# Patient Record
Sex: Female | Born: 1940 | Race: White | Hispanic: No | Marital: Married | Smoking: Never smoker
Health system: Southern US, Community
[De-identification: ages and names within clinical notes are randomized; demographics above are authoritative.]

## PROBLEM LIST (undated history)

## (undated) DIAGNOSIS — D649 Anemia, unspecified: Secondary | ICD-10-CM

## (undated) DIAGNOSIS — K8020 Calculus of gallbladder without cholecystitis without obstruction: Secondary | ICD-10-CM

## (undated) DIAGNOSIS — E785 Hyperlipidemia, unspecified: Secondary | ICD-10-CM

## (undated) DIAGNOSIS — K219 Gastro-esophageal reflux disease without esophagitis: Secondary | ICD-10-CM

## (undated) DIAGNOSIS — E079 Disorder of thyroid, unspecified: Secondary | ICD-10-CM

## (undated) DIAGNOSIS — I872 Venous insufficiency (chronic) (peripheral): Secondary | ICD-10-CM

## (undated) DIAGNOSIS — K838 Other specified diseases of biliary tract: Secondary | ICD-10-CM

## (undated) DIAGNOSIS — F419 Anxiety disorder, unspecified: Secondary | ICD-10-CM

## (undated) DIAGNOSIS — E039 Hypothyroidism, unspecified: Secondary | ICD-10-CM

## (undated) DIAGNOSIS — I10 Essential (primary) hypertension: Secondary | ICD-10-CM

## (undated) DIAGNOSIS — K8689 Other specified diseases of pancreas: Secondary | ICD-10-CM

## (undated) DIAGNOSIS — K760 Fatty (change of) liver, not elsewhere classified: Secondary | ICD-10-CM

## (undated) DIAGNOSIS — G709 Myoneural disorder, unspecified: Secondary | ICD-10-CM

## (undated) DIAGNOSIS — K869 Disease of pancreas, unspecified: Secondary | ICD-10-CM

## (undated) DIAGNOSIS — M6530 Trigger finger, unspecified finger: Secondary | ICD-10-CM

## (undated) DIAGNOSIS — I81 Portal vein thrombosis: Secondary | ICD-10-CM

## (undated) DIAGNOSIS — M1990 Unspecified osteoarthritis, unspecified site: Secondary | ICD-10-CM

## (undated) DIAGNOSIS — T7840XA Allergy, unspecified, initial encounter: Secondary | ICD-10-CM

## (undated) DIAGNOSIS — N1330 Unspecified hydronephrosis: Secondary | ICD-10-CM

## (undated) DIAGNOSIS — C801 Malignant (primary) neoplasm, unspecified: Secondary | ICD-10-CM

## (undated) HISTORY — DX: Essential (primary) hypertension: I10

## (undated) HISTORY — DX: Venous insufficiency (chronic) (peripheral): I87.2

## (undated) HISTORY — DX: Portal vein thrombosis: I81

## (undated) HISTORY — DX: Hyperlipidemia, unspecified: E78.5

## (undated) HISTORY — DX: Other specified diseases of pancreas: K86.89

## (undated) HISTORY — DX: Other specified diseases of biliary tract: K83.8

## (undated) HISTORY — DX: Fatty (change of) liver, not elsewhere classified: K76.0

## (undated) HISTORY — DX: Trigger finger, unspecified finger: M65.30

## (undated) HISTORY — DX: Unspecified hydronephrosis: N13.30

## (undated) HISTORY — PX: BREAST BIOPSY: SHX20

## (undated) HISTORY — DX: Disease of pancreas, unspecified: K86.9

## (undated) HISTORY — DX: Disorder of thyroid, unspecified: E07.9

## (undated) HISTORY — DX: Malignant (primary) neoplasm, unspecified: C80.1

## (undated) HISTORY — DX: Morbid (severe) obesity due to excess calories: E66.01

## (undated) HISTORY — PX: LUMBAR LAMINECTOMY: SHX95

## (undated) HISTORY — DX: Calculus of gallbladder without cholecystitis without obstruction: K80.20

## (undated) HISTORY — DX: Gastro-esophageal reflux disease without esophagitis: K21.9

---

## 1997-11-17 ENCOUNTER — Encounter: Payer: Self-pay | Admitting: Urology

## 1997-11-17 ENCOUNTER — Observation Stay (HOSPITAL_COMMUNITY): Admission: EM | Admit: 1997-11-17 | Discharge: 1997-11-18 | Payer: Self-pay | Admitting: Emergency Medicine

## 1997-11-26 ENCOUNTER — Encounter: Payer: Self-pay | Admitting: Urology

## 1997-11-26 ENCOUNTER — Ambulatory Visit (HOSPITAL_COMMUNITY): Admission: RE | Admit: 1997-11-26 | Discharge: 1997-11-26 | Payer: Self-pay | Admitting: Urology

## 1998-05-24 ENCOUNTER — Other Ambulatory Visit: Admission: RE | Admit: 1998-05-24 | Discharge: 1998-05-24 | Payer: Self-pay | Admitting: *Deleted

## 1999-04-21 ENCOUNTER — Ambulatory Visit (HOSPITAL_COMMUNITY): Admission: RE | Admit: 1999-04-21 | Discharge: 1999-04-21 | Payer: Self-pay | Admitting: Internal Medicine

## 1999-07-04 ENCOUNTER — Encounter: Admission: RE | Admit: 1999-07-04 | Discharge: 1999-10-02 | Payer: Self-pay | Admitting: Internal Medicine

## 1999-07-08 ENCOUNTER — Other Ambulatory Visit: Admission: RE | Admit: 1999-07-08 | Discharge: 1999-07-08 | Payer: Self-pay | Admitting: *Deleted

## 1999-12-21 ENCOUNTER — Encounter: Admission: RE | Admit: 1999-12-21 | Discharge: 2000-03-20 | Payer: Self-pay | Admitting: Internal Medicine

## 2000-07-25 ENCOUNTER — Other Ambulatory Visit: Admission: RE | Admit: 2000-07-25 | Discharge: 2000-07-25 | Payer: Self-pay | Admitting: *Deleted

## 2000-07-25 ENCOUNTER — Encounter (INDEPENDENT_AMBULATORY_CARE_PROVIDER_SITE_OTHER): Payer: Self-pay

## 2000-09-17 ENCOUNTER — Encounter: Admission: RE | Admit: 2000-09-17 | Discharge: 2000-12-16 | Payer: Self-pay | Admitting: Internal Medicine

## 2000-12-26 ENCOUNTER — Encounter: Admission: RE | Admit: 2000-12-26 | Discharge: 2001-03-26 | Payer: Self-pay | Admitting: Internal Medicine

## 2001-08-14 ENCOUNTER — Other Ambulatory Visit: Admission: RE | Admit: 2001-08-14 | Discharge: 2001-08-14 | Payer: Self-pay | Admitting: *Deleted

## 2001-12-16 ENCOUNTER — Encounter: Admission: RE | Admit: 2001-12-16 | Discharge: 2001-12-16 | Payer: Self-pay | Admitting: Neurosurgery

## 2001-12-16 ENCOUNTER — Encounter: Payer: Self-pay | Admitting: Neurosurgery

## 2002-08-28 ENCOUNTER — Other Ambulatory Visit: Admission: RE | Admit: 2002-08-28 | Discharge: 2002-08-28 | Payer: Self-pay | Admitting: *Deleted

## 2002-11-17 ENCOUNTER — Encounter: Admission: RE | Admit: 2002-11-17 | Discharge: 2003-02-15 | Payer: Self-pay | Admitting: Internal Medicine

## 2003-10-08 ENCOUNTER — Other Ambulatory Visit: Admission: RE | Admit: 2003-10-08 | Discharge: 2003-10-08 | Payer: Self-pay | Admitting: *Deleted

## 2004-01-04 ENCOUNTER — Ambulatory Visit: Payer: Self-pay | Admitting: Internal Medicine

## 2004-01-08 ENCOUNTER — Ambulatory Visit: Payer: Self-pay | Admitting: Internal Medicine

## 2004-04-25 ENCOUNTER — Ambulatory Visit: Payer: Self-pay | Admitting: Internal Medicine

## 2004-08-29 ENCOUNTER — Ambulatory Visit: Payer: Self-pay | Admitting: Pulmonary Disease

## 2004-08-30 ENCOUNTER — Ambulatory Visit: Payer: Self-pay | Admitting: Pulmonary Disease

## 2004-09-05 ENCOUNTER — Ambulatory Visit: Payer: Self-pay

## 2004-09-06 ENCOUNTER — Ambulatory Visit: Payer: Self-pay | Admitting: Pulmonary Disease

## 2004-09-12 ENCOUNTER — Ambulatory Visit: Payer: Self-pay | Admitting: Internal Medicine

## 2004-09-14 ENCOUNTER — Ambulatory Visit: Payer: Self-pay | Admitting: Cardiology

## 2004-09-16 ENCOUNTER — Encounter: Admission: RE | Admit: 2004-09-16 | Discharge: 2004-12-15 | Payer: Self-pay | Admitting: Internal Medicine

## 2004-09-20 ENCOUNTER — Inpatient Hospital Stay (HOSPITAL_BASED_OUTPATIENT_CLINIC_OR_DEPARTMENT_OTHER): Admission: RE | Admit: 2004-09-20 | Discharge: 2004-09-20 | Payer: Self-pay | Admitting: Cardiology

## 2004-09-20 ENCOUNTER — Ambulatory Visit: Payer: Self-pay | Admitting: Cardiology

## 2004-09-27 ENCOUNTER — Ambulatory Visit: Payer: Self-pay | Admitting: Internal Medicine

## 2004-10-05 ENCOUNTER — Ambulatory Visit: Payer: Self-pay | Admitting: Cardiology

## 2004-10-25 ENCOUNTER — Ambulatory Visit: Payer: Self-pay | Admitting: Internal Medicine

## 2004-11-04 ENCOUNTER — Ambulatory Visit: Payer: Self-pay | Admitting: Internal Medicine

## 2004-11-25 ENCOUNTER — Other Ambulatory Visit: Admission: RE | Admit: 2004-11-25 | Discharge: 2004-11-25 | Payer: Self-pay | Admitting: *Deleted

## 2004-12-08 ENCOUNTER — Ambulatory Visit: Payer: Self-pay | Admitting: Internal Medicine

## 2005-01-20 ENCOUNTER — Ambulatory Visit: Payer: Self-pay | Admitting: Internal Medicine

## 2005-02-27 ENCOUNTER — Ambulatory Visit: Payer: Self-pay | Admitting: Pulmonary Disease

## 2005-03-31 ENCOUNTER — Ambulatory Visit: Payer: Self-pay | Admitting: Internal Medicine

## 2005-06-02 ENCOUNTER — Ambulatory Visit: Payer: Self-pay | Admitting: Internal Medicine

## 2005-07-11 ENCOUNTER — Encounter: Admission: RE | Admit: 2005-07-11 | Discharge: 2005-07-11 | Payer: Self-pay | Admitting: *Deleted

## 2005-07-12 ENCOUNTER — Ambulatory Visit: Payer: Self-pay | Admitting: Internal Medicine

## 2005-07-14 ENCOUNTER — Encounter: Admission: RE | Admit: 2005-07-14 | Discharge: 2005-07-14 | Payer: Self-pay | Admitting: *Deleted

## 2005-10-04 ENCOUNTER — Ambulatory Visit: Payer: Self-pay | Admitting: Internal Medicine

## 2005-11-10 ENCOUNTER — Ambulatory Visit: Payer: Self-pay | Admitting: Internal Medicine

## 2005-12-13 ENCOUNTER — Ambulatory Visit: Payer: Self-pay | Admitting: Internal Medicine

## 2005-12-29 ENCOUNTER — Ambulatory Visit: Payer: Self-pay | Admitting: Internal Medicine

## 2006-01-05 ENCOUNTER — Ambulatory Visit: Payer: Self-pay | Admitting: Internal Medicine

## 2006-03-09 ENCOUNTER — Ambulatory Visit: Payer: Self-pay | Admitting: Internal Medicine

## 2006-04-13 ENCOUNTER — Ambulatory Visit: Payer: Self-pay | Admitting: Internal Medicine

## 2006-04-13 LAB — CONVERTED CEMR LAB
Alkaline Phosphatase: 66 units/L (ref 39–117)
Cholesterol: 231 mg/dL (ref 0–200)
HDL: 41.6 mg/dL (ref >39.0)
Triglycerides: 224 mg/dL (ref 0–149)
VLDL: 45 mg/dL — ABNORMAL HIGH (ref 0–40)

## 2006-06-20 ENCOUNTER — Encounter: Admission: RE | Admit: 2006-06-20 | Discharge: 2006-06-20 | Payer: Self-pay | Admitting: Internal Medicine

## 2006-07-26 ENCOUNTER — Ambulatory Visit: Payer: Self-pay | Admitting: Internal Medicine

## 2006-07-26 LAB — CONVERTED CEMR LAB
AST: 23 units/L (ref 0–37)
Albumin: 4 g/dL (ref 3.5–5.2)
CRP, High Sensitivity: 1 (ref 0.00–5.00)
Hgb A1c MFr Bld: 7.2 % — ABNORMAL HIGH (ref 4.6–6.0)
LDL Cholesterol: 89 mg/dL (ref 0–99)
Total Protein: 6.7 g/dL (ref 6.0–8.3)

## 2006-08-01 ENCOUNTER — Encounter: Admission: RE | Admit: 2006-08-01 | Discharge: 2006-08-01 | Payer: Self-pay | Admitting: Internal Medicine

## 2006-09-27 ENCOUNTER — Ambulatory Visit: Payer: Self-pay | Admitting: Internal Medicine

## 2006-10-04 ENCOUNTER — Ambulatory Visit: Payer: Self-pay

## 2006-12-14 ENCOUNTER — Ambulatory Visit (HOSPITAL_BASED_OUTPATIENT_CLINIC_OR_DEPARTMENT_OTHER): Admission: RE | Admit: 2006-12-14 | Discharge: 2006-12-14 | Payer: Self-pay | Admitting: Orthopedic Surgery

## 2006-12-24 DIAGNOSIS — M6530 Trigger finger, unspecified finger: Secondary | ICD-10-CM | POA: Insufficient documentation

## 2006-12-24 DIAGNOSIS — K219 Gastro-esophageal reflux disease without esophagitis: Secondary | ICD-10-CM

## 2006-12-24 DIAGNOSIS — E119 Type 2 diabetes mellitus without complications: Secondary | ICD-10-CM

## 2006-12-24 DIAGNOSIS — I872 Venous insufficiency (chronic) (peripheral): Secondary | ICD-10-CM | POA: Insufficient documentation

## 2006-12-24 DIAGNOSIS — E785 Hyperlipidemia, unspecified: Secondary | ICD-10-CM

## 2006-12-27 ENCOUNTER — Ambulatory Visit: Payer: Self-pay | Admitting: Internal Medicine

## 2006-12-27 LAB — CONVERTED CEMR LAB
CO2: 33 meq/L — ABNORMAL HIGH (ref 19–32)
Creatinine, Ser: 0.5 mg/dL (ref 0.4–1.2)
GFR calc Af Amer: 159 mL/min
Glucose, Bld: 148 mg/dL — ABNORMAL HIGH (ref 70–99)
Sodium: 143 meq/L (ref 135–145)

## 2006-12-28 ENCOUNTER — Ambulatory Visit (HOSPITAL_BASED_OUTPATIENT_CLINIC_OR_DEPARTMENT_OTHER): Admission: RE | Admit: 2006-12-28 | Discharge: 2006-12-28 | Payer: Self-pay | Admitting: Orthopedic Surgery

## 2007-04-15 ENCOUNTER — Telehealth (INDEPENDENT_AMBULATORY_CARE_PROVIDER_SITE_OTHER): Payer: Self-pay | Admitting: *Deleted

## 2007-04-21 ENCOUNTER — Encounter: Admission: RE | Admit: 2007-04-21 | Discharge: 2007-04-21 | Payer: Self-pay | Admitting: Orthopedic Surgery

## 2007-05-09 ENCOUNTER — Telehealth (INDEPENDENT_AMBULATORY_CARE_PROVIDER_SITE_OTHER): Payer: Self-pay | Admitting: *Deleted

## 2007-08-19 ENCOUNTER — Encounter: Admission: RE | Admit: 2007-08-19 | Discharge: 2007-08-19 | Payer: Self-pay | Admitting: Internal Medicine

## 2007-09-16 ENCOUNTER — Encounter: Payer: Self-pay | Admitting: Internal Medicine

## 2007-10-18 ENCOUNTER — Ambulatory Visit: Payer: Self-pay | Admitting: Internal Medicine

## 2007-10-18 DIAGNOSIS — J029 Acute pharyngitis, unspecified: Secondary | ICD-10-CM

## 2007-10-18 DIAGNOSIS — E1149 Type 2 diabetes mellitus with other diabetic neurological complication: Secondary | ICD-10-CM | POA: Insufficient documentation

## 2007-10-24 ENCOUNTER — Ambulatory Visit: Payer: Self-pay | Admitting: Internal Medicine

## 2007-10-24 DIAGNOSIS — J069 Acute upper respiratory infection, unspecified: Secondary | ICD-10-CM | POA: Insufficient documentation

## 2007-11-08 ENCOUNTER — Telehealth: Payer: Self-pay | Admitting: Adult Health

## 2007-11-20 ENCOUNTER — Ambulatory Visit: Payer: Self-pay | Admitting: Internal Medicine

## 2007-11-20 DIAGNOSIS — J209 Acute bronchitis, unspecified: Secondary | ICD-10-CM | POA: Insufficient documentation

## 2007-12-17 ENCOUNTER — Encounter: Payer: Self-pay | Admitting: Internal Medicine

## 2008-01-14 ENCOUNTER — Ambulatory Visit: Payer: Self-pay | Admitting: Internal Medicine

## 2008-01-14 DIAGNOSIS — J984 Other disorders of lung: Secondary | ICD-10-CM

## 2008-01-14 DIAGNOSIS — G4700 Insomnia, unspecified: Secondary | ICD-10-CM | POA: Insufficient documentation

## 2008-01-14 DIAGNOSIS — I10 Essential (primary) hypertension: Secondary | ICD-10-CM | POA: Insufficient documentation

## 2008-01-28 ENCOUNTER — Telehealth (INDEPENDENT_AMBULATORY_CARE_PROVIDER_SITE_OTHER): Payer: Self-pay | Admitting: *Deleted

## 2008-02-18 ENCOUNTER — Encounter: Payer: Self-pay | Admitting: Internal Medicine

## 2008-02-18 ENCOUNTER — Ambulatory Visit: Payer: Self-pay | Admitting: Internal Medicine

## 2008-03-16 ENCOUNTER — Telehealth (INDEPENDENT_AMBULATORY_CARE_PROVIDER_SITE_OTHER): Payer: Self-pay | Admitting: *Deleted

## 2008-09-11 ENCOUNTER — Telehealth: Payer: Self-pay | Admitting: Internal Medicine

## 2008-09-21 ENCOUNTER — Encounter: Payer: Self-pay | Admitting: Internal Medicine

## 2008-10-05 ENCOUNTER — Ambulatory Visit: Payer: Self-pay | Admitting: Internal Medicine

## 2009-01-08 ENCOUNTER — Encounter: Admission: RE | Admit: 2009-01-08 | Discharge: 2009-01-08 | Payer: Self-pay | Admitting: Endocrinology

## 2009-11-03 ENCOUNTER — Encounter: Admission: RE | Admit: 2009-11-03 | Discharge: 2009-11-03 | Payer: Self-pay | Admitting: Orthopedic Surgery

## 2009-11-24 ENCOUNTER — Ambulatory Visit (HOSPITAL_BASED_OUTPATIENT_CLINIC_OR_DEPARTMENT_OTHER): Admission: RE | Admit: 2009-11-24 | Discharge: 2009-11-24 | Payer: Self-pay | Admitting: Orthopedic Surgery

## 2009-12-20 ENCOUNTER — Encounter: Payer: Self-pay | Admitting: Internal Medicine

## 2010-01-03 ENCOUNTER — Encounter: Payer: Self-pay | Admitting: Internal Medicine

## 2010-01-10 ENCOUNTER — Ambulatory Visit: Payer: Self-pay | Admitting: Internal Medicine

## 2010-01-10 ENCOUNTER — Encounter: Admission: RE | Admit: 2010-01-10 | Discharge: 2010-01-10 | Payer: Self-pay | Admitting: Internal Medicine

## 2010-01-10 DIAGNOSIS — R0609 Other forms of dyspnea: Secondary | ICD-10-CM

## 2010-01-10 DIAGNOSIS — R0989 Other specified symptoms and signs involving the circulatory and respiratory systems: Secondary | ICD-10-CM

## 2010-01-14 ENCOUNTER — Telehealth: Payer: Self-pay | Admitting: Internal Medicine

## 2010-01-31 ENCOUNTER — Ambulatory Visit: Payer: Self-pay | Admitting: Internal Medicine

## 2010-03-13 ENCOUNTER — Encounter: Payer: Self-pay | Admitting: *Deleted

## 2010-03-21 ENCOUNTER — Encounter: Payer: Self-pay | Admitting: Internal Medicine

## 2010-03-22 NOTE — Progress Notes (Signed)
Summary: COUGH/ CONGESTION/ > tramadol prn  Phone Note Call from Patient Call back at [phone]   Caller: Patient Call For: WERT Summary of Call: COUGH CONGESTION AND [facility] [phone] Initial call taken by: Rickard Patience,  January 14, 2010 10:37 AM  Follow-up for Phone Call        [id number]. Carron Curie CMA  January 14, 2010 10:48 AM  Pt saw MW on Monday 01-10-10 and was given augmentin and pred taper. Pt states she is no better in fact wheezing is worse, still SOB, and dry cough that is "constant" I advised pt she needs to finishe treatment to get full benefit, she will finsihe abx tomorrow. She states she does not want to go the weekend and still be sick and wants MW recs. Please advise.Carron Curie CMA  January 14, 2010 11:04 AM Take delsym two tsp every 12 hours and add tramadol 50 mg up to 2every 4 hours to suppress the urge to cough. Swallowing water or using ice chips/non mint and menthol containing candies (such as lifesavers or sugarless jolly ranchers) are also effective.  RX for # 40 tramadol  1-2 every 4 hours as needed  Follow-up by: Nyoka Cowden MD,  January 14, 2010 12:01 PM  Additional Follow-up for Phone Call Additional follow up Details #1::        pt advised of recs and rx sent. Carron Curie CMA  January 14, 2010 12:07 PM     New/Updated Medications: TRAMADOL HCL 50 MG TABS (TRAMADOL HCL) 1-2 tablets every 4 hours as needed Prescriptions: TRAMADOL HCL 50 MG TABS (TRAMADOL HCL) 1-2 tablets every 4 hours as needed  #40 x 0   Entered by:   Carron Curie CMA   Authorized by:   Nyoka Cowden MD   Signed by:   Carron Curie CMA on 01/14/2010   Method used:   Electronically to        [facility]* (retail)       [address]       [place], [place]  [address]       Ph: [phone]       Fax: [phone]   RxID:   [id number]

## 2010-03-22 NOTE — Letter (Signed)
Summary: [facility]   Imported By: Lester Arbovale 01/14/2010 11:10:42  _____________________________________________________________________  External Attachment:    Type:   Image     Comment:   External Document

## 2010-03-22 NOTE — Assessment & Plan Note (Signed)
Summary: Pulmonary/ acute ext ov for sob and new sinus   Primary Provider/Referring Provider:  [name]  CC:  c/o cough-dry x 3-4 days, increased SOB, sorethroat, wheezing, and no fcs.  History of Present Illness: 59 yowf  never smoker with morbid obesity complicated by diabetes and hyperlipidemia.  Her diabetes in turn has been complicated by painful paresthesias for which she has been treated previously with Lyric 75 in am 150 in pm  with adequate control  followed by Dr. Omer Jack, Endocrinology.   November 20, 2007--ov for  cough with talking, laughing and bedtime. Mainly dry, resolved with tessilon and then dc'd without relapse, cxr read by radiology as possible rul nodule.  January 14, 2008 ov multiple issues 1. 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 Sleeping worse more due to restless than paresthesia 3  Left hip pain Bednarz.  October 05, 2008 ov needs trazadone at  100mg  2 qm with option of 3 if needed (never does) no problem with children around her meds though she has 5 grand children now and doing lots of babysitting. denies depression and says sleeps great on the trazadone with good control of paresthesias on lyrica and vitamins.  rec no change in rx  January 10, 2010 cc  sob x 6 months with ex  and also with lying down  cough-dry x 3-4 days,increased SOB,sorethroat,wheezing, no fcs but stuffy nose, no purulent secretions. Pt denies any significant  dysphagia, itching, sneezing,   fever, chills, sweats, unintended wt loss, pleuritic or exertional cp, hempoptysis,  or change in chronic dep  leg swelling sym bilaterally  Preventive Screening-Counseling & Management  Alcohol-Tobacco     Smoking Status: never  Current Medications (verified): 1)  Nexium 40 Mg  Cpdr (Esomeprazole Magnesium) .... Take 1 Every Morning Before Breakfast 2)  Synthroid 88 Mcg Tabs (Levothyroxine Sodium) .... Take 1 Tablet By Mouth Every Day 3)  Klor-Con M20 20 Meq Cr-Tabs (Potassium Chloride  Crys Cr) .... Take 1 Tablet By Mouth Once A Day 4)  Benicar 40 Mg Tabs (Olmesartan Medoxomil) .... Take 1/2 Tablet By Mouth Every Morning 5)  Actos 45 Mg Tabs (Pioglitazone Hcl) .... Take 1 Tablet By Mouth Once A Day 6)  Biotin Maximum Strength 5000 Mcg Caps (Biotin) .... Take 1 Capsule By Mouth Once A Day 7)  Zocor 40 Mg  Tabs (Simvastatin) .... Take 1 By Mouth At Bedtime 8)  Furosemide 20 Mg  Tabs (Furosemide) .... Once Daily 9)  Trazodone Hcl 100 Mg Tabs (Trazodone Hcl) .... Take 2 At Bedtime 10)  Janumet 50-1000 Mg Tabs (Sitagliptin-Metformin Hcl) .... Take 1 Tablet By Mouth Two Times A Day 11)  Tekturna 300 Mg Tabs (Aliskiren Fumarate) .... Take 1 Tablet By Mouth Every Morning 12)  Claritin 10 Mg Tabs (Loratadine) .... As Needed 13)  Lyrica 75 Mg Caps (Pregabalin) .Marland Kitchen.. 1 Every Morning and 2 By Mouth At Bedtime 14)  Doxazosin Mesylate 8 Mg Tabs (Doxazosin Mesylate) .Marland Kitchen.. 1 Every Am 15)  Lexapro 10 Mg Tabs (Escitalopram Oxalate) .Marland Kitchen.. 1 Once Daily 16)  Clonazepam 0.5 Mg Tabs (Clonazepam) .... 1/2 At Bedtime 17)  Aspirin 81 Mg Tbec (Aspirin) .Marland Kitchen.. 1 Every Am 18)  Tylenol 325 Mg Tabs (Acetaminophen) .... Per Bottle Directions As Needed  Allergies (verified): 1)  ! * Zpak 2)  ! 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) HYPERLIPIDEMIA (ICD-272.4)..........................................Marland KitchenGegick DM (ICD-250.00)..............................................................Marland KitchenGegick GERD (ICD-530.81) VENOUS INSUFFICIENCY, CHRONIC (ICD-459.81) OBESITY, MORBID (ICD-278.01)  Health Maintenance............................................................Marland KitchenGegick     -DT  11/06     -Pneumovax 11/06     -DEXA  01/22/06 nl, repeat 02/16/08 also  nl     -CPX January 14, 2008   Vital Signs:  Patient profile:   70 year old female Height:      66 inches Weight:      250.25 pounds BMI:     40.54 O2 Sat:      95 % on Room air Temp:     98.1 degrees F  oral Pulse rate:   76 / minute BP sitting:   144 / 74  (left arm) Cuff size:   large  Vitals Entered By: Elray Buba RN (January 10, 2010 11:03 AM)  O2 Flow:  Room air CC: c/o cough-dry x 3-4 days,increased SOB,sorethroat,wheezing, no fcs Is Patient Diabetic? Yes Comments Medications reviewed with patient ,phone # verfied.Elray Buba RN  January 10, 2010 11:04 AM    Physical Exam  Additional Exam:  wt   218 January 14, 2008 > 229 October 05, 2008  > 250 January 10, 2010  Ambulatory mod obese wf  in no acute distress though with obvious severe nasal congestion HEENT: nl dentition,  and orophanx. Nl external ear canals without cough reflex, severe turbinate edema with mucoid discharge Neck without JVD/Nodes/TM/ no bruits Lungs clear to A and P bilaterally without cough on insp or exp maneuvers RRR no s3 or murmur or increase in P2 or displacement PMI,  no edema Abd soft and benign with nl excursion in the supine position. No bruits or organomegaly Ext warm no rash   Impression & Recommendations:  Problem # 1:  DYSPNEA (ICD-786.09) Chronic x months assoc with wt gain and leg swelling but nl cxr and bnp ? could this be an actos effect, consider d/c per Dr Dagoberto Ligas  Problem # 2:  UPPER RESPIRATORY INFECTION, ACUTE (ICD-465.9)  Her updated medication list for this problem includes:    Claritin 10 Mg Tabs (Loratadine) .Marland Kitchen... As needed    Aspirin 81 Mg Tbec (Aspirin) .Marland Kitchen... 1 every am    Tylenol 325 Mg Tabs (Acetaminophen) .Marland Kitchen... Per bottle directions as needed  Assoc with severe nasal congestion and ? sinusitis;  Explained natural h/o uri and why it's necessary in patients at risk to rx short term with PPI to reduce risk of evolving cyclical cough triggered by epithelial injury and a heightened sensitivty to the effects of any upper airway irritants,  most importantly acid - related  See instructions for specific recommendations     Medications Added to Medication List This  Visit: 1)  Nexium 40 Mg Cpdr (Esomeprazole magnesium) .... Take  one 30-60 min before first meal of the day 2)  Synthroid 88 Mcg Tabs (Levothyroxine sodium) .... Take 1 tablet by mouth every day 3)  Benicar 40 Mg Tabs (Olmesartan medoxomil) .... Take 1/2 tablet by mouth every morning 4)  Doxazosin Mesylate 8 Mg Tabs (Doxazosin mesylate) .Marland Kitchen.. 1 every am 5)  Lexapro 10 Mg Tabs (Escitalopram oxalate) .Marland Kitchen.. 1 once daily 6)  Clonazepam 0.5 Mg Tabs (Clonazepam) .... 1/2 at bedtime 7)  Aspirin 81 Mg Tbec (Aspirin) .Marland Kitchen.. 1 every am 8)  Tylenol 325 Mg Tabs (Acetaminophen) .... Per bottle directions as needed 9)  Pepcid 20 Mg Tabs (Famotidine) .... Take one by mouth at bedtime 10)  Prednisone 10 Mg Tabs (Prednisone) .... 4 each am x 2days, 2x2days, 1x2days and stop 11)  Augmentin 875-125 Mg Tabs (Amoxicillin-pot clavulanate) .... One twice daily with large glass of water  Other Orders: TLB-BNP (B-Natriuretic Peptide) (83880-BNPR) T-2 View CXR (71020TC) Est. Patient Level IV (11914)  Patient Instructions: 1)  GERD (REFLUX)  is a common cause of respiratory symptoms. It commonly presents without heartburn and can be treated with medication, but also with lifestyle changes including avoidance of late meals, excessive alcohol, smoking cessation, and avoid fatty foods, chocolate, peppermint, colas, red wine, and acidic juices such as orange juice. NO MINT OR MENTHOL PRODUCTS SO NO COUGH DROPS  2)  USE SUGARLESS CANDY INSTEAD (jolley ranchers)  3)  NO OIL BASED VITAMINS 4)  Augmentin one twice daily x 7days 5)  Add pepcid 20mg  one at bedtime 6)  Prednisone 10 mg 4 each am x 2days, 2x2days, 1x2days and stop  Prescriptions: AUGMENTIN 875-125 MG  TABS (AMOXICILLIN-POT CLAVULANATE) one twice daily with large glass of water  #14 x 0   Entered and Authorized by:   Nyoka Cowden MD   Signed by:   Nyoka Cowden MD on 01/10/2010   Method used:   Electronically to        [facility]* (retail)       [address]       [place], [place]  [address]       Ph: [phone]       Fax: [phone]   RxID:   [id number] PREDNISONE 10 MG  TABS (PREDNISONE) 4 each am x 2days, 2x2days, 1x2days and stop  #14 x 0   Entered and Authorized by:   Nyoka Cowden MD   Signed by:   Nyoka Cowden MD on 01/10/2010   Method used:   Electronically to        [facility]* (retail)       [address]       [place], [place]  [address]       Ph: [phone]       Fax: [phone]   RxID:   [id number]

## 2010-03-22 NOTE — Letter (Signed)
Summary: [facility]   Imported By: Lennie Odor 12/30/2009 10:59:17  _____________________________________________________________________  External Attachment:    Type:   Image     Comment:   External Document

## 2010-03-24 NOTE — Assessment & Plan Note (Signed)
Summary: Pulmonary/ ext f/u ov for sob/ cough   Primary Provider/Referring Provider:  [name]  CC:  SOB and Cough- some better.  History of Present Illness: 70 yowf  never smoker with morbid obesity complicated by diabetes and hyperlipidemia.  Her diabetes in turn has been complicated by painful paresthesias for which she has been treated previously with Lyric 75 in am 150 in pm  with adequate control  followed by Dr. Omer Jack, Endocrinology.   November 20, 2007--ov for  cough with talking, laughing and bedtime. Mainly dry, resolved with tessilon and then dc'd without relapse, cxr read by radiology as possible rul nodule.  January 14, 2008 ov multiple issues 1. 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 Sleeping worse more due to restless than paresthesia 3  Left hip pain Bednarz.  October 05, 2008 ov needs trazadone at  100mg  2 qm with option of 3 if needed (never does) no problem with children around her meds though she has 5 grand children now and doing lots of babysitting. denies depression and says sleeps great on the trazadone with good control of paresthesias on lyrica and vitamins.  rec no change in rx  January 10, 2010 cc  sob x 6 months with ex  and also with lying down  cough-dry x 3-4 days,increased SOB,sorethroat,wheezing, no fcs but stuffy nose, no purulent secretions.  rec Augmentin one twice daily x 7days > mucus turned clear Add pepcid 20mg  one at bedtime plus gerd diet Prednisone 10 mg 4 each am x 2days, 2x2days, 1x2days and stop > cough better   January 31, 2010 cc doe x  bending over,  limited by feet walking, can only do stationery bike x 10 min whereas used to do it x 30 at same speed and resistance. cough ok control without suppressants. Pt denies any significant sore throat, dysphagia, itching, sneezing,  nasal congestion or excess secretions,  fever, chills, sweats, unintended wt loss, pleuritic or exertional cp, hempoptysis,variability  in activity tolerance   orthopnea pnd or leg swelling. Pt also denies any obvious fluctuation in symptoms with weather or environmental change or other alleviating or aggravating factors.       Current Medications (verified): 1)  Nexium 40 Mg  Cpdr (Esomeprazole Magnesium) .... Take  One 30-60 Min Before First Meal of The Day 2)  Synthroid 88 Mcg Tabs (Levothyroxine Sodium) .... Take 1 Tablet By Mouth Every Day 3)  Klor-Con M20 20 Meq Cr-Tabs (Potassium Chloride Crys Cr) .... Take 1 Tablet By Mouth Once A Day 4)  Benicar 40 Mg Tabs (Olmesartan Medoxomil) .... Take 1/2 Tablet By Mouth Every Morning 5)  Actos 45 Mg Tabs (Pioglitazone Hcl) .... Take 1 Tablet By Mouth Once A Day 6)  Biotin Maximum Strength 5000 Mcg Caps (Biotin) .... Take 1 Capsule By Mouth Once A Day 7)  Zocor 40 Mg  Tabs (Simvastatin) .... Take 1 By Mouth At Bedtime 8)  Furosemide 20 Mg  Tabs (Furosemide) .... Once Daily 9)  Trazodone Hcl 100 Mg Tabs (Trazodone Hcl) .... Take 2 At Bedtime 10)  Janumet 50-1000 Mg Tabs (Sitagliptin-Metformin Hcl) .... Take 1 Tablet By Mouth Two Times A Day 11)  Tekturna 300 Mg Tabs (Aliskiren Fumarate) .... Take 1 Tablet By Mouth Every Morning 12)  Claritin 10 Mg Tabs (Loratadine) .... As Needed 13)  Lyrica 75 Mg Caps (Pregabalin) .Marland Kitchen.. 1 Every Morning and 2 By Mouth At Bedtime 14)  Doxazosin Mesylate 8 Mg Tabs (Doxazosin Mesylate) .Marland Kitchen.. 1 Every Am 15)  Lexapro  10 Mg Tabs (Escitalopram Oxalate) .Marland Kitchen.. 1 Once Daily 16)  Clonazepam 0.5 Mg Tabs (Clonazepam) .... 1/2 At Bedtime 17)  Aspirin 81 Mg Tbec (Aspirin) .Marland Kitchen.. 1 Every Am 18)  Tylenol 325 Mg Tabs (Acetaminophen) .... Per Bottle Directions As Needed 19)  Pepcid 20 Mg Tabs (Famotidine) .... Take One By Mouth At Bedtime 20)  Tramadol Hcl 50 Mg Tabs (Tramadol Hcl) .Marland Kitchen.. 1-2 Tablets At Bedtime As Needed For Cough  Allergies (verified): 1)  ! * Zpak 2)  ! 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) HYPERLIPIDEMIA  (ICD-272.4)..........................................Marland KitchenGegick DM (ICD-250.00)..............................................................Marland KitchenGegick GERD (ICD-530.81) VENOUS INSUFFICIENCY, CHRONIC (ICD-459.81) OBESITY, MORBID (ICD-278.01)  Health Maintenance............................................................Marland KitchenGegick     -DT  11/06     -Pneumovax 11/06     -DEXA  01/22/06 nl, repeat 02/16/08 also  nl  Vital Signs:  Patient profile:   70 year old female Weight:      243 pounds O2 Sat:      94 % on Room air Temp:     98.0 degrees F oral Pulse rate:   74 / minute BP sitting:   148 / 70  (left arm)  Vitals Entered By: Vernie Murders (January 31, 2010 9:56 AM)  O2 Flow:  Room air   Physical Exam  Additional Exam:  wt   218 January 14, 2008 > 229 October 05, 2008  > 250 January 10, 2010 > 243 January 31, 2010  Ambulatory mod obese wf  in no acute distress   HEENT: nl dentition,  and orophanx. Nl external ear canals without cough reflex, mild bilateral nasal turbinate swelling, no discharge Neck without JVD/Nodes/TM/ no bruits Lungs clear to A and P bilaterally without cough on insp or exp maneuvers RRR no s3 or murmur or increase in P2 or displacement PMI,  no edema Abd soft and benign with nl excursion in the supine position. No bruits or organomegaly Ext warm no rash  BNP 01/10/10  = 146  CXR  Procedure date:  01/10/2010  Findings:      Comparison: Chest 01/01/2019 4:09.   Findings: Heart size is normal.  Peribronchial thickening is unchanged.  No focal airspace disease or effusion.   IMPRESSION: No acute finding.  Stable compared to prior exam.    Impression & Recommendations:  Problem # 1:  DYSPNEA (ICD-786.09)  Her updated medication list for this problem includes:    Furosemide 20 Mg Tabs (Furosemide) ..... Once daily  Chronic x months assoc with wt gain and leg swelling but nl cxr and bnp ? could this be an actos effect, consider d/c per Dr  Dagoberto Ligas  Obseity with decondtioning is the most likely explanation.  Discussed in detail all the  indications, usual  risks and alternatives  relative to the benefits with patient who agrees to proceed with trial of paced exercise then consider return for cpst p holidays  Orders: Est. Patient Level IV (62130)  Problem # 2:  GERD (ICD-530.81)  Her updated medication list for this problem includes:    Nexium 40 Mg Cpdr (Esomeprazole magnesium) .Marland Kitchen... Take  one 30-60 min before first meal of the day    Pepcid 20 Mg Tabs (Famotidine) .Marland Kitchen... Take one by mouth at bedtime  cough resolved with rx of GERd.  Of the three most common causes of chronic cough, only one (GERD) can actually cause the other two and perpetuate the cylce of cough inducing airway trauma, inflammation, heightened sensitivity to reflux which is prompted by the cough  itself via a cyclical mechanism.  This may partially respond to steroids and look like asthma and post nasal drainage but never erradicated completely unless the cough and the secondary reflux are eliminated, preferably both at the same time.  rec maint rx discussed  Orders: Est. Patient Level IV (42706)  Medications Added to Medication List This Visit: 1)  Tramadol Hcl 50 Mg Tabs (Tramadol hcl) .Marland Kitchen.. 1-2 tablets at bedtime as needed for cough 2)  Famvir 500 Mg Tabs (Famciclovir) .... One twice daily as needed  Patient Instructions: 1)  GERD (REFLUX)  is a common cause of respiratory symptoms. It commonly presents without heartburn and can be treated with medication, but also with lifestyle changes including avoidance of late meals, excessive alcohol, smoking cessation, and avoid fatty foods, chocolate, peppermint, colas, red wine, and acidic juices such as orange juice. NO MINT OR MENTHOL PRODUCTS SO NO COUGH DROPS  2)  USE SUGARLESS CANDY INSTEAD (jolley ranchers)  3)  NO OIL BASED VITAMINS 4)  Get on  bike daily x 30 min pace it where you are slightly short of breath  but never out of breath and if not imroving after 2 weeks call [facility] at [phone] for CPST  5)  In meantime try off Actos if possible 6)  Famvir 500 one twice daily x 5 days if bad fever blister develops Prescriptions: FAMVIR 500 MG TABS (FAMCICLOVIR) one twice daily as needed  #10 x 11   Entered and Authorized by:   Nyoka Cowden MD   Signed by:   Nyoka Cowden MD on 01/31/2010   Method used:   Electronically to        [facility]* (retail)       [address]       [place], [place]  [address]       Ph: [phone]       Fax: [phone]   RxID:   [id number]

## 2010-03-24 NOTE — Letter (Signed)
Summary: [facility]  [facility]   Imported By: Sherian Rein 02/07/2010 14:01:16  _____________________________________________________________________  External Attachment:    Type:   Image     Comment:   External Document

## 2010-04-13 NOTE — Letter (Signed)
Summary: Sherri Rad MD  Sherri Rad MD   Imported By: Lester Wilkeson 04/06/2010 07:33:48  _____________________________________________________________________  External Attachment:    Type:   Image     Comment:   External Document

## 2010-05-05 LAB — POCT I-STAT 4, (NA,K, GLUC, HGB,HCT)
Glucose, Bld: 126 mg/dL — ABNORMAL HIGH (ref 70–99)
HCT: 44 % (ref 36.0–46.0)

## 2010-05-05 LAB — GLUCOSE, CAPILLARY: Glucose-Capillary: 123 mg/dL — ABNORMAL HIGH (ref 70–99)

## 2010-05-23 ENCOUNTER — Other Ambulatory Visit: Payer: Self-pay | Admitting: *Deleted

## 2010-05-23 NOTE — Telephone Encounter (Signed)
Last filled 08/27/2009.  Last OV 01/31/2010.  No future appts pending. Please advise for refills.

## 2010-05-23 NOTE — Telephone Encounter (Signed)
Ok to reflll x 1 year

## 2010-05-24 MED ORDER — TRAZODONE HCL 100 MG PO TABS: ORAL_TABLET | ORAL | Status: DC

## 2010-06-01 ENCOUNTER — Telehealth: Payer: Self-pay | Admitting: Internal Medicine

## 2010-06-01 NOTE — Telephone Encounter (Signed)
Forwarded to Dr. [name] for review.

## 2010-07-05 NOTE — Op Note (Signed)
NAMELACRESHA, FUSILIER                 ACCOUNT NO.:  [id number]   MEDICAL RECORD NO.:  [id number]           PATIENT TYPE:   LOCATION:                                 FACILITY:   PHYSICIAN:  Katy Fitch. Sypher, M.D. 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   OPERATION:  Release of right thumb A1 pulley under local anesthesia  sedation.   OPERATING SURGEON:  Katy Fitch. Sypher, M.D.   ASSISTANT:  Molly Maduro Dasnoit PA-C.   ANESTHESIA:  2% lidocaine flexor sheath block and metacarpal head-level  block of right thumb supplemented by IV sedation, supervising  anesthesiologist is W. Autumn Patty, MD.   INDICATIONS:  Donnis Pecha is a 69 year old woman referred by Dr. [name]  for evaluation and management of trigger thumbs.  She had release of her  left thumb A1 pulley and injection of the right thumb approximately 1  month prior.  She now presents for release of her right thumb A1 pulley.  After informed consent, she is brought to the operating room at this  time.   PROCEDURE:  Flossie Wexler is brought to the operating room and placed in  supine position upon the operating table.  Following sedation, the right  arm was prepped with Betadine soap and solution and sterilely draped.  A  pneumatic tourniquet was applied to the proximal right brachium.   Lidocaine 2% was infiltrated in the path of the intended incision and  into the right thumb flexor sheath.  After a few moments, anesthesia was  satisfactory.  The arm was then exsanguinated with an Esmarch bandage  and the arterial tourniquet inflated to 230 mmHg.  The procedure  commenced with a short transverse incision directly over the A1 pulley.  The subcutaneous tissues were carefully divided  taking care to identify  and retract the radial proper digital nerve of the thumb.  After  isolation of the A1 pulley, the pulley was split with scalpel and  scissors.  The flexor pollicis longus tendon was delivered and found be  intact.  Thereafter free range of motion of the IP joint was recovered.   The wound was then repaired with mattress suture of 5-0 nylon.  A  compressive dressing was applied with Xeroflo, sterile gauze and an Ace  wrap.  Ms. Scoles will be discharged home to the care of her family.  She is provided with a prescription for Darvocet-N 100 one p.o. q.4-6h.  p.r.n. pain, 20 tablets without refill.   We will see her back in follow-up in 1 week or sooner p.r.n. problems.      Katy Fitch Sypher, M.D.  Electronically Signed     RVS/MEDQ  D:  12/28/2006  T:  12/28/2006  Job:  [id number]   cc:   [name]. [name], MD, FCCP

## 2010-07-05 NOTE — Op Note (Signed)
NAMEKAYLEAN, TUPOU                 ACCOUNT NO.:  [id number]   MEDICAL RECORD NO.:  [id number]          PATIENT TYPE:  AMB   LOCATION:  DSC                          FACILITY:  [facility]   PHYSICIAN:  Katy Fitch. Sypher, M.D. 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   OPERATION:  1. Release of left thumb A1 pulley.  2. Injection of right thumb flexor sheath at A1 pulley.   OPERATING SURGEON:  Katy Fitch. Sypher, MD   ASSISTANT:  Annye Rusk PA-C   ANESTHESIA:  2% lidocaine metacarpal head-level block of left thumb  supplemented by IV sedation, supervising anesthesiologist is Kaylyn Layer.  Michelle Piper, MD.   INDICATIONS:  Naveh Rickles is a 70 year old woman referred for evaluation  and management of bilateral trigger thumbs.  She has failed nonoperative  measures.  Due to a failure to respond, she is now brought to the  operating room for release of her left thumb A1 pulley and requests  injection of her right thumb flexor sheath.   After informed consent, she is brought to the operating room at this  time.  Preoperatively questions were invited and answered in detail.   PROCEDURE:  Brandt Loosen was brought to the operating room and placed  in supine position on the operating table.   Following IV sedation, the left thumb was prepped with Betadine and 2%  lidocaine infiltrated in the region of the metacarpal head and flexor  sheath.  Attention then directed to the right thumb.  Betadine was  painted on the volar surface of the MP joint followed by palpation and  identification of the A1 pulley.  The IP joint was placed in flexion,  the 27-gauge needle placed within the flexor sheath, the FPL brought  distally by IP joint extension and a mixture of  Depo-Medrol 40 mg/mL and  2% lidocaine infiltrated into the flexor sheath.   Good sheath distention was achieved.   This wound was then dressed with a Band-Aid.   Attention was then directed to the left hand.   The arm was prepped with Betadine soap and solution and sterilely  draped.  A pneumatic tourniquet was applied to the proximal left  brachium.  Following exsanguination of the left arm with an Esmarch  bandage, the arterial tourniquet was inflated to 230 mmHg.   The procedure commenced with a short transverse incision directly over  the palpably thickened A1 pulley.   The subcutaneous tissues were carefully divided taking care to identify  the A1 pulley.  The radial proper digital nerve and artery were  retracted with a blunt Ragnell retractor, followed by release of the  pulley with scalpel and scissors.  The flexor pollicis longus tendon was  delivered and a cartilaginous nodule identified.  Full IP motion was  recovered.   The wound was then repaired with interrupted suture of 5-0 nylon.  A compressive dressing was applied with Xeroflo, sterile gauze and Ace  wrap.   Ms. Stoker is encouraged to begin immediate range of motion excises.  She will return to see [place] in the office for follow-up in a week for  suture removal.  For aftercare she is provided a prescription for  Darvocet-N 100 one p.o. q.4-6h. p.r.n. pain, 20 tablets without refill.      Katy Fitch Sypher, M.D.  Electronically Signed     RVS/MEDQ  D:  12/14/2006  T:  12/15/2006  Job:  [id number]

## 2010-07-05 NOTE — Assessment & Plan Note (Signed)
[facility]                             PULMONARY OFFICE NOTE   LAKEITA, PANTHER                        MRN:          [id number]  DATE:07/26/2006                            DOB:          1940/09/03    PRIMARY SERVICE EXTENDED FOLLOWUP OFFICE VISIT   HISTORY:  A 70 year old white female with morbid obesity complicated by  GERD, diabetes, in turn complicated by diabetic paresthesias, and  hyperlipidemia.  She says her feet have been getting worse over the last  year despite taking Lyrica with pain at the end of the day.  It is not  typical of diabetic paresthesia pattern pain.  It is mostly where her  feet touch her shoes.  Note, she has multiple inserts already from the  foot doctor, but wanted to know what else she could do medically.  She  denies any exertional chest pain, orthopnea, PND, leg swelling, UTI, or  claudication symptoms.   For full inventory of medications, please see face sheet dated July 26, 2006, noting that Celebrex was still listed, although it has been  discontinued in favor of meloxicam 7.5 mg p.o. daily p.r.n. by Dr.  Teressa Senter.   PHYSICAL EXAMINATION:  She is a pleasant, ambulatory, obese white female  in no acute distress.  VITAL SIGNS:  Stable vital signs.  Afebrile.  Weight is 217 pounds,  which is down 7 pounds from previous visit.  Blood pressure 140/84.  HEENT:  Unremarkable.  Oropharynx is clear.  LUNGS:  Fields are completely clear bilaterally to auscultation and  percussion.  CARDIAC:  Regular rate and rhythm without murmur, gallop, or rub.  ABDOMEN:  Soft and benign.  EXTREMITIES:  Warm without calf tenderness, cyanosis, clubbing, or  edema.   Lab profile was obtained today, including a potassium and lipid profile,  and hemoglobin A1c.   IMPRESSION:  Had extended discussion with this patient lasting 15 to 20  minutes of a 25 minute visit on the following topics.  1. Morbid obesity, slowly improving under the  treatment of      nutritionist and with more regular physical activity.  However, she      has a long way to go toward a goal weight of less than 171.  2. Chronic foot pain appears to be more related to her shoes than it      is classic diabetic paresthesia.  I have recommended that she      return to her foot doctor, Dr. Wynelle Cleveland, but continue her Lyrica      in the meantime at 50 mg t.i.d.  3. Joint pain.  She has a trigger finger and has been asked to take      meloxicam 7.5 mg b.i.d.  I emphasized that once her joint pain is      better, she should reduce this down or off, and list it as a p.r.n.  4. Hyperlipidemia with target LDL less than 80 (diabetic).  Will      recheck a lipid profile today and LFTs.  She was able to tolerate  Crestor well, but complains about the cost.  Therefore, I am going      to make the substitution of Zocor 40 mg daily.  5. Complex medical regimen.  I reviewed each and every 1 of her      medicines from her      medication calendar and in fact rewrote the calendar today for her,      and then went over every medicine, both maintenance and p.r.n. to      make sure she understood the proper context for taking each      medicine.  Followup will be every 3 months, sooner if needed.     [name]. [name], MD, [facility]  Electronically Signed    MBW/MedQ  DD: 07/26/2006  DT: 07/26/2006  Job #: [id number]

## 2010-07-05 NOTE — Assessment & Plan Note (Signed)
[facility]                             PULMONARY OFFICE NOTE   Theresa Summers, Theresa Summers                        MRN:          [id number]  DATE:09/27/2006                            DOB:          1940-12-21    HISTORY:  This is a 70 year old white female with morbid obesity and  chronic venous insufficiency. She has noted several weeks of increasing  swelling, redness, and tenderness in her legs. Most of this is  anterior shin area with slightly more prominent chronic venous stasis  changes than she has previously experienced. She denies any swelling  that persists after elevating her feet. She also denies any calf pain,  but she is concerned that she may have clot.   MEDICATIONS:  Reviewed in detail on the worksheet, column dated  09/27/2006, but note that she is not taking aspirin anymore because it  causes too much bruising.   REVIEW OF SYSTEMS:  She denies any pleuritic pain, fevers, chills,  sweats, myalgias, arthralgias, history of leg injury, or prolonged  travel.   PHYSICAL EXAMINATION:  GENERAL:  She is a slightly anxious, but quite  pleasant ambulatory obese white female in no acute distress.  VITAL SIGNS:  Weight 215 pounds, blood pressure 156/80.  HEENT:  Unremarkable, oropharynx clear.  LUNGS:  Fields clear bilaterally to auscultation and percussion.  CARDIAC:  Regular rate and rhythm with no murmurs, rubs, or gallops.  ABDOMEN:  Soft and benign.  EXTREMITIES:  Warm without calf tenderness, cyanosis, or clubbing. There  was trace edema in the ankles only. She had prominent venous stasis  changes bilaterally, right greater than left, with negative calf  tenderness. There was no Homan's sign and there were no palpable cords.   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 I explained to the patient the differential  diagnosis does include DVT, but it is very unlikely that she has  bilateral DVT, and  much more likely since the problem is present in both  ankles although albeit right greater than left, but this all represents  venous insufficiency and the failure to control weight effectively.   I did review with her the need to take Furosemide p.r.n. leg swelling  and Meloxicam p.r.n. joint or ankle pain and reviewed with her how this  is listed on the p.r.n. sheet, whereas aspirin taken daily is not a  p.r.n., but should be taken prophylactically unless bruising is  excessive. All of this is reviewed on her med calendar   I reviewed all of the issues with her in writing. I will arrange for her  to have a venous Doppler at the next available date, but I do not think  that active DVT is likely.    [name]. [name], MD, [facility]  Electronically Signed   MBW/MedQ  DD: 09/27/2006  DT: 09/28/2006  Job #: [id number]

## 2010-07-05 NOTE — Assessment & Plan Note (Signed)
[facility]                             PULMONARY OFFICE NOTE   ROANNE, HAYE                        MRN:          [id number]  DATE:12/27/2006                            DOB:          [birth year]    HISTORY:  This is a 70 year old white female never smoker with morbid  obesity complicated by diabetes and hyperlipidemia.  Her diabetes in  turn has been complicated by painful paresthesias for which she has been  seeing a podiatrist and titrated on Lyrica up to 100 mg b.i.d. with  adequate control.  She is also taking a B6 supplement.  She denies any  exertional chest pain, orthopnea, PND, or leg swelling.   Note, that the patient is now under the management of Dr. Dagoberto Ligas for all  problems related to her cholesterol and diabetes and therefore only sees  me for hypertension and general medical care.   PAST MEDICAL HISTORY:  1. Morbid obesity, target weight less than 171 ideally.  2. Hypertension.  3. Hyperlipidemia, target LDL less than 100 based on diabetes.  4. 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  5. Status post lumbar laminectomy.  6. H/0 right nephrolithiasis in 1999.  7. Hypothyroidism on supplements.  8. Status post multiple benign breast biopsy.  9. GYN followed by Dr. Laurena Bering.  10.Diabetes diagnosed initially in 2000.   ALLERGIES:  None known.   MEDICATIONS:  Taken in detail on the worksheet using the medication  calendar format, correct as listed.   SOCIAL HISTORY:  She has never smoked.  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  She denies any alcohol use.   FAMILY HISTORY:  Positive for CHF in mother and father who lived to be  in their 27s.  She has 6 brothers, 4 sisters with diabetes and kidney  stones in the family.  There is lymphoma in 2 of her sisters, both with  non-Hodgkin's.   REVIEW OF SYSTEMS:  Taken in detail on the worksheet and negative except  as outlined above.   PHYSICAL EXAMINATION:   GENERAL:  This is an obese, ambulatory, pleasant,  white female in no acute distress.  VITAL SIGNS:  She is afebrile with normal vital signs with a blood  pressure of 130/80.  Weight is 223 pounds.  She is up 5 pounds from her  previous visit.  Height is 5 feet 6 inches, which is unchanged.  HEENT:  Ocular exam was done with limited funduscopy revealing no  significant retinal arterial change.  Ear canals clear bilaterally.  NECK:  Supple without cervical adenopathy or tenderness.  Trachea is  midline.  No thyromegaly.  LUNGS:  Lung fields perfectly clear bilaterally on auscultation  percussion.  BREASTS:  Without masses, nipple or skin changes.  HEART:  There is a regular rhythm without murmur, gallop, or rub.  ABDOMEN:  Soft, benign.  EXTREMITIES:  Warm without calf tenderness, cyanosis, clubbing, edema.  Pedal pulses were present bilaterally.  NEUROLOGIC:  No focal deficit, pathologic reflexes, although her deep  tendon reflexes were slightly diminished distally  as was light sensation  and in the lower extremities greater than the upper extremities.   LABORATORY STUDIES:  Included a bone densitometry that we had done in  December 2007, showing that she was within normal limits then.  She has  had all of her lab profile per Dr. Dagoberto Ligas recently done.  I did add a B-  MET at her request and she is pre-op for thumb surgery.  Her EKG and  chest x-ray were normal with no evidence of ischemic change or  hypertrophy.   IMPRESSION:  1. Morbid obesity complicated by hypertension.  2. Diabetes.  She is now under the care of Dr. Dagoberto Ligas for diabetes and      under the care of a podiatrist for painful paresthesias of the feet      which are much better.  Unfortunately she has not yet addressed      long term the calorie balance issues that I had originally      addressed with her roughly 10 years ago.  She received a calorie      balance sheet from me giving her specific goals of therapy in  2006,      but could not recall the information that was given to her then, so      I reviewed it again in detail including the strong recommendation      that she exercise at a level where she is short of breath but not      out of breath 30 minutes daily at a minimum.  3. Hyperlipidemia.  She is now on Actos which should further improve      cholesterol profile.  Further titration of this medication will be      through Dr. Dagoberto Ligas.  4. Hypertension.  This is adequately controlled on her present regimen      which includes an ARB.  She previously did not tolerate ACE      inhibitor because of cough.  5. Degenerative arthritis for which she has used minimal as needed      Tylenol.  6. Health maintenance.  She was updated on tetanus and Pneumovax in      2006.  She receives a flu vaccination every Fall.  7. Gynecologic health maintenance including mammography scheduled      through Dr. Londell Moh office.  8. Hypothyroidism on previous adequate supplements.  Since she is      being followed by Dr. Dagoberto Ligas for diabetes and hyperlipidemia, I      would recommend that he follow this problem as well long term.   I will confine myself henceforth to managing her blood pressure and any  other issues where she has not already selected a specialist to take  care of her problem.  The other option is to shift to Dr. Dagoberto Ligas for all  of her medical care which I would support if Dr. Dagoberto Ligas agrees.     [name]. [name], MD, [facility]  Electronically Signed    MBW/MedQ  DD: 12/27/2006  DT: 12/27/2006  Job #: [id number]   cc:   Alfonse Alpers. Dagoberto Ligas, M.D.

## 2010-07-08 NOTE — Assessment & Plan Note (Signed)
[facility]                               PULMONARY OFFICE NOTE   Theresa Summers, Theresa Summers                        MRN:          [id number]  DATE:11/10/2005                            DOB:          Jul 17, 1940    HISTORY OF PRESENT ILLNESS:  The patient is a 70 year old white female  patient of Dr. [name] with a known history of diabetes, hypertension, and  morbid obesity. She presents for a  6-week follow up.  Last visit the patient had complained of worsening  numbness and burning along the lower extremities bilaterally. The patient  had been recommended to increase her Lyrica up to 50 mg x3 daily.  However  the patient has only increased up to twice a day, reporting she continues to  have significant burning along her lower extremities. Felt secondary to  diabetic neuropathy. She describes the pain as a numbness and burning  bilaterally throughout the day and not relieved with rest or flared with  activity. Patient feels that the bottom of her feet are bruised. Also last  visit patient complained of some increased lower extremity swelling at the  end of the day. The patient was given Lasix to use on an as needed basis.  The patient does report that this has helped however she is unable to take  it due to work.  Lab work revealed A1C was improved at 6.4. Thyroid level  was normal an her thyroid and albumin level were normal. Total cholesterol  was 170 and LDL was at 100. The patient is maintained on Crestor daily. The  patient denies any chest pain, shortness of breath, orthopnea, PND, or  intermittent claudication symptoms.   PHYSICAL EXAMINATION:  GENERAL:  The patient is a pleasant female in no  acute distress. She is afebrile with stable vital signs. Oxygen saturation  is 96% on room air.  HEENT:  Unremarkable.  NECK:  Supple without adenopathy.  LUNGS:  Clear.  CARDIAC:  Regular rate and rhythm.  ABDOMEN:  Soft, nontender.  EXTREMITIES:  Warm  without any cyanosis or clubbing. There is trace edema.   IMPRESSION AND PLAN:  1. Diabetes mellitus with current A1C at goal. However suspect Actos is      contributing to her lower extremity swelling. Patient therefore will be      recommended to discontinue Actos and begin Amaryl 2 mg. Patient has      been advise to make sure she takes with meals to avoid hypoglycemia.      She will be rechecked here in 6 weeks or sooner if needed.  2. Hypertension - currently well controlled. Will continue on current      regimen.  3. Lower extremity edema. I suspect this will improve with discontinuation      of Actos. Patient may continue to use furosemide on an as needed basis.      Patient does have TED stockings which I have recommended that she begin      to wear and decrease her sodium intake. Also weight loss and exercise  will help with this as well.  4. Classic diabetic neuropathy with symmetrical paresthesias. Patient is      recommended to increase Lyrica up to 50 mg t.i.d. Also may need to      increase this dose on recheck which may help with her neuropathy      symptoms. Also did refer out for pain management as well.  5. Complex medication regimen. The patient did bring in her medication      calendar in today      which we reviewed with patient education provided and the computerized      list was adjusted accordingly and patient was given a new copy.                                   Rubye Oaks, NP                                [name]. [name], MD, [facility]   TP/MedQ  DD:  11/10/2005  DT:  11/12/2005  Job #:  [id number]

## 2010-07-08 NOTE — Cardiovascular Report (Signed)
NAMEKERYL, GHOLSON                 ACCOUNT NO.:  [id number]   MEDICAL RECORD NO.:  [id number]          PATIENT TYPE:  OIB   LOCATION:  6501                         FACILITY:  [facility]   PHYSICIAN:  Charlies Constable, M.D. [facility] DATE OF BIRTH:  1940/09/20   DATE OF PROCEDURE:  09/20/2004  DATE OF DISCHARGE:                              CARDIAC CATHETERIZATION   PROCEDURE:  Cardiac catheterization.   CLINICAL HISTORY:  Theresa Summers is 70 years old and currently works as a  Production designer, theatre/television/film for [employer].  She previously had worked as Production designer, theatre/television/film for  [employer] and for the [employer] group here.  She has had symptoms  of fatigue and Dr. [name] ordered a Cardiolite scan which showed lateral  ischemia.  She was seen in consultation by Dr. Daleen Squibb and he arranged for her  to be evaluated with angiography in the outpatient laboratory.  Her risk  factors include diabetes, hyperlipidemia and hypertension, and some family  history of coronary heart disease.   PROCEDURE:  The procedure was performed via the right femoral artery using  arterial sheath and 4-French preformed coronary catheters.  A femoral  arterial puncture was performed and Omnipaque contrast was used.  A distal  aortogram was performed to rule out renovascular causes for hypertension.  The patient tolerated the procedure well and left the laboratory in  satisfactory condition.   RESULTS:  The aortic pressure was 161/84 with a mean of 121 and a left  ventricular pressure of 164/13.   Left main coronary artery:  The left main coronary artery was free of  significant disease.   Left anterior descending artery:  The left anterior descending artery gave  rise to 2 diagonal branches and a septal perforator.  The septal perforator  was somewhat ectatic in its distal portion, but there was no definite  obstruction.  The rest of the LAD was free of significant obstruction or  irregularity.   Left circumflex artery:  The left circumflex  artery gave rise to a ramus  branch, a marginal branch and a posterolateral branch.  These vessels were  free of significant disease.   Right coronary artery:  The right coronary artery is a moderate-sized vessel  that gave rise to a conus branch, 2 right ventricular branches, a posterior  descending branch and a small posterolateral branch.  These vessels were  free of significant disease.   LEFT VENTRICULOGRAM:  The left ventriculogram performed in the RAO  projection showed good wall motion with no areas of hypokinesis.  The  ejection fraction was 60%.   DISTAL AORTOGRAM:  A distal aortogram showed patent renal arteries and no  significant aortoiliac obstruction down to the proximal iliacs.   CONCLUSION:  Normal coronary angiography and left ventricular wall motion.   RECOMMENDATIONS:  Reassurance.  In view of these findings, I think the  Cardiolite scan represents a false-positive scan.  We will plan to arrange  for a followup check of right groin and Dr. [name] can proceed with further  evaluation for etiology of the patient's symptoms of fatigue.  BB/MEDQ  D:  09/20/2004  T:  09/20/2004  Job:  [id number]   cc:   [name]. [name], M.D. [facility]   Maisie Fus C. Wall, M.D.   [facility] Cardiopulmonary [facility]

## 2010-07-08 NOTE — Assessment & Plan Note (Signed)
[facility]                               PULMONARY OFFICE NOTE   NOELE, ICENHOUR                        MRN:          [id number]  DATE:12/29/2005                            DOB:          1940-04-14    PRIMARY SERVICE/COMPREHENSIVE HEALTH CARE EVALUATION   HISTORY:  This is a 70 year old white female with morbid obesity complicated  by hypertension, hyperlipidemia, and diabetes who comes in for comprehensive  health care evaluation having made little head way in terms of meaningful  weight loss toward her goal of 171 pounds in the last year.  She admits she  has been very stressed out related to family issues and has not been paying  as much attention to diet and exercise.  She denies any exertional chest  pain, orthopnea, PND, TIA, or claudication symptoms.  In terms of her  diabetes, she has typically ranged fasting blood sugars in the low 100s per  self-monitoring.  She has mild symmetric paresthesias in the lower  extremities that have not progressed over the last year.   PAST MEDICAL HISTORY:  1. Obesity with target weight less than 171 versus a peak of 239 and a      weight of 219 a year ago at this time.  2. Hypertension.  3. Hyperlipidemia, target LDL less than 100 based on diabetes.  4. Degenerative joint disease, followed by Dr. Eulah Pont.  5. Status post lumbar laminectomy.  6. Status post history of right nephrolithiasis with basket extraction in      1999.  7. Hypothyroidism, on treatment.  8. Status post multiple benign breast biopsies.  9. GYN followup by Dr. Laurena Bering and health maintenance per Dr. Laurena Bering.  10.Diabetes.   ALLERGIES:  NONE KNOWN.   MEDICATIONS:  Taken in detail on the worksheet from the medication calendar  she has with her and correct is the column dated December 29, 2005.   SOCIAL HISTORY:  She has never smoked.  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  She denies any alcohol  use.   FAMILY HISTORY:  Positive for CHF in her mother and father who lived to be  in their 51s.  She has six brothers, four sisters, with diabetes and kidney  stones running the family.  There is lymphoma in two of her sisters, both  non-Hodgkin's in nature.   REVIEW OF SYSTEMS:  Taken in detail on the worksheet and significant for the  problems as outlined above.  She does have nocturia x2.   PHYSICAL EXAMINATION:  GENERAL:  This is a pleasant ambulatory white female  in no acute distress.  VITAL SIGNS:  Weight now of 218 versus 219 a year ago.  She is afebrile with  normal vital signs.  Blood pressure 124/80.  HEENT:  Somewhat limited but revealing no significant retinal arterial  change.  Oropharynx is clear.  Dentition intact.  NECK:  Supple without cervical adenopathy, tenderness.  Trachea is midline.  Carotid upstrokes were brisk without bruits.  CHEST:  Clear today to auscultation and percussion.  HEART:  Regular rate and rhythm without murmur, gallop, or rub.  BREASTS:  Without masses, nipple or skin changes.  There were no axillary  nodes.  ABDOMEN:  Soft and benign.  No palpable organomegaly, mass, or tenderness.  Femoral pulses were present bilaterally, no bruits.  GENITOURINARY:  Rectal per Dr. Katherine Mantle.  EXTREMITIES:  Warm without calf tenderness, cyanosis, clubbing, or edema.  She has normal pedal pulses.  She has moderate diffuse varicosities,  serpiginous varicosities.  NEUROLOGIC EXAM:  She has decreased DTRs distally with subjective sensation  of decreased light touch at the knees in a symmetric stocking pattern  bilaterally.  SKIN:  Unremarkable except for varicosities.  MUSCULOSKELETAL EXAM:  Revealed normal motion of the hips, knees, and  ankles.   LAB DATA:  1. CBC was normal.  2. Sed rate was only 8.  3. Cholesterol 170.  LDL cholesterol 97.  HDL 43.  4. Chemistry profile was normal except for a fasting blood sugar of 135.  5. Hemoglobin A1c was  6.5.  6. TSH was normal at 2.13.  7. Urinalysis was unremarkable with no protein.  8. Chest x-ray showed mild cardiomegaly.  9. EKG was normal.   IMPRESSION:  1. Morbid obesity complicated by diabetes.  Her hemoglobin A1c is trending      slightly upward.  She reports that when she tried to take two Amaryl      she became more hypoglycemic.  She had problems with fasting      hypoglycemia.  At this point rather than add more medications, however,      I am going to recommend more attention to diet and exercise and      discussed with her returning to her nutritionist for more feedback in      terms of a two week diary before making any changes in her medications.  2. Hyperlipidemia.  Her LDL is at goal on Crestor with no evidence of      adverse liver effect, therefore no change in therapy.  3. Hypertension, well controlled on her present regimen which includes      Hyzaar which offers some protection of her renal function from diabetes      and hypertension and note the absence of macroscopic proteinuria.  4. Degenerative joint disease for which she uses Celebrex on a p.r.n.      basis.  Will follow up with Dr. Eulah Pont p.r.n.  5. Health maintenance.  She was updated on tetanus and Pneumovax a year      ago.  Declined the flu shot this year.  6. Hypothyroidism.  Is on adequate supplements by TSH.  Followup will be      every three months, sooner if needed.  7. GYN health maintenance per Dr. Katherine Mantle.  We need to check on      her next visit regarding mammography update and she will need a bone      densitometry done this year if not done by Dr. Laurena Bering.    ______________________________  [name]. [name], MD, [facility]    MBW/MedQ  DD: 12/30/2005  DT: 12/30/2005  Job #: [id number]   cc:   Andres Ege, M.D.

## 2010-07-08 NOTE — Assessment & Plan Note (Signed)
Theresa Summers                             PULMONARY OFFICE NOTE   Theresa Summers, Theresa Summers                        MRN:          [id number]  DATE:03/09/2006                            DOB:          08-29-1940    HISTORY OF PRESENT ILLNESS:  The patient is a 70 year old white female  patient of Dr. [name], who has a known history of hyperlipidemia,  degenerative joint disease presents for an acute office visit.  The  patient complains of a 4-day history of nasal congestion, post nasal  drip, essentially a nonproductive cough, and hoarseness.  The patient  denies any hemoptysis, chest pain, shortness of breath, recent travel or  antibiotic use.   PAST MEDICAL HISTORY:  Reviewed.   CURRENT MEDICATIONS:  Reviewed.   PHYSICAL EXAMINATION:  GENERAL:  The patient is a pleasant, obese female  in no acute distress.  VITAL SIGNS:  She is afebrile with stable vital signs.  O2 saturation is  96% on room air.  HEENT:  Nasal mucosa was slightly pale.  Nontender sinus.  Conjunctivae  not injected.  Posterior pharynx is clear without any exudate.  NECK:  Supple without adenopathy.  RESPIRATORY:  Lung sounds are clear.  CARDIAC:  Regular rate.  ABDOMEN:  Soft and obese.  EXTREMITIES:  Warm without any edema.   IMPRESSION:  Acute upper respiratory infection, suspect it is viral in  nature.   PLAN:  The patient is to add Mucinex DM twice daily.  Magic mouthwash as  needed.  The patient was given a prescription for [facility] for 5 days to  have on hold in case symptoms worsen with purulent sputum.  The patient  will follow back up with Dr. [name] as scheduled or sooner as needed.      Rubye Oaks, NP  Electronically Signed      [name]. [name], MD, [facility]  Electronically Signed   TP/MedQ  DD: 03/12/2006  DT: 03/12/2006  Job #: [id number]

## 2010-07-08 NOTE — Assessment & Plan Note (Signed)
[facility]                               PULMONARY OFFICE NOTE   Theresa Summers, Theresa Summers                        MRN:          [id number]  DATE:10/04/2005                            DOB:          [birth year]    PRIMARY SERVICE OFFICE EXTENDED FOLLOWUP VISIT:   HISTORY:  The patient presents with 6 major medical problems.  She is  morbidly obese with diabetes and hypertension and comes in complaining of  worsening numbness and burning over both legs in a symmetric fashion.  She  has also had worsening leg swelling as the day goes on despite being  maintained on hydrochlorothiazide at 25 mg daily (please see medication face  sheet, dated October 04, 2005, for details).  She previously was placed on  Lyrica but only takes it p.r.n., not sure it really works.  She denies any  overt symptoms of diabetes, orthopnea, PND.   Previous workup included a normal albumin level, creatinine, and BNP on Jul 12, 2005, although we did not do a TSH on that visit.   PHYSICAL EXAMINATION:  GENERAL:  She is an obese white female in no acute  distress.  VITAL SIGNS:  She is afebrile.  Blood pressure 134/80 and weight of 220  pounds, after seeing her nutritionist in between visits.  HEENT:  Unremarkable.  Pharynx clear.  LUNGS:  Fields completely clear bilaterally auscultation percussion.  HEART:  Regular rhythm without murmur, gallop or rub.  ABDOMEN:  Soft, benign.  EXTREMITIES:  Warm without calf tenderness, cyanosis, clubbing, or  significant edema.  She had minimal decreased sensation in a stocking  pattern up to the mid calf bilaterally with decreased DTRs.   IMPRESSION:  I had an extended discussion with this patient lasting 15-20  minutes in a 25-minute visit on the following topics:  1. Morbid obesity complicated by diabetes.  It may be that the Actos at      this point that she is using is controlling her diabetes but leading to      leg swelling.  I am going to  check a baseline hemoglobin A1c and bring      her back in 3 weeks to consider eliminating Actos in favor of a      combination of glyburide and metformin.  2. Hypertension, adequately controlled on present regimen which includes      the hydrochlorothiazide diuretic.  However, leg swelling may be related      to Actos which is refractory to hydrochlorothiazide and therefore, I      recommended adding furosemide 20 mg every day as-needed leg swelling.  3. Obesity has not been addressed yet by the patient and I have asked her      to return to see her nutritionist between now and her next visit in      November (at which time we will see her for a physical) with a food      diary having been reviewed by a nutritionist in the meantime.  4. Classic diabetic neuropathy with symmetric paresthesias.  I would like  to re-challenge her      with Lyrica 50 mg t.i.d. particularly between now and her next visit      with our nurse practitioner for full medication reconciliation      purposes.                                   [name]. [name], MD, [facility]   MBW/MedQ  DD:  10/04/2005  DT:  10/04/2005  Job #:  [id number]

## 2010-07-08 NOTE — Assessment & Plan Note (Signed)
[facility]                             PULMONARY OFFICE NOTE   Theresa Summers                        MRN:          [id number]  DATE:04/13/2006                            DOB:          August 02, 1940    HISTORY OF PRESENT ILLNESS:  The patient is a 70 year old white female  patient of Dr. Thurston Hole who has a known history of diabetes mellitus,  hyperlipidemia and hypertension that presents for a 3 month office  visit.  Patient has previously been maintained on Crestor with an LDL  target less than 70.  Unfortunately, patient had increased myalgias and  joint pain and has stopped Crestor over the last 3 months; however, I  have seen no improvement in her joint or muscle pain.  Patient's last  A1c was 6.5, she is currently maintained on Amaryl and metformin.  Patient reports that she is doing well today without any complaints.  She denies any chest pain, palpitations, abdominal pain, nausea,  vomiting or leg swelling.  Patient does request that if she is to go  back on cholesterol medicines that we switch her to a generic since  Crestor has a high co-payment on her insurance plan.   PAST MEDICAL HISTORY:  Reviewed.   CURRENT MEDICATIONS:  Reviewed.   PHYSICAL EXAM:  Patient is a pleasant female in no acute distress.  She  is afebrile, stable vital signs.  The O2 saturation is 96% on room air.  HEENT:  Unremarkable.  NECK:  Supple without adenopathy.  No JVD.  LUNG SOUNDS:  Clear.  CARDIAC:  Regular rate and rhythm.  ABDOMEN:  Soft and nontender.  EXTREMITIES:  Warm without any edema.   IMPRESSION AND PLAN:  1. Diabetes mellitus.  Currently well controlled on present regimen.      Patient does continue to struggle with her weight and diet at      times.  Will refer her over to a diabetic nutrition specialist.  2. Hypertension, currently well controlled on present regimen.  3. Hyperlipidemia with a target LDL goal of less than 70.  Patient has  been off Crestor.  Will recheck a fasting lipid panel if patient is      not at goal.  Patient has agreed to restart generic      Zocor at 20 mg.  4. Patient will follow back up with Dr. [name] in 3 months or sooner if      needed.      Rubye Oaks, NP  Electronically Signed      [name]. [name], MD, [facility]  Electronically Signed   TP/MedQ  DD: 04/13/2006  DT: 04/13/2006  Job #: [id number]

## 2010-11-29 LAB — I-STAT 8, (EC8 V) (CONVERTED LAB)
BUN: 16
HCT: 44
Hemoglobin: 15
Operator id: 123881
Sodium: 141
TCO2: 30
pCO2, Ven: 46.8

## 2010-11-30 LAB — BASIC METABOLIC PANEL
Creatinine, Ser: 0.51
GFR calc non Af Amer: >60
Glucose, Bld: 141 — ABNORMAL HIGH

## 2010-11-30 LAB — POCT HEMOGLOBIN-HEMACUE: Hemoglobin: 14.4

## 2010-12-06 ENCOUNTER — Other Ambulatory Visit: Payer: Self-pay | Admitting: Internal Medicine

## 2010-12-06 DIAGNOSIS — Z1231 Encounter for screening mammogram for malignant neoplasm of breast: Secondary | ICD-10-CM

## 2011-01-16 ENCOUNTER — Ambulatory Visit
Admission: RE | Admit: 2011-01-16 | Discharge: 2011-01-16 | Disposition: A | Discharge status: Home / Self Care | Payer: Medicare Other | Source: Ambulatory Visit | Attending: Internal Medicine | Admitting: Internal Medicine

## 2011-01-16 DIAGNOSIS — Z1231 Encounter for screening mammogram for malignant neoplasm of breast: Secondary | ICD-10-CM

## 2011-03-14 ENCOUNTER — Ambulatory Visit (INDEPENDENT_AMBULATORY_CARE_PROVIDER_SITE_OTHER): Payer: Medicare Other | Admitting: Adult Health

## 2011-03-14 ENCOUNTER — Telehealth: Payer: Self-pay | Admitting: Adult Health

## 2011-03-14 ENCOUNTER — Encounter: Payer: Self-pay | Admitting: *Deleted

## 2011-03-14 ENCOUNTER — Encounter: Payer: Self-pay | Admitting: Adult Health

## 2011-03-14 VITALS — BP 112/58 | HR 85 | Temp 97.2°F | Ht 66.5 in | Wt 224.4 lb

## 2011-03-14 DIAGNOSIS — J209 Acute bronchitis, unspecified: Secondary | ICD-10-CM

## 2011-03-14 MED ORDER — TRAZODONE HCL 100 MG PO TABS: ORAL_TABLET | ORAL | Status: DC

## 2011-03-14 MED ORDER — HYDROCODONE-HOMATROPINE 5-1.5 MG/5ML PO SYRP: 5.0000 mL | ORAL_SOLUTION | Freq: Four times a day (QID) | ORAL | Status: AC | PRN

## 2011-03-14 MED ORDER — CEFDINIR 300 MG PO CAPS: 300.0000 mg | ORAL_CAPSULE | Freq: Two times a day (BID) | ORAL | Status: AC

## 2011-03-14 NOTE — Assessment & Plan Note (Signed)
Flare   Plan:  Omnicef 300mg  Twice daily  For 7 days  Mucinex DM Twice daily  As needed  Cough/congestion  Saline nasal rinses As needed   Hydromet 1-2 tsp every 4-6 hr As needed  Cough- may make you sleepy  Please contact office for sooner follow up if symptoms do not improve or worsen or seek emergency care  follow up Dr. [name]  In 3 months for physical.

## 2011-03-14 NOTE — Telephone Encounter (Signed)
I spoke with pt and advised her will fax over new letter w/ our letterhead on it. NOthing further was needed and letter was faxed to # provided

## 2011-03-14 NOTE — Progress Notes (Signed)
Subjective:    Patient ID: Theresa Summers, female    DOB: [birth year], 71 y.o.   MRN: [id number]  HPI 46  yowf never smoker with morbid obesity complicated by diabetes and hyperlipidemia. Her diabetes in turn has been complicated by painful paresthesias for which she has been treated previously with Lyric 75 in am 150 in pm with adequate control followed by Dr. Omer Jack, Endocrinology.   November 20, 2007--ov for cough with talking, laughing and bedtime. Mainly dry, resolved with tessilon and then dc'd without relapse, cxr read by radiology as possible rul nodule.   January 14, 2008 ov multiple issues  1. Painful neuropathy partially responsive to lyrica  2. Sleeping worse more due to restless than paresthesia  3 Left hip pain Bednarz.   October 05, 2008 ov needs trazadone at 100mg  2 qm with option of 3 if needed (never does) no problem with children around her meds though she has 5 grand children now and doing lots of babysitting. denies depression and says sleeps great on the trazadone with good control of paresthesias on lyrica and vitamins. rec no change in rx   January 10, 2010 cc sob x 6 months with ex and also with lying down cough-dry x 3-4 days,increased SOB,sorethroat,wheezing, no fcs but stuffy nose, no purulent secretions. rec Augmentin one twice daily x 7days > mucus turned clear  Add pepcid 20mg  one at bedtime plus gerd diet  Prednisone 10 mg 4 each am x 2days, 2x2days, 1x2days and stop > cough better   January 31, 2010 cc doe x bending over, limited by feet walking, can only do stationery bike x 10 min whereas used to do it x 30 at same speed and resistance. cough ok control without suppressants.  03/14/2011 Acute OV  Complains of prod cough with thick gray mucus, feels "lousy", wheezing, sneezing x5days. OTC cold meds not helping. Cough is worse at night, keeping her up at night.  No fever. No hemoptysis. No vomitting.  Is due for CPX     Past Medical History:  TRIGGER  FINGER DEFORMITY (ICD-727.03)  HYPERLIPIDEMIA (ICD-272.4)..........................................Marland KitchenGegick  DM (ICD-250.00)..............................................................Marland KitchenGegick  GERD (ICD-530.81)  VENOUS INSUFFICIENCY, CHRONIC (ICD-459.81)  OBESITY, MORBID (ICD-278.01)  Health Maintenance............................................................Marland KitchenGegick  -DT 11/06  -Pneumovax 11/06  -DEXA 01/22/06 nl, repeat 02/16/08 also nl   Review of Systems Constitutional:   No  weight loss, night sweats,  Fevers, chills,  +fatigue, or  lassitude.  HEENT:   No headaches,  Difficulty swallowing,  Tooth/dental problems, or  Sore throat,                No sneezing, itching, ear ache,  +nasal congestion, post nasal drip,   CV:  No chest pain,  Orthopnea, PND, swelling in lower extremities, anasarca, dizziness, palpitations, syncope.   GI  No heartburn, indigestion, abdominal pain, nausea, vomiting, diarrhea, change in bowel habits, loss of appetite, bloody stools.   Resp:  No coughing up of blood.  Marland Kitchen  No chest wall deformity  Skin: no rash or lesions.  GU: no dysuria, change in color of urine, no urgency or frequency.  No flank pain, no hematuria   MS:  No joint pain or swelling.  No decreased range of motion.  No back pain.  Psych:  No change in mood or affect. No depression or anxiety.  No memory loss.         Objective:   Physical Exam GEN: A/Ox3; pleasant , NAD, overweight   HEENT:  [place]/AT,  EACs-clear, TMs-wnl, NOSE-clear  drainage , THROAT-clear, no lesions, no postnasal drip or exudate noted.   NECK:  Supple w/ fair ROM; no JVD; normal carotid impulses w/o bruits; no thyromegaly or nodules palpated; no lymphadenopathy.  RESP  Clear  P & A; w/o, wheezes/ rales/ or rhonchi.no accessory muscle use, no dullness to percussion  CARD:  RRR, no m/r/g  , no peripheral edema, pulses intact, no cyanosis or clubbing.  GI:   Soft & nt; nml bowel sounds; no organomegaly or  masses detected.  Musco: Warm bil, no deformities or joint swelling noted.   Neuro: alert, no focal deficits noted.    Skin: Warm, no lesions or rashes         Assessment & Plan:

## 2011-03-14 NOTE — Patient Instructions (Signed)
Omnicef 300mg  Twice daily  For 7 days  Mucinex DM Twice daily  As needed  Cough/congestion  Saline nasal rinses As needed   Hydromet 1-2 tsp every 4-6 hr As needed  Cough- may make you sleepy  Please contact office for sooner follow up if symptoms do not improve or worsen or seek emergency care  follow up Dr. [name]  In 3 months for physical.

## 2011-05-04 ENCOUNTER — Other Ambulatory Visit: Payer: Self-pay | Admitting: *Deleted

## 2011-05-04 MED ORDER — ESOMEPRAZOLE MAGNESIUM 40 MG PO CPDR: 40.0000 mg | DELAYED_RELEASE_CAPSULE | Freq: Every day | ORAL | Status: DC

## 2011-06-19 ENCOUNTER — Ambulatory Visit (INDEPENDENT_AMBULATORY_CARE_PROVIDER_SITE_OTHER): Payer: Medicare Other | Admitting: Internal Medicine

## 2011-06-19 ENCOUNTER — Encounter: Payer: Self-pay | Admitting: Internal Medicine

## 2011-06-19 VITALS — BP 128/72 | HR 76 | Temp 98.0°F | Ht 66.0 in | Wt 225.6 lb

## 2011-06-19 DIAGNOSIS — I10 Essential (primary) hypertension: Secondary | ICD-10-CM

## 2011-06-19 DIAGNOSIS — K219 Gastro-esophageal reflux disease without esophagitis: Secondary | ICD-10-CM

## 2011-06-19 MED ORDER — TRAZODONE HCL 100 MG PO TABS: ORAL_TABLET | ORAL | Status: DC

## 2011-06-19 MED ORDER — ESOMEPRAZOLE MAGNESIUM 40 MG PO CPDR: 40.0000 mg | DELAYED_RELEASE_CAPSULE | Freq: Every day | ORAL | Status: DC

## 2011-06-19 NOTE — Assessment & Plan Note (Signed)
Assoc with cough/ Adequate control on present rx, reviewed

## 2011-06-19 NOTE — Patient Instructions (Signed)
Weight control is simply a matter of calorie balance which needs to be tilted in your favor by eating less and exercising more.  To get the most out of exercise, you need to be continuously aware that you are short of breath, but never out of breath, for 30 minutes daily. As you improve, it will actually be easier for you to do the same amount of exercise  in  30 minutes so always push to the level where you are short of breath.  If this does not result in gradual weight reduction then I strongly recommend you see a nutritionist with a food diary x 2 weeks so that we can work out a negative calorie balance which is universally effective in steady weight loss programs.  Think of your calorie balance like you do your bank account where in this case you want the balance to go down so you must take in less calories than you burn up.  It's just that simple:  Hard to do, but easy to understand.  Good luck!    We can see you yearly at this point to follow up your hypertension and reflux/cough, sooner if needed, with regular follow up by Dr Sharl Ma in meantime

## 2011-06-19 NOTE — Assessment & Plan Note (Addendum)
Has not been able to consistently address this concern but now appears to be heading in the right direction/ encourage to continue with diet and exercise.

## 2011-06-19 NOTE — Progress Notes (Signed)
  Subjective:    Patient ID: Theresa Summers, female    DOB: 12-13-1940 .   MRN: [id number]  HPI 52 yowf never smoker with morbid obesity complicated by diabetes and hyperlipidemia. Her diabetes in turn has been complicated by painful paresthesias for which she has been treated previously with Lyric 75 in am 150 in pm with adequate control followed by Dr. Sharl Summers, Endocrinology.      03/14/2011 Acute OV  Complains of prod cough with thick gray mucus, feels "lousy", wheezing, sneezing x5days. OTC cold meds not helping. Cough is worse at night, keeping her up at night.  No fever. No hemoptysis. No vomitting.  Is due for CPX  rec Omnicef 300mg  Twice daily  For 7 days  Mucinex DM Twice daily  As needed  Cough/congestion  Saline nasal rinses As needed   Hydromet 1-2 tsp every 4-6 hr As neede  06/19/2011 f/u ov/Theresa Summers multiple chronic problems DM/Hypothyroid/ Hyperlipidemia Dr Theresa Summers complicated by DM neuropathy. HBP/ obesity followed by Theresa Summers. No sob, tia, claudication symptoms.  No longer coughing on nexium q am ac     Past Medical History:  TRIGGER FINGER DEFORMITY (ICD-727.03)  HYPERLIPIDEMIA (ICD-272.4).......................................Marland Kitchen  Theresa Summers DM (ICD-250.00).............................................................Marland Kitchen  Theresa Summers  GERD (ICD-530.81)  VENOUS INSUFFICIENCY, CHRONIC (ICD-459.81)  OBESITY, MORBID (ICD-278.01)  Health Maintenance............................................................  Theresa Summers -DT 12/2004 -Pneumovax 11/06 and 2011 -DEXA 01/22/06 nl, repeat 02/16/08 also nl -CPX 06/19/2011  -GYN  Theresa Summers              Objective:   Physical Exam  Wt Readings from Last 3 Encounters:  06/19/11 225 lb 9.6 oz (102.331 kg)  03/14/11 224 lb 6.4 oz (101.787 kg)  01/31/10 243 lb (110.224 kg)    GEN: A/Ox3; pleasant , NAD, overweight   HEENT:  Theresa Summers/AT,  EACs-clear, TMs-wnl, NOSE-clear drainage , THROAT-clear, no lesions, no postnasal drip or exudate noted.   NECK:  Supple w/  fair ROM; no JVD; normal carotid impulses w/o bruits; no thyromegaly or nodules palpated; no lymphadenopathy.  RESP  Clear  P & A; w/o, wheezes/ rales/ or rhonchi.no accessory muscle use, no dullness to percussion  CARD:  RRR, no m/r/g  , no peripheral edema, pulses intact, no cyanosis or clubbing.  GI:   Soft & nt; nml bowel sounds; no organomegaly or masses detected.  Musco: Warm bil, no deformities or joint swelling noted.   Neuro: alert, no focal deficits noted.    Skin: Warm, no lesions or rashes    CXR  06/19/2011 :  ordered not done    Assessment & Plan:

## 2011-06-19 NOTE — Assessment & Plan Note (Signed)
ekg and cxr on, labs per Dr Sharl Ma  Adequate control on present rx, reviewed

## 2011-07-05 ENCOUNTER — Telehealth: Payer: Self-pay | Admitting: Internal Medicine

## 2011-07-05 MED ORDER — FAMCICLOVIR 500 MG PO TABS: 500.0000 mg | ORAL_TABLET | Freq: Two times a day (BID) | ORAL | Status: AC | PRN

## 2011-07-05 NOTE — Telephone Encounter (Signed)
[facility] patient [facility]  famvir 500 mg  #10  1 po bid prn  Allergies  Allergen Reactions  . Oxycodone-Acetaminophen Nausea Only  . Zithromax (Azithromycin)     thrush   Dr [name] is this ok to fill  Thank you

## 2011-07-05 NOTE — Telephone Encounter (Signed)
rx has been sent 

## 2011-07-05 NOTE — Telephone Encounter (Signed)
Ok with me 

## 2011-07-24 ENCOUNTER — Encounter: Payer: 59 | Attending: Internal Medicine | Admitting: *Deleted

## 2011-07-24 DIAGNOSIS — E1149 Type 2 diabetes mellitus with other diabetic neurological complication: Secondary | ICD-10-CM

## 2011-07-24 DIAGNOSIS — E785 Hyperlipidemia, unspecified: Secondary | ICD-10-CM

## 2011-07-24 DIAGNOSIS — I10 Essential (primary) hypertension: Secondary | ICD-10-CM

## 2011-07-24 DIAGNOSIS — E119 Type 2 diabetes mellitus without complications: Secondary | ICD-10-CM

## 2011-07-26 ENCOUNTER — Encounter: Payer: Self-pay | Admitting: *Deleted

## 2011-07-26 NOTE — Patient Instructions (Signed)
Goals:  Follow Diabetes Meal Plan as instructed  Eat 3 meals and 2 snacks, every 3-5 hrs  Limit carbohydrate intake to 30-45 grams carbohydrate/meal  Limit carbohydrate intake to 0-15 grams carbohydrate/snack  Add lean protein foods to meals/snacks  Monitor glucose levels as instructed by your doctor  Aim for 30 mins of physical activity daily as tolerated  Bring food record and glucose log to your next nutrition visit  

## 2011-07-26 NOTE — Progress Notes (Signed)
  Patient was seen on 07/24/2011 for the first of a series of three diabetes self-management courses at the [facility].  Current A1c = 7.0%  The following learning objectives were met by the patient during this course:   Defines the role of glucose and insulin  Identifies type of diabetes and pathophysiology  Defines the diagnostic criteria for diabetes and prediabetes  States the risk factors for Type 2 Diabetes  States the symptoms of Type 2 Diabetes  Defines Type 2 Diabetes treatment goals  Defines Type 2 Diabetes treatment options  States the rationale for glucose monitoring  Identifies A1C, glucose targets, and testing times  Identifies proper sharps disposal  Defines the purpose of a diabetes food plan  Identifies carbohydrate food groups  Defines effects of carbohydrate foods on glucose levels  Identifies carbohydrate choices/grams/food labels  States benefits of physical activity and effect on glucose  Review of suggested activity guidelines  Handouts given during class include:  Type 2 Diabetes: Basics Book  My Food Plan Book  Food and Activity Log  Follow-Up Plan: Core Class 2

## 2011-08-16 ENCOUNTER — Telehealth: Payer: Self-pay | Admitting: Internal Medicine

## 2011-08-16 NOTE — Telephone Encounter (Signed)
Per JJ, the pt can see TP tomorrow at 3:15. Spoke with pt and notified of appt and she was okay with this.  Advised to seek emergent care sooner if needed.

## 2011-08-16 NOTE — Telephone Encounter (Signed)
Called and spoke with pt. She states that her BP has been elevated x 6 wks since her GYN started her on new med called myrbetrig (new med for bladder). She states that she this am BP was 162/86 and this is what it has been running. It is causing her to have slight HA, and fatigue and "overall feels bad". She only takes benicar 20 mg daily for BP. Would like to come in for ov. MW out of the office until next wk and TP has no openings. Will hold in triage for now to discuss with JJ and see if she can add pt on to see TP tomorrow.

## 2011-08-17 ENCOUNTER — Encounter: Payer: Self-pay | Admitting: Adult Health

## 2011-08-17 ENCOUNTER — Ambulatory Visit (INDEPENDENT_AMBULATORY_CARE_PROVIDER_SITE_OTHER): Payer: 59 | Admitting: Adult Health

## 2011-08-17 VITALS — BP 140/70 | HR 77 | Temp 97.2°F | Ht 66.5 in | Wt 230.2 lb

## 2011-08-17 DIAGNOSIS — I10 Essential (primary) hypertension: Secondary | ICD-10-CM

## 2011-08-17 MED ORDER — OLMESARTAN MEDOXOMIL 40 MG PO TABS: 40.0000 mg | ORAL_TABLET | Freq: Every day | ORAL | Status: DC

## 2011-08-17 MED ORDER — HYDROCODONE-HOMATROPINE 5-1.5 MG/5ML PO SYRP: 5.0000 mL | ORAL_SOLUTION | Freq: Four times a day (QID) | ORAL | Status: AC | PRN

## 2011-08-17 NOTE — Patient Instructions (Addendum)
Increase Benicar 40mg  daily .  Low salt diet  Avoid decongestants, NSAIDS .  Check blood pressure daily -keep log, call if b/p remains elevated >160  Contact Dr. Genice Rouge if b/p is not improving .  Follow up in 6 weeks and As needed

## 2011-08-17 NOTE — Progress Notes (Signed)
Subjective:    Patient ID: Theresa Summers, female    DOB: 19-Nov-1940 .   MRN: [id number]  HPI 38 yowf never smoker with morbid obesity complicated by diabetes and hyperlipidemia. Her diabetes in turn has been complicated by painful paresthesias for which she has been treated previously with Lyric 75 in am 150 in pm with adequate control followed by Dr. Sharl Ma, Endocrinology.    03/14/2011 Acute OV  Complains of prod cough with thick gray mucus, feels "lousy", wheezing, sneezing x5days. OTC cold meds not helping. Cough is worse at night, keeping her up at night.  No fever. No hemoptysis. No vomitting.  Is due for CPX  rec Omnicef 300mg  Twice daily  For 7 days  Mucinex DM Twice daily  As needed  Cough/congestion  Saline nasal rinses As needed   Hydromet 1-2 tsp every 4-6 hr As neede  06/19/2011 f/u ov/Wert multiple chronic problems DM/Hypothyroid/ Hyperlipidemia Dr Sharl Ma complicated by DM neuropathy. HBP/ obesity followed by [name]. No sob, tia, claudication symptoms.  No longer coughing on nexium q am ac   08/17/2011 Acute OV  Complains of elevated BP x 6 weeks since beginning Myrbetriq-for overactive bladder  B/p running 170s at home .  Seen at urgent care last week, tx w/ ceftin for sinus infection  Had stopped benicar at last ov  With Dr. Sharl Ma 1 month ago ? Reason, no office notes (b/p on low side ? )  Side effects profile of Myrbetriq is elevated b/p.  No chest pain or edema.  No headache, visual changes.  Had labs last month -no reports available.       Past Medical History:  TRIGGER FINGER DEFORMITY (ICD-727.03)  HYPERLIPIDEMIA (ICD-272.4).......................................Marland Kitchen  Sharl Ma DM (ICD-250.00).............................................................Marland Kitchen  Sharl Ma  GERD (ICD-530.81)  VENOUS INSUFFICIENCY, CHRONIC (ICD-459.81)  OBESITY, MORBID (ICD-278.01)  Health Maintenance............................................................  Wert -DT 12/2004 -Pneumovax 11/06 and  2011 -DEXA 01/22/06 nl, repeat 02/16/08 also nl -CPX 06/19/2011  -GYN  [name]   ROS:  Constitutional:   No  weight loss, night sweats,  Fevers, chills,  +fatigue, or  lassitude.  HEENT:   No headaches,  Difficulty swallowing,  Tooth/dental problems, or  Sore throat,                No sneezing, itching, ear ache,  +nasal congestion, post nasal drip,   CV:  No chest pain,  Orthopnea, PND, swelling in lower extremities, anasarca, dizziness, palpitations, syncope.   GI  No heartburn, indigestion, abdominal pain, nausea, vomiting, diarrhea, change in bowel habits, loss of appetite, bloody stools.   Resp:    No coughing up of blood.     No chest wall deformity  Skin: no rash or lesions.  GU: no dysuria, change in color of urine, no urgency or frequency.  No flank pain, no hematuria   MS:  No joint pain or swelling.  No decreased range of motion.  No back pain.  Psych:  No change in mood or affect. No depression or anxiety.  No memory loss.                Objective:   Physical Exam   GEN: A/Ox3; pleasant , NAD, overweight   HEENT:  [place]/AT,  EACs-clear, TMs-wnl, NOSE-clear drainage , THROAT-clear, no lesions, no postnasal drip or exudate noted.   NECK:  Supple w/ fair ROM; no JVD; normal carotid impulses w/o bruits; no thyromegaly or nodules palpated; no lymphadenopathy.  RESP  Clear  P & A; w/o, wheezes/ rales/ or rhonchi.no  accessory muscle use, no dullness to percussion  CARD:  RRR, no m/r/g  , no peripheral edema, pulses intact, no cyanosis or clubbing.  GI:   Soft & nt; nml bowel sounds; no organomegaly or masses detected.  Musco: Warm bil, no deformities or joint swelling noted.   Neuro: alert, no focal deficits noted.    Skin: Warm, no lesions or rashes       Assessment & Plan:

## 2011-08-17 NOTE — Assessment & Plan Note (Addendum)
Uncontrolled b/p ? Side effect of new med for overactive bladder  Suspect may need to d/c if b/p not improving on rx   Plan;  Increase Benicar 40mg  daily .  Low salt diet  Avoid decongestants, NSAIDS .  Check blood pressure daily -keep log, call if b/p remains elevated >160  Contact Dr. Genice Rouge if b/p is not improving .  Follow up in 6 weeks and As needed

## 2011-08-29 ENCOUNTER — Encounter: Payer: 59 | Attending: Internal Medicine | Admitting: *Deleted

## 2011-08-29 ENCOUNTER — Encounter: Payer: Self-pay | Admitting: *Deleted

## 2011-08-29 DIAGNOSIS — E119 Type 2 diabetes mellitus without complications: Secondary | ICD-10-CM | POA: Insufficient documentation

## 2011-08-29 DIAGNOSIS — E1149 Type 2 diabetes mellitus with other diabetic neurological complication: Secondary | ICD-10-CM

## 2011-08-29 DIAGNOSIS — Z713 Dietary counseling and surveillance: Secondary | ICD-10-CM | POA: Insufficient documentation

## 2011-08-29 NOTE — Patient Instructions (Signed)
Goals:  Follow Diabetes Meal Plan as instructed  Eat 3 meals and 2 snacks, every 3-5 hrs  Limit carbohydrate intake to 45 grams carbohydrate/meal  Limit carbohydrate intake to 15 grams carbohydrate/snack  Add lean protein foods to meals/snacks  Monitor glucose levels as instructed by your doctor  Aim for 30 mins of physical activity daily  Bring food record and glucose log to your next nutrition visit  

## 2011-08-29 NOTE — Progress Notes (Signed)
  Patient was seen on 08/29/11 for the second of a series of three diabetes self-management courses at the [facility]. The following learning objectives were met by the patient during this course:   Explain basic nutrition maintenance and quality assurance  Describe causes, symptoms and treatment of hypoglycemia and hyperglycemia  Explain how to manage diabetes during illness  Describe the importance of good nutrition for health and healthy eating strategies  List strategies to follow meal plan when dining out  Describe the effects of alcohol on glucose and how to use it safely  Describe problem solving skills for day-to-day glucose challenges  Describe strategies to use when treatment plan needs to change  Identify important factors involved in successful weight loss  Describe ways to remain physically active  Describe the impact of regular activity on insulin resistance   Handouts given in class:  [facility] Oral medication/insulin handout  Follow-Up Plan: Patient will attend the final class of the [employer] Diabetes Self-Care Education.   

## 2011-09-01 ENCOUNTER — Encounter: Payer: Self-pay | Admitting: Adult Health

## 2011-09-01 ENCOUNTER — Ambulatory Visit (INDEPENDENT_AMBULATORY_CARE_PROVIDER_SITE_OTHER): Payer: 59 | Admitting: Adult Health

## 2011-09-01 VITALS — BP 142/70 | HR 88 | Temp 97.0°F | Ht 65.5 in | Wt 233.4 lb

## 2011-09-01 DIAGNOSIS — N318 Other neuromuscular dysfunction of bladder: Secondary | ICD-10-CM

## 2011-09-01 DIAGNOSIS — J309 Allergic rhinitis, unspecified: Secondary | ICD-10-CM

## 2011-09-01 DIAGNOSIS — N3281 Overactive bladder: Secondary | ICD-10-CM

## 2011-09-01 DIAGNOSIS — I10 Essential (primary) hypertension: Secondary | ICD-10-CM

## 2011-09-01 NOTE — Assessment & Plan Note (Signed)
May use zyrtec 10mg  At bedtime  As needed  Drainage - stop claritin  May use saline nasal rinses  Afrin Twice daily  X 3 days  Follow up in 6 weeks and As needed

## 2011-09-01 NOTE — Progress Notes (Signed)
Subjective:    Patient ID: Theresa Summers, female    DOB: May 06, 1940 .   MRN: [id number]  HPI 89 yowf never smoker with morbid obesity complicated by diabetes and hyperlipidemia. Her diabetes in turn has been complicated by painful paresthesias for which she has been treated previously with Lyric 75 in am 150 in pm with adequate control followed by Dr. Sharl Ma, Endocrinology.    03/14/2011 Acute OV  Complains of prod cough with thick gray mucus, feels "lousy", wheezing, sneezing x5days. OTC cold meds not helping. Cough is worse at night, keeping her up at night.  No fever. No hemoptysis. No vomitting.  Is due for CPX  rec Omnicef 300mg  Twice daily  For 7 days  Mucinex DM Twice daily  As needed  Cough/congestion  Saline nasal rinses As needed   Hydromet 1-2 tsp every 4-6 hr As neede  06/19/2011 f/u ov/Wert multiple chronic problems DM/Hypothyroid/ Hyperlipidemia Dr Sharl Ma complicated by DM neuropathy. HBP/ obesity followed by [name]. No sob, tia, claudication symptoms.  No longer coughing on nexium q am ac   08/17/2011 Acute OV  Complains of elevated BP x 6 weeks since beginning Myrbetriq-for overactive bladder  B/p running 170s at home .  Seen at urgent care last week, tx w/ ceftin for sinus infection  Had stopped benicar at last ov  With Dr. Sharl Ma 1 month ago ? Reason, no office notes (b/p on low side ? )  Side effects profile of Myrbetriq is elevated b/p.  No chest pain or edema.  No headache, visual changes.  Had labs last month -no reports available.  >>benicar increased to 40mg  daily   09/01/2011 Follow up  Returns for b/p check . B/p improved with benicar 40mg   Did decrease back to 20mg  due b/p at 100 and felt tired.  We discussed that Myrbetriq may be causing elevated b/p  She wants to try a different bladder rx.  Suffers for overactive bladder mainly at night.  Takes lasix in early am.  We discussed fluid and caffeine intake.  Complains of right ear ache mostly at night, throat  congestion > reports symptoms still lingering from sinus infection 6 weeks ago.   No discolored mucus . No fever  Ear feels stopped up . Taking claritin.     Past Medical History:  TRIGGER FINGER DEFORMITY (ICD-727.03)  HYPERLIPIDEMIA (ICD-272.4).......................................Marland Kitchen  Sharl Ma DM (ICD-250.00).............................................................Marland Kitchen  Sharl Ma  GERD (ICD-530.81)  VENOUS INSUFFICIENCY, CHRONIC (ICD-459.81)  OBESITY, MORBID (ICD-278.01)  Health Maintenance............................................................  Wert -DT 12/2004 -Pneumovax 11/06 and 2011 -DEXA 01/22/06 nl, repeat 02/16/08 also nl -CPX 06/19/2011  -GYN  [name]   ROS:  Constitutional:   No  weight loss, night sweats,  Fevers, chills,  +fatigue, or  lassitude.  HEENT:   No headaches,  Difficulty swallowing,  Tooth/dental problems, or  Sore throat,                No sneezing, itching,  +ear ache,  +nasal congestion, post nasal drip,   CV:  No chest pain,  Orthopnea, PND, swelling in lower extremities, anasarca, dizziness, palpitations, syncope.   GI  No heartburn, indigestion, abdominal pain, nausea, vomiting, diarrhea, change in bowel habits, loss of appetite, bloody stools.   Resp:    No coughing up of blood.     No chest wall deformity  Skin: no rash or lesions.  GU: no dysuria, change in color of urine,  No flank pain, no hematuria   MS:  No joint pain or swelling.  No  decreased range of motion.  No back pain.  Psych:  No change in mood or affect. No depression or anxiety.  No memory loss.                Objective:   Physical Exam   GEN: A/Ox3; pleasant , NAD, overweight   HEENT:  [place]/AT,  EACs-clear, TMs-wnl, NOSE-clear drainage , THROAT-clear, no lesions, no postnasal drip or exudate noted.   NECK:  Supple w/ fair ROM; no JVD; normal carotid impulses w/o bruits; no thyromegaly or nodules palpated; no lymphadenopathy.  RESP  Clear  P & A; w/o, wheezes/  rales/ or rhonchi.no accessory muscle use, no dullness to percussion  CARD:  RRR, no m/r/g  , no peripheral edema, pulses intact, no cyanosis or clubbing.  GI:   Soft & nt; nml bowel sounds; no organomegaly or masses detected.  Musco: Warm bil, no deformities or joint swelling noted.   Neuro: alert, no focal deficits noted.    Skin: Warm, no lesions or rashes       Assessment & Plan:

## 2011-09-01 NOTE — Assessment & Plan Note (Signed)
Difficulty with elevated b/p -spiked after starting Myrbetriq-overactive bladder rx  Common side effect myrbetriq is b/p rise.  Will stop this , but keep benicar at 20mg  due to suspect drop in b/p off myrbetriq.  Labs done at dr Sharl Ma office -need copy of this or advised pt will need some routine labs at our office in future    Plan Stop Myrbetriq    Continue on Benicar 20mg  daily .  Low salt diet  Avoid decongestants, NSAIDS .  Check blood pressure daily -keep log, call if b/p >160 or <90   Follow up in 6 weeks and As needed   Please get labs from DR. Sharl Ma office

## 2011-09-01 NOTE — Patient Instructions (Addendum)
Stop Myrbetriq  Begin Toviaz 4mg  daily for overactive bladder.  Continue on Benicar 20mg  daily .  Low salt diet  Avoid decongestants, NSAIDS .  Check blood pressure daily -keep log, call if b/p >160 or <90  May use zyrtec 10mg  At bedtime  As needed  Drainage - stop claritin  May use saline nasal rinses  Afrin Twice daily  X 3 days  Follow up in 6 weeks and As needed   Please get labs from DR. Sharl Ma office

## 2011-09-01 NOTE — Assessment & Plan Note (Signed)
Stop Myrbetriq due elevated b/p  Begin Toviaz 4mg  daily for overactive bladder.

## 2011-09-20 NOTE — Progress Notes (Signed)
  Patient was seen on 09/12/2011 for the third of a series of three diabetes self-management courses at the [facility]. The following learning objectives were met by the patient during this course:    Describe how diabetes changes over time   Identify diabetes complications and ways to prevent them   Describe strategies that can promote heart health including lowering blood pressure and cholesterol   Describe strategies to lower dietary fat and sodium in the diet   Identify physical activities that benefit cardiovascular health   Evaluate success in meeting personal goal   Describe the belief that they can live successfully with diabetes day to day   Establish 2-3 goals that they will plan to diligently work on until they return for the free 7-month follow-up visit  The following handouts were given in class:  3 Month Follow Up Visit handout  Goal setting handout  Class evaluation form  Your patient has established the following 3 month goals for diabetes self-care:  Count carbohydrates at most of my meals and snacks.  Follow-Up Plan: Patient will attend a 3 month follow-up visit for diabetes self-management education.

## 2011-09-29 ENCOUNTER — Ambulatory Visit: Payer: 59 | Admitting: Adult Health

## 2011-10-06 ENCOUNTER — Ambulatory Visit (INDEPENDENT_AMBULATORY_CARE_PROVIDER_SITE_OTHER): Payer: 59 | Admitting: Adult Health

## 2011-10-06 ENCOUNTER — Ambulatory Visit: Payer: 59 | Admitting: Adult Health

## 2011-10-06 ENCOUNTER — Encounter: Payer: Self-pay | Admitting: Adult Health

## 2011-10-06 VITALS — BP 116/60 | HR 76 | Temp 97.7°F | Ht 65.5 in | Wt 232.5 lb

## 2011-10-06 DIAGNOSIS — I10 Essential (primary) hypertension: Secondary | ICD-10-CM

## 2011-10-06 DIAGNOSIS — N3281 Overactive bladder: Secondary | ICD-10-CM

## 2011-10-06 DIAGNOSIS — N318 Other neuromuscular dysfunction of bladder: Secondary | ICD-10-CM

## 2011-10-06 NOTE — Progress Notes (Signed)
Subjective:    Patient ID: Theresa Summers, female    DOB: 16-Aug-1940 .   MRN: [id number]  HPI 35 yowf never smoker with morbid obesity complicated by diabetes and hyperlipidemia. Her diabetes in turn has been complicated by painful paresthesias for which she has been treated previously with Lyric 75 in am 150 in pm with adequate control followed by Dr. Sharl Ma, Endocrinology.    03/14/2011 Acute OV  Complains of prod cough with thick gray mucus, feels "lousy", wheezing, sneezing x5days. OTC cold meds not helping. Cough is worse at night, keeping her up at night.  No fever. No hemoptysis. No vomitting.  Is due for CPX  rec Omnicef 300mg  Twice daily  For 7 days  Mucinex DM Twice daily  As needed  Cough/congestion  Saline nasal rinses As needed   Hydromet 1-2 tsp every 4-6 hr As neede  06/19/2011 f/u ov/Wert multiple chronic problems DM/Hypothyroid/ Hyperlipidemia Dr Sharl Ma complicated by DM neuropathy. HBP/ obesity followed by [name]. No sob, tia, claudication symptoms.  No longer coughing on nexium q am ac   08/17/2011 Acute OV  Complains of elevated BP x 6 weeks since beginning Myrbetriq-for overactive bladder  B/p running 170s at home .  Seen at urgent care last week, tx w/ ceftin for sinus infection  Had stopped benicar at last ov  With Dr. Sharl Ma 1 month ago ? Reason, no office notes (b/p on low side ? )  Side effects profile of Myrbetriq is elevated b/p.  No chest pain or edema.  No headache, visual changes.  Had labs last month -no reports available.  >>benicar increased to 40mg  daily   09/01/2011 Follow up  Returns for b/p check . B/p improved with benicar 40mg   Did decrease back to 20mg  due b/p at 100 and felt tired.  We discussed that Myrbetriq may be causing elevated b/p  She wants to try a different bladder rx.  Suffers for overactive bladder mainly at night.  Takes lasix in early am.  We discussed fluid and caffeine intake.  Complains of right ear ache mostly at night, throat  congestion > reports symptoms still lingering from sinus infection 6 weeks ago.   No discolored mucus . No fever  Ear feels stopped up . Taking claritin.  >stopped Marianne Sofia rx  benicar 20mg     10/06/2011 Follow up Returns for 6 week follow up HTN  BPs at home still slightly elevated on benicar 20mg   avg b/p 102-150 on new machine  Today in office b/p 116/60 Does notice  toviaz not working as well as the [employer] but not enough to change  On average goes 1-2 times nightly .  No chest pain , dyspnea or headache.  She declines labs as she gets all her blood work from Dr. Sharl Ma (she will request copies be sent to our office for review )     Past Medical History:  TRIGGER FINGER DEFORMITY (ICD-727.03)  HYPERLIPIDEMIA (ICD-272.4).......................................Marland Kitchen  Sharl Ma DM (ICD-250.00).............................................................Marland Kitchen  Sharl Ma  GERD (ICD-530.81)  VENOUS INSUFFICIENCY, CHRONIC (ICD-459.81)  OBESITY, MORBID (ICD-278.01)  Health Maintenance............................................................  Wert -DT 12/2004 -Pneumovax 11/06 and 2011 -DEXA 01/22/06 nl, repeat 02/16/08 also nl -CPX 06/19/2011  -GYN  [name]   ROS:  Constitutional:   No  weight loss, night sweats,  Fevers, chills,  +fatigue, or  lassitude.  HEENT:   No headaches,  Difficulty swallowing,  Tooth/dental problems, or  Sore throat,  No sneezing, itching,  +ear ache,  +nasal congestion, post nasal drip,   CV:  No chest pain,  Orthopnea, PND, swelling in lower extremities, anasarca, dizziness, palpitations, syncope.   GI  No heartburn, indigestion, abdominal pain, nausea, vomiting, diarrhea, change in bowel habits, loss of appetite, bloody stools.   Resp:    No coughing up of blood.     No chest wall deformity  Skin: no rash or lesions.  GU: no dysuria, change in color of urine,  No flank pain, no hematuria   MS:  No joint pain or swelling.  No decreased  range of motion.  No back pain.  Psych:  No change in mood or affect. No depression or anxiety.  No memory loss.                Objective:   Physical Exam   GEN: A/Ox3; pleasant , NAD, overweight   HEENT:  [place]/AT,  EACs-clear, TMs-wnl, NOSE-clear drainage , THROAT-clear, no lesions, no postnasal drip or exudate noted.   NECK:  Supple w/ fair ROM; no JVD; normal carotid impulses w/o bruits; no thyromegaly or nodules palpated; no lymphadenopathy.  RESP  Clear  P & A; w/o, wheezes/ rales/ or rhonchi.no accessory muscle use, no dullness to percussion  CARD:  RRR, no m/r/g  , no peripheral edema, pulses intact, no cyanosis or clubbing.  GI:   Soft & nt; nml bowel sounds; no organomegaly or masses detected.  Musco: Warm bil, no deformities or joint swelling noted.   Neuro: alert, no focal deficits noted.    Skin: Warm, no lesions or rashes       Assessment & Plan:

## 2011-10-06 NOTE — Assessment & Plan Note (Signed)
Cont on current regimen  Advised if worsens to follow up back with GYN

## 2011-10-06 NOTE — Assessment & Plan Note (Signed)
Appears to be controlled  Advised on diet and execise with weight loss.  Cont on benicar  Call if b/p >140-150 consistently.  Labs results request from Dr. Sharl Ma

## 2011-10-06 NOTE — Patient Instructions (Addendum)
Continue on current regimen.  follow up in 3 months and As needed

## 2011-10-12 ENCOUNTER — Telehealth: Payer: Self-pay | Admitting: Internal Medicine

## 2011-10-12 MED ORDER — FESOTERODINE FUMARATE ER 4 MG PO TB24: 4.0000 mg | ORAL_TABLET | Freq: Every day | ORAL | Status: DC

## 2011-10-12 NOTE — Telephone Encounter (Signed)
PT notified that rx for Theresa Summers was sent to pharmacy.

## 2011-10-13 ENCOUNTER — Ambulatory Visit: Payer: 59 | Admitting: Adult Health

## 2011-11-03 ENCOUNTER — Emergency Department (INDEPENDENT_AMBULATORY_CARE_PROVIDER_SITE_OTHER)
Admission: EM | Admit: 2011-11-03 | Discharge: 2011-11-03 | Disposition: A | Discharge status: Home / Self Care | Payer: 59 | Source: Home / Self Care | Attending: Emergency Medicine | Admitting: Emergency Medicine

## 2011-11-03 ENCOUNTER — Encounter (HOSPITAL_COMMUNITY): Payer: Self-pay | Admitting: *Deleted

## 2011-11-03 ENCOUNTER — Emergency Department (INDEPENDENT_AMBULATORY_CARE_PROVIDER_SITE_OTHER): Payer: 59

## 2011-11-03 DIAGNOSIS — M545 Low back pain: Secondary | ICD-10-CM

## 2011-11-03 LAB — POCT URINALYSIS DIP (DEVICE)
Bilirubin Urine: NEGATIVE
Glucose, UA: NEGATIVE mg/dL
Hgb urine dipstick: NEGATIVE
Ketones, ur: NEGATIVE mg/dL
Nitrite: NEGATIVE
Specific Gravity, Urine: 1.01 (ref 1.005–1.030)
pH: 6 (ref 5.0–8.0)

## 2011-11-03 MED ORDER — MELOXICAM 7.5 MG PO TABS: 7.5000 mg | ORAL_TABLET | Freq: Every day | ORAL | Status: DC

## 2011-11-03 MED ORDER — TRAMADOL HCL 50 MG PO TABS: 100.0000 mg | ORAL_TABLET | Freq: Three times a day (TID) | ORAL | Status: AC | PRN

## 2011-11-03 NOTE — ED Notes (Signed)
Pt reports lower back with no known injury - sharp stabbing pain. Pt is unable to tell if she is having urinary frequency or urgency due to taking lasix.

## 2011-11-03 NOTE — ED Provider Notes (Signed)
Chief Complaint  Patient presents with  . Back Pain    History of Present Illness:   Theresa Summers is a 71 year old female with a four-day history of right mid lumbar pain. The pain comes and goes. It's a sharp pain and can last for seconds to minutes at a time. She denies any injury to the back. The pain is worse with movement but sometimes comes on at rest. There is nothing particular that helps her pain. She try some Celebrex without much improvement. The pain is severe at times. She denies any radiation down the leg or to the abdomen. There is no numbness, tingling, or weakness in lower extremities. She denies any dysuria, frequency, urgency, or incontinence of urine. No hematuria. No incontinence of stool. She denies any saddle anesthesia. She's had no abdominal pain. No fever, chills, or unintentional weight loss. She denies any history of cancer, osteoporosis, or immunosuppression. She does have a history of kidney stones. She has diabetes and hypertension. She is allergic to Percocet and Zithromax. She had a lumbar laminectomy done by Dr. Arturo Morton back in the 1980s.  Review of Systems:  Other than noted above, the patient denies any of the following symptoms: Systemic:  No fever, chills, severe fatigue, or unexplained weight loss. GI:  No abdominal pain, nausea, vomiting, diarrhea, constipation, incontinence of bowel, or blood in stool. GU:  No dysuria, frequency, urgency, or hematuria. No incontinence of urine or difficulty urinating.  M-S:  No neck pain, joint pain, arthritis, or myalgias. Neuro:  No paresthesias, saddle anesthesia, muscular weakness, or progressive neurological deficit.  PMFSH:  Past medical history, family history, social history, meds, and allergies were reviewed. Specifically, there is no history of cancer, major trauma, osteoporosis, immunosuppression, HIV, or IV or injection drug use.   Physical Exam:   Vital signs:  BP 135/63  Pulse 80  Temp 98 F (36.7 C) (Oral)   Resp 18  SpO2 96% General:  Alert, oriented, in no distress. Abdomen:  Soft, non-tender.  No organomegaly or mass.  No pulsatile midline abdominal mass or bruit. Back:  There is no pain to palpation in the lumbar spine. She localizes the maximal tenderness over the mid right lumbar spine area just lateral to the midline. There is no midline pain. She does have a midline scar from her previous surgery back in the 1980s. Her back has fairly good flexion at 85, 10 of extension, 10 of lateral bending, and 85 of rotation without any pain. Straight leg raising is negative. Neuro:  Normal muscle strength, sensations and knee reflexes were 2+, ankle reflexes were absent bilaterally. Extremities: Pedal pulses were full, there was no edema. Skin:  Clear, warm and dry.  No rash.    Labs:   Results for orders placed during the hospital encounter of 11/03/11  POCT URINALYSIS DIP (DEVICE)      Component Value Range   Glucose, UA NEGATIVE  NEGATIVE mg/dL   Bilirubin Urine NEGATIVE  NEGATIVE   Ketones, ur NEGATIVE  NEGATIVE mg/dL   Specific Gravity, Urine 1.010  1.005 - 1.030   Hgb urine dipstick NEGATIVE  NEGATIVE   pH 6.0  5.0 - 8.0   Protein, ur NEGATIVE  NEGATIVE mg/dL   Urobilinogen, UA 0.2  0.0 - 1.0 mg/dL   Nitrite NEGATIVE  NEGATIVE   Leukocytes, UA SMALL (*) NEGATIVE    Other Labs Obtained at Urgent Care Center:  A urine culture was obtained.  Results are pending at this time and we will  call about any positive results.   Radiology:  Dg Lumbar Spine Complete  11/03/2011  *RADIOLOGY REPORT*  Clinical Data: 4-day history of right-sided low back pain.  No known injuries.  Remote L4-5 surgery.  LUMBAR SPINE - COMPLETE 4+ VIEW  Comparison: None.  Findings: A prior chest x-ray 01/10/2010 demonstrated 12 rib- bearing thoracic vertebrae.  Therefore, there are 6 non-rib bearing lumbar vertebrae, with L6 representing a transitional lumbosacral segment, having well-defined assimilation joints between  its transverse processes and the first sacral segment.  If there are imaging studies elsewhere with a different numbering scheme, please adjust accordingly.  Anatomic alignment.  No acute fractures.  Severe disc space narrowing and endplate hypertrophic changes at L4-5, L5-6. Mild to moderate disc space narrowing endplate hypertrophic changes at L1- 2, L2-3.  Facet degenerative changes at L4-5, L5-6.  Visualized sacroiliac joints intact.  Note made of multiple calcified gallstones in the right upper quadrant.  IMPRESSION:  1.  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 2.  No acute osseous abnormality. 3.  Severe degenerative disc disease and spondylosis at L4-5 and L5- 6.  Mild to moderate degenerative disc disease at L1-2 and L2-3. Facet degenerative changes at L4-5 and L5-6. 4.  Cholelithiasis.   Original Report Authenticated By: Arnell Sieving, M.D.     Assessment:  The encounter diagnosis was Lumbar pain.  The most likely cause of the pain is musculoskeletal, possibly due to disc disease or to facet arthritis. There is nothing right now to point towards kidney stones but this is a possibility and I suggested that if the pain persisted or got worse that she go to the emergency room at the hospital for a CT urogram. The meantime she was put on back exercises, anti-inflammatories, and pain meds and suggested she followup with her primary care physician in 2 weeks.  Plan:   1.  The following meds were prescribed:   New Prescriptions   MELOXICAM (MOBIC) 7.5 MG TABLET    Take 1 tablet (7.5 mg total) by mouth daily.   TRAMADOL (ULTRAM) 50 MG TABLET    Take 2 tablets (100 mg total) by mouth every 8 (eight) hours as needed for pain.   2.  The patient was instructed in symptomatic care and handouts were given. 3.  The patient was told to return if becoming worse in any way, if no better in 2 weeks, and given some red flag symptoms that would indicate  earlier return. 4.  The patient was encouraged to try to be as active as possible and given some exercises to do followed by moist heat.    Reuben Likes, MD 11/03/11 1536

## 2011-11-05 LAB — URINE CULTURE: Special Requests: NORMAL

## 2011-12-19 ENCOUNTER — Other Ambulatory Visit: Payer: Self-pay | Admitting: Internal Medicine

## 2011-12-19 DIAGNOSIS — Z1231 Encounter for screening mammogram for malignant neoplasm of breast: Secondary | ICD-10-CM

## 2012-01-05 ENCOUNTER — Encounter: Payer: Self-pay | Admitting: Adult Health

## 2012-01-05 ENCOUNTER — Ambulatory Visit (INDEPENDENT_AMBULATORY_CARE_PROVIDER_SITE_OTHER): Payer: 59 | Admitting: Adult Health

## 2012-01-05 VITALS — BP 118/58 | HR 71 | Temp 98.9°F | Ht 66.5 in | Wt 237.8 lb

## 2012-01-05 DIAGNOSIS — N318 Other neuromuscular dysfunction of bladder: Secondary | ICD-10-CM

## 2012-01-05 DIAGNOSIS — I10 Essential (primary) hypertension: Secondary | ICD-10-CM

## 2012-01-05 DIAGNOSIS — E119 Type 2 diabetes mellitus without complications: Secondary | ICD-10-CM

## 2012-01-05 DIAGNOSIS — E785 Hyperlipidemia, unspecified: Secondary | ICD-10-CM

## 2012-01-05 DIAGNOSIS — N3281 Overactive bladder: Secondary | ICD-10-CM

## 2012-01-05 MED ORDER — VALACYCLOVIR HCL 1 G PO TABS: 2000.0000 mg | ORAL_TABLET | Freq: Two times a day (BID) | ORAL | Status: AC

## 2012-01-05 MED ORDER — OLMESARTAN MEDOXOMIL 40 MG PO TABS: 40.0000 mg | ORAL_TABLET | Freq: Every day | ORAL | Status: DC

## 2012-01-05 MED ORDER — ESOMEPRAZOLE MAGNESIUM 40 MG PO CPDR: 40.0000 mg | DELAYED_RELEASE_CAPSULE | Freq: Every day | ORAL | Status: DC

## 2012-01-05 NOTE — Assessment & Plan Note (Signed)
Improved compensation on [employer]

## 2012-01-05 NOTE — Assessment & Plan Note (Signed)
Improved control. Advised a low-salt diet, exercise, and weight loss

## 2012-01-05 NOTE — Assessment & Plan Note (Signed)
Controlled on therapy. Continue on diet, and exercise. Labs per Dr. Sharl Ma office

## 2012-01-05 NOTE — Progress Notes (Signed)
Subjective:    Patient ID: Theresa Summers, female    DOB: 25-Dec-1940 .   MRN: [id number]  HPI 43 yowf never smoker with morbid obesity complicated by diabetes and hyperlipidemia. Her diabetes in turn has been complicated by painful paresthesias for which she has been treated previously with Lyric 75 in am 150 in pm with adequate control followed by Dr. Sharl Ma, Endocrinology.    03/14/2011 Acute OV  Complains of prod cough with thick gray mucus, feels "lousy", wheezing, sneezing x5days. OTC cold meds not helping. Cough is worse at night, keeping her up at night.  No fever. No hemoptysis. No vomitting.  Is due for CPX  rec Omnicef 300mg  Twice daily  For 7 days  Mucinex DM Twice daily  As needed  Cough/congestion  Saline nasal rinses As needed   Hydromet 1-2 tsp every 4-6 hr As neede  06/19/2011 f/u ov/[facility] multiple chronic problems DM/Hypothyroid/ Hyperlipidemia Dr Sharl Ma complicated by DM neuropathy. HBP/ obesity followed by [name]. No sob, tia, claudication symptoms.  No longer coughing on nexium q am ac   08/17/2011 Acute OV  Complains of elevated BP x 6 weeks since beginning Myrbetriq-for overactive bladder  B/p running 170s at home .  Seen at urgent care last week, tx w/ ceftin for sinus infection  Had stopped benicar at last ov  With Dr. Sharl Ma 1 month ago ? Reason, no office notes (b/p on low side ? )  Side effects profile of Myrbetriq is elevated b/p.  No chest pain or edema.  No headache, visual changes.  Had labs last month -no reports available.  >>benicar increased to 40mg  daily   09/01/2011 Follow up  Returns for b/p check . B/p improved with benicar 40mg   Did decrease back to 20mg  due b/p at 100 and felt tired.  We discussed that Myrbetriq may be causing elevated b/p  She wants to try a different bladder rx.  Suffers for overactive bladder mainly at night.  Takes lasix in early am.  We discussed fluid and caffeine intake.  Complains of right ear ache mostly at night, throat  congestion > reports symptoms still lingering from sinus infection 6 weeks ago.   No discolored mucus . No fever  Ear feels stopped up . Taking claritin.  >stopped Marianne Sofia rx  benicar 20mg     10/06/2011 Follow up Returns for 6 week follow up HTN  BPs at home still slightly elevated on benicar 20mg   avg b/p 102-150 on new machine  Today in office b/p 116/60 Does notice  toviaz not working as well as the [employer] but not enough to change  On average goes 1-2 times nightly .  No chest pain , dyspnea or headache.  She declines labs as she gets all her blood work from Dr. Sharl Ma (she will request copies be sent to our office for review )  >>no changes   01/05/2012 Follow up  Patient returns for a three-month routine followup. She's been having difficulty with elevated blood pressure. Since beginning her Mibertriq earlier this year for overactive bladder. This was discontinued and she was changed over to [place] .  Bladder symptoms are improved with decreased nocturia Blood pressure is much improved averaging around 110 to 120s systolic She denies any chest pain, hemoptysis, orthopnea, PND, leg swelling, weight loss, bloody stools, or edema. She is followed by Dr. Sharl Ma for her diabetes, hyperlipidemia Recent labs. Patient reports her hemoglobin A1c was 6.9 Record review reveals total cholesterol at 150 triglycerides was 61,  LDL at 73, on Zocor Once again, we discussed routine colonoscopy for screening purposes. However, patient declines at this time. 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  [facility] -DT 12/2004 -Pneumovax 11/06 and 2011 -DEXA 01/22/06 nl, repeat 02/16/08 also nl -CPX 06/19/2011  -GYN  [name] -Flu 11/2011  -refuses colonoscopy 01/05/2012  -Mammogram 01/2011 nml   ROS:  Constitutional:   No  weight loss, night sweats,  Fevers, chills, fatigue, or  lassitude.  HEENT:   No headaches,  Difficulty swallowing,  Tooth/dental problems, or  Sore throat,                No sneezing, itching, ear ache, nasal congestion, post nasal drip,   CV:  No chest pain,  Orthopnea, PND, swelling in lower extremities, anasarca, dizziness, palpitations, syncope.   GI  No heartburn, indigestion, abdominal pain, nausea, vomiting, diarrhea, change in bowel habits, loss of appetite, bloody stools.   Resp:    No coughing up of blood.     No chest wall deformity  Skin: no rash or lesions.  GU: no dysuria, change in color of urine,  No flank pain, no hematuria   MS:  No joint pain or swelling.  No decreased range of motion.  No back pain.  Psych:  No change in mood or affect. No depression or anxiety.  No memory loss.                Objective:   Physical Exam   GEN: A/Ox3; pleasant , NAD, overweight   HEENT:  [place]/AT,  EACs-clear, TMs-wnl, NOSE-clear drainage , THROAT-clear, no lesions, no postnasal drip or exudate noted.   NECK:  Supple w/ fair ROM; no JVD; normal carotid impulses w/o bruits; no thyromegaly or nodules palpated; no lymphadenopathy.  RESP  Clear  P & A; w/o, wheezes/ rales/ or rhonchi.no accessory muscle use, no dullness to percussion  CARD:  RRR, no m/r/g  , no peripheral edema, pulses intact, no cyanosis or clubbing.  GI:   Soft & nt; nml bowel sounds; no organomegaly or masses detected.  Musco: Warm bil, no deformities or joint swelling noted.   Neuro: alert, no focal deficits noted.    Skin: Warm, no lesions or rashes       Assessment & Plan:

## 2012-01-05 NOTE — Patient Instructions (Addendum)
Keep up good work  Return stool cards -please  Follow up in 6 months

## 2012-01-05 NOTE — Assessment & Plan Note (Signed)
Controlled on therapy. Followup with Dr. Sharl Ma as planned and as needed Labs through his office

## 2012-01-09 ENCOUNTER — Telehealth: Payer: Self-pay | Admitting: Adult Health

## 2012-01-09 DIAGNOSIS — I872 Venous insufficiency (chronic) (peripheral): Secondary | ICD-10-CM

## 2012-01-09 NOTE — Telephone Encounter (Signed)
Handwritten order/rx given to MW to sign and fax to [facility] pair knee high compression hose (30-10mm)  FAX [phone]  ATTN: CATHEY  TP not in office 11/20 so Rx given to MW to sign.  Rx then needs to be scanned into pt chart.

## 2012-01-09 NOTE — Telephone Encounter (Signed)
That is fine  Sorry I meant to send this last ov

## 2012-01-09 NOTE — Telephone Encounter (Signed)
Phone call from patient wanting two pair knee high compression hose (30-29mm) ordered and faxed to [facility]. FAX [phone] ATTN CATHEY   Please advise if okay to send order. Patient states that you mentioned sending order at last OV. Thanks.

## 2012-01-26 ENCOUNTER — Ambulatory Visit
Admission: RE | Admit: 2012-01-26 | Discharge: 2012-01-26 | Disposition: A | Discharge status: Home / Self Care | Payer: 59 | Source: Ambulatory Visit | Attending: Internal Medicine | Admitting: Internal Medicine

## 2012-01-26 DIAGNOSIS — Z1231 Encounter for screening mammogram for malignant neoplasm of breast: Secondary | ICD-10-CM

## 2012-02-21 DIAGNOSIS — K8689 Other specified diseases of pancreas: Secondary | ICD-10-CM

## 2012-02-21 HISTORY — DX: Other specified diseases of pancreas: K86.89

## 2012-03-04 ENCOUNTER — Telehealth: Payer: Self-pay | Admitting: Adult Health

## 2012-03-04 NOTE — Telephone Encounter (Signed)
lmtcb x1 for pt on home #. Pt work has closed

## 2012-03-05 NOTE — Telephone Encounter (Signed)
I see nowhere in the chart where pt has been on Losartan 25 mg. The pt states Dr. Sharl Ma started her on this and she has been taking this for some time. I have discussed this with Katheren Shams. and she will talk with TP about this. Our records have always shown the pt to be on Benicar.

## 2012-03-05 NOTE — Telephone Encounter (Signed)
Pt will be back around 1:15 [phone] returning call

## 2012-03-05 NOTE — Telephone Encounter (Signed)
Per scanned-in ov notes from Dr Daune Perch office, on 5.24.13 pt was to stop the benicar 40mg  1/2tabQD and begin losartan 25mg  1tabQD.  Per Dr Daune Perch next ov note on 8.30.13 pt was still on the benicar AND losartan per that med list. Per the 6.27.13 ov w/ TP: 08/17/2011 Acute OV   Complains of elevated BP x 6 weeks since beginning Myrbetriq-for overactive bladder   B/p running 170s at home .   Seen at urgent care last week, tx w/ ceftin for sinus infection   Had stopped benicar at last ov  With Dr. Sharl Ma 1 month ago ? Reason, no office notes (b/p on low side ? )   Side effects profile of Myrbetriq is elevated b/p.   No chest pain or edema.   No headache, visual changes.   Had labs last month -no reports available.  Patient Instructions     Increase Benicar 40mg  daily .   Low salt diet   Avoid decongestants, NSAIDS .   Check blood pressure daily -keep log, call if b/p remains elevated >160   Contact Dr. Genice Rouge if b/p is not improving .   Follow up in 6 weeks and As needed     There is no mention in the above note that pt was started on Losartan.  Per Dr Daune Perch note, the losartan was written for #90 w/ 3 refills but unable to discern where the rx was sent.  Need to find this out and also the pharmacy that is questioning this.  Pt does not establish with her new PCP Dr Ruthe Mannan until 05/2012.  LM w/ coworker TCB x1.

## 2012-03-06 NOTE — Telephone Encounter (Signed)
Called spoke with patient who reported that she is currently taking BOTH benicar 40mg  qd and losartan 25mg  qd.  Both medications started by Dr Sharl Ma.  Advised pt that we were never aware that she was started on losartan IN PLACE OF the benicar.  Per TP: this may have resulted from an insurance issue - wouldn't pay for benicar so was changed to losartan.  STOP the benicar and CONTINUE the loasrtan 25mg  qd.  Patient verbalized her understanding and denied any further questions.  Advised will call her pharmacy [facility]) to inform them of this change.  Called spoke with Crystal who is "suspending the prescription."  Nothing further needed; will sign off.  MAR updated.

## 2012-03-27 ENCOUNTER — Other Ambulatory Visit: Payer: Self-pay | Admitting: Adult Health

## 2012-03-27 ENCOUNTER — Ambulatory Visit: Payer: Self-pay | Admitting: Family Medicine

## 2012-04-01 NOTE — Telephone Encounter (Signed)
Per pt's chart, has not yet established with new PCP

## 2012-04-03 ENCOUNTER — Encounter: Payer: Self-pay | Admitting: Pharmacist

## 2012-04-03 DIAGNOSIS — E119 Type 2 diabetes mellitus without complications: Secondary | ICD-10-CM

## 2012-04-03 NOTE — Progress Notes (Signed)
  Patient presents today for 3 mo DM follow-up as part of the employer sponsored [employer]. Current regimen includes Victoza, Metformin, and Lantus. Patient also remains on daily ASA, ARB, and statin. She was last seen by Dr. Sharl Ma, endocrinologist, about 2 months ago. She is seen by him every three months and will follow-up again in March. Will now be seeing Dr. Dayton Martes as PCP, as her current Dr. [name] is now specializing in pulmonology. Patient denies medication issues and has no new medications at this time.    Diabetes Assessment: Currently testing 2-3 times daily.; Lowest CBG 53; Highest CBG 195;  A1c - 6.9 on 03/27/12 via point-of-care in pharmacy.  Other Diabetes History: Patient did not bring her meter today, but reports averages of 130s in the AM, fasting. Usually tests fasting, after getting home, and sometmes before bed. Usually hypoglycemia is due to meal skipping. Pt is aware of symptoms. Is aware of how to properly correct low glucose, sometimes drinks orange juice. She does admit that she sometimes corrects hypoglycemia with a miniature candy bar. We have discussed why this is not optimal. Cannot recall many hyperglycemic episodes.   Lifestyle Factors: Diet - Reports that eating a healthy evening meal is a struggle. Usually involves something quick, often a sandwich and often sweets such as icecream. Icecream usually each night at bedtime. Often chooses sugar-free candy. Protein bar for breakfast or special K with banana and milk. Lunch - lean cuisine. Beverages - [employer] tea 1/2 sweet, water throughout the day, 2% milk.   Exercise - walking is limited due to neuropathic pain in lower legs. Does own a stationary bike, but time is limiting factor. 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   Assessment: Patient is doing well today with at-goal A1c of 6.9. She has agreed to making some changes in diet and exercise and will work on these over the course of the next few  months. Patient is under a great deal of stress right now while being caretaker of various family members, and her glucose has reflected this lack of time and effort toward her own health. I look forward to working with this patient.  Plan: 1) Attempt to make healthy dietary choices, especially with evening meal.  2) Limit portion size of icecream and sweets  3) Attempt to increase exercise 4) Follow-up in 3 months on Wednesday, May 14th @ 1:30 pm

## 2012-04-03 NOTE — Progress Notes (Signed)
Patient ID: Theresa Summers, female   DOB: 04/28/1940, 71 y.o.   MRN: [id number] Reviewed: Agree with documentation and management. 

## 2012-04-30 NOTE — Progress Notes (Unsigned)
Patient ID: Theresa Summers, female   DOB: November 11, 1940, 72 y.o.   MRN: [id number] Reviewed: Agree with documentation and management.

## 2012-05-14 NOTE — Progress Notes (Unsigned)
This encounter was created in error - please disregard.

## 2012-05-28 ENCOUNTER — Encounter: Payer: Self-pay | Admitting: Family Medicine

## 2012-05-28 ENCOUNTER — Ambulatory Visit (INDEPENDENT_AMBULATORY_CARE_PROVIDER_SITE_OTHER): Payer: 59 | Admitting: Family Medicine

## 2012-05-28 VITALS — BP 160/62 | HR 76 | Temp 98.1°F | Ht 66.5 in | Wt 241.0 lb

## 2012-05-28 DIAGNOSIS — E119 Type 2 diabetes mellitus without complications: Secondary | ICD-10-CM

## 2012-05-28 DIAGNOSIS — E785 Hyperlipidemia, unspecified: Secondary | ICD-10-CM

## 2012-05-28 DIAGNOSIS — E039 Hypothyroidism, unspecified: Secondary | ICD-10-CM | POA: Insufficient documentation

## 2012-05-28 DIAGNOSIS — K219 Gastro-esophageal reflux disease without esophagitis: Secondary | ICD-10-CM | POA: Insufficient documentation

## 2012-05-28 DIAGNOSIS — I10 Essential (primary) hypertension: Secondary | ICD-10-CM

## 2012-05-28 DIAGNOSIS — E1149 Type 2 diabetes mellitus with other diabetic neurological complication: Secondary | ICD-10-CM

## 2012-05-28 MED ORDER — SIMVASTATIN 20 MG PO TABS: 20.0000 mg | ORAL_TABLET | Freq: Every day | ORAL | Status: DC

## 2012-05-28 MED ORDER — DOXAZOSIN MESYLATE 8 MG PO TABS: 8.0000 mg | ORAL_TABLET | ORAL | Status: DC

## 2012-05-28 NOTE — Progress Notes (Signed)
Subjective:    Patient ID: Theresa Summers, female    DOB: 03/06/1940, 72 y.o.   MRN: [id number]  HPI  Very pleasant 72 yo female here to establish care.  DM- followed by Dr. Sharl Ma.  Sees him every 3 months, has appt to see him in 2 weeks.  Last a1c was 6.8.  Does check her CBGs daily, fasting- ranges 92-134.  Denies any episodes of hypoglycemia.  On Metformin 1000 mg twice daily, Lantus 70 units daily and Victoza 1.8 mg daily. Does have significant neuropathy- takes lyrica which does help.  Hypothyroidism- on synthroid 88 mcg daily. Per pt, Dr. Sharl Ma increased dose last year.  Denies any symptoms of hypo or hyperthyroidism.  HTN- on Losartan and Doxazosin daily.  Denies any HA, blurred vision, CP or SOB. Does have LE edema which she takes lasix.  BP elevated today but rushed to get here.  Asymptomatic.  BP was normal at last office visit in [facility] (Dr. [name]).  Anxiety- feels it is well controlled on Lexapro 10 mg daily.  Denies any symptoms of anxiety or depression.  Very active- still working 3 days per week at [facility].  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  Patient Active Problem List  Diagnosis  . DM  . DIABETIC PERIPHERAL NEUROPATHY  . HYPERLIPIDEMIA  . OBESITY, MORBID  . INADEQUATE SLEEP HYGIENE (PERSISTENT)  . HYPERTENSION  . VENOUS INSUFFICIENCY, CHRONIC  . PULMONARY NODULE  . DYSPNEA  . AR (allergic rhinitis)  . Overactive bladder  . Unspecified hypothyroidism  . GERD (gastroesophageal reflux disease)   Past Medical History  Diagnosis Date  . Trigger finger   . Hyperlipidemia   . Diabetes mellitus   . Chronic venous insufficiency   . Morbid obesity   . Hypertension   . Thyroid disease   . GERD (gastroesophageal reflux disease)    Past Surgical History  Procedure Laterality Date  . Lumbar laminectomy     History  Substance Use Topics  . Smoking status: Never Smoker   . Smokeless tobacco: Not on file  . Alcohol Use: No    Family History  Problem Relation Age of Onset  . Heart disease Father   . Arthritis Father   . Breast cancer Mother   . Lymphoma Sister     #1  . Diabetes Sister     #1  . Diabetes Sister     #2  . Diabetes Brother     #1  . Diabetes Brother     #2  . Diabetes Other     siblings  . Hypertension Other     siblings  . Arthritis Other     siblings   Allergies  Allergen Reactions  . Oxycodone-Acetaminophen Nausea Only  . Zithromax (Azithromycin)     thrush   Current Outpatient Prescriptions on File Prior to Visit  Medication Sig Dispense Refill  . acetaminophen (TYLENOL) 325 MG tablet Per bottle      . Biotin 5000 MCG CAPS Take 1 capsule by mouth daily.      Marland Kitchen escitalopram (LEXAPRO) 20 MG tablet Take 20 mg by mouth daily.      Marland Kitchen esomeprazole (NEXIUM) 40 MG capsule Take 1 capsule (40 mg total) by mouth daily before breakfast.  90 capsule  3  . famotidine (PEPCID) 20 MG tablet Take 20 mg by mouth at bedtime.      . furosemide (LASIX) 40 MG tablet Take 40 mg by mouth  daily.      . insulin glargine (LANTUS) 100 UNIT/ML injection Inject 60 Units into the skin every morning.      Marland Kitchen levothyroxine (SYNTHROID, LEVOTHROID) 88 MCG tablet Take 88 mcg by mouth daily.      . Liraglutide (VICTOZA) 18 MG/3ML SOLN 1.8mg  subcutaneous every morning      . loratadine (CLARITIN) 10 MG tablet Take 10 mg by mouth daily as needed.       Marland Kitchen losartan (COZAAR) 25 MG tablet Take 25 mg by mouth daily.      . meloxicam (MOBIC) 7.5 MG tablet Take 1 tablet (7.5 mg total) by mouth daily.  15 tablet  0  . metFORMIN (GLUCOPHAGE) 1000 MG tablet Take 1,000 mg by mouth 2 (two) times daily with a meal.      . potassium chloride (KLOR-CON) 20 MEQ packet Take 20 mEq by mouth daily.      . pregabalin (LYRICA) 75 MG capsule 1 cap every morning and 1 at bedtime      . TOVIAZ 4 MG TB24 TAKE 1 TABLET BY MOUTH DAILY AT BEDTIME.  30 tablet  3  . traZODone (DESYREL) 100 MG tablet Take 2 by mouth at bedtime  180 tablet   3  . [DISCONTINUED] fesoterodine (TOVIAZ) 4 MG TB24 Take 1 tablet (4 mg total) by mouth at bedtime.  30 tablet  3   No current facility-administered medications on file prior to visit.   The PMH, PSH, Social History, Family History, Medications, and allergies have been reviewed in [facility], and have been updated if relevant.    Review of Systems See HPI     Objective:   Physical Exam BP 160/62  Pulse 76  Temp(Src) 98.1 F (36.7 C)  Ht 5' 6.5" (1.689 m)  Wt 241 lb (109.317 kg)  BMI 38.32 kg/m2 BP Readings from Last 3 Encounters:  05/28/12 160/62  01/05/12 118/58  11/03/11 135/63    General:  Well-developed,well-nourished,in no acute distress; alert,appropriate and cooperative throughout examination Head:  normocephalic and atraumatic.   Eyes:  vision grossly intact, pupils equal, pupils round, and pupils reactive to light.   Lungs:  Normal respiratory effort, chest expands symmetrically. Lungs are clear to auscultation, no crackles or wheezes. Heart:  Normal rate and regular rhythm. S1 and S2 normal without gallop, murmur, click, rub or other extra sounds. Abdomen:  Bowel sounds positive,abdomen soft and non-tender without masses, organomegaly or hernias noted. Msk:  No deformity or scoliosis noted of thoracic or lumbar spine.   Extremities:  No clubbing. 1+ bilateral pitting edema Neurologic:  alert & oriented X3 and gait normal.   Skin:  Intact without suspicious lesions or rashes Psych:  Cognition and judgment appear intact. Alert and cooperative with normal attention span and concentration. No apparent delusions, illusions, hallucinations    Assessment & Plan:  1. DM Well controlled on current meds.  Awaiting labs and records from Dr. Sharl Ma.  2. HYPERLIPIDEMIA On Simvastatin 20 mg daily.  3. HYPERTENSION Has been controlled on current meds.  She will recheck at work.  4. Unspecified hypothyroidism On synthroid 88 mcg daily.  5. GERD (gastroesophageal reflux  disease) Quiet on nexium.  6. DIABETIC PERIPHERAL NEUROPATHY Lyrica.

## 2012-05-28 NOTE — Patient Instructions (Addendum)
It was great to see you.  Please ask Dr. Sharl Ma to send your lab results to me.

## 2012-06-11 ENCOUNTER — Ambulatory Visit: Payer: 59 | Attending: Orthopedic Surgery

## 2012-06-11 DIAGNOSIS — M25559 Pain in unspecified hip: Secondary | ICD-10-CM | POA: Insufficient documentation

## 2012-06-11 DIAGNOSIS — IMO0001 Reserved for inherently not codable concepts without codable children: Secondary | ICD-10-CM | POA: Insufficient documentation

## 2012-06-14 ENCOUNTER — Ambulatory Visit: Payer: 59

## 2012-06-17 ENCOUNTER — Ambulatory Visit: Payer: 59

## 2012-06-20 ENCOUNTER — Ambulatory Visit: Payer: 59 | Attending: Orthopedic Surgery

## 2012-06-20 DIAGNOSIS — IMO0001 Reserved for inherently not codable concepts without codable children: Secondary | ICD-10-CM | POA: Insufficient documentation

## 2012-06-20 DIAGNOSIS — M25559 Pain in unspecified hip: Secondary | ICD-10-CM | POA: Insufficient documentation

## 2012-06-24 ENCOUNTER — Ambulatory Visit: Payer: 59

## 2012-06-27 ENCOUNTER — Ambulatory Visit: Payer: 59

## 2012-07-02 ENCOUNTER — Ambulatory Visit: Payer: 59

## 2012-07-03 NOTE — Progress Notes (Signed)
Patient presents today for 3 month diabetes follow-up as part of the employer-sponsored [employer] program. Current diabetes regimen includes Metformin, Lantus, and Victoza. Patient also continues on daily ASA, ARB, and statin. Most recent MD follow-up was with Dr. Sharl Ma on 05/31/12. Patient has a pending appt for 6 month follow-up. No med changes or major health changes at this time. A1c was drawn at most recent follow-up and found to be 6.9.   Diabetes Assessment: Type of Diabetes: Type 2; MD managing Diabetes Sharl Ma; checks blood glucose 2 times a day; hypoglycemia frequency infrequent; takes an aspirin a day; takes medications as prescribed; Highest CBG 155; Lowest CBG 70; A1c 6.9 via MD office.  Other Diabetes History: Current med regimen includes Metformin 1000 mg twice daily, Victoza 1.8 mg daily, and Lantus 70 units daily. Patient does maintain good medication compliance. Patient did not bring meter today but is currently testing 1-2 times per day. Glucose monitoring occurs fasting and when symptomatic. Hypoglycemia frequency is occassional. Patient does demonstrate appropriate correction of hypoglycemia. Patient reports continued signs and symptoms of neuropathy including numbness/tingling/burning. She is currently being treated with Lyrica 75 mg twice daily, but reports this is not adequate. In the past, she has been on 75 mg AM and 150 mg PM and reports improved symptom control. I will fax MD to determine if he is willing to resume previous dose. Patient is not due for yearly eye exam.  Lifestyle Factors: Diet - Patient maintains a fairly healthy diet and has worked to make dinner more healthy. She still finds it a challenge to eat an early supper and often eats as late as 8 pm. She is working to improve this. No dietary goals at this time other than to maintain current changes.   Exercise - No routine exercise at this time. Patient continues to be limited by neuropathic pain in lower legs,  bursitis of hip, and arthritis of knee. 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 On a positive note, patient is attending physical therapy twice weekly for bursitis in right hip. Will also soon start a series of ","rooster comb"," injections for knee arthritis.   Assessment: Patient is doing well today with at-goal A1c of 6.9. She has attempted to maintain changes to diet and exercise and will work on these over the course of the next few months. Patient continues under a great deal of stress while being caretaker of various family members, and this does impact her dietary habits. She also continues struggling with a number of neuropathic and arthritis issues that inhibit exercise. I am hopeful this patient will continue to work toward better care of her DM and I will follow-up in 3 months.   Plan: 1) Attempt to maintain healthy dietary habits and attempt to eat regular meals on weekends 2) Continue to stay as active as possible 3) Continue testing regularly 4) I will fax Dr. Sharl Ma for increase Lyrica dose 5) Follow-up in 3 months on Wednesday August 13th @ 1:30 pm

## 2012-07-04 ENCOUNTER — Ambulatory Visit (INDEPENDENT_AMBULATORY_CARE_PROVIDER_SITE_OTHER): Payer: Self-pay | Admitting: Family Medicine

## 2012-07-04 ENCOUNTER — Ambulatory Visit: Payer: 59

## 2012-07-04 VITALS — BP 128/68 | Wt 236.0 lb

## 2012-07-04 DIAGNOSIS — E119 Type 2 diabetes mellitus without complications: Secondary | ICD-10-CM

## 2012-07-08 ENCOUNTER — Ambulatory Visit: Payer: 59

## 2012-07-09 ENCOUNTER — Other Ambulatory Visit: Payer: Self-pay | Admitting: Family Medicine

## 2012-07-09 MED ORDER — ESCITALOPRAM OXALATE 20 MG PO TABS: 20.0000 mg | ORAL_TABLET | Freq: Every day | ORAL | Status: DC

## 2012-07-11 ENCOUNTER — Ambulatory Visit: Payer: 59

## 2012-07-16 ENCOUNTER — Ambulatory Visit: Payer: 59

## 2012-07-18 ENCOUNTER — Ambulatory Visit: Payer: 59

## 2012-07-18 NOTE — Progress Notes (Signed)
Patient ID: Theresa Summers, female   DOB: 11/18/1940, 72 y.o.   MRN: [id number] ATTENDING PHYSICIAN NOTE: I have reviewed the chart and agree with the plan as detailed above. Vernia Teem MD Pager [phone]  

## 2012-07-19 ENCOUNTER — Ambulatory Visit: Payer: Self-pay | Admitting: Nurse Practitioner

## 2012-07-23 ENCOUNTER — Ambulatory Visit: Payer: 59 | Attending: Orthopedic Surgery

## 2012-07-23 DIAGNOSIS — IMO0001 Reserved for inherently not codable concepts without codable children: Secondary | ICD-10-CM | POA: Insufficient documentation

## 2012-07-23 DIAGNOSIS — M25559 Pain in unspecified hip: Secondary | ICD-10-CM | POA: Insufficient documentation

## 2012-07-25 ENCOUNTER — Ambulatory Visit: Payer: 59 | Admitting: Physical Therapy

## 2012-07-30 ENCOUNTER — Ambulatory Visit: Payer: 59

## 2012-08-01 ENCOUNTER — Ambulatory Visit: Payer: 59

## 2012-08-06 ENCOUNTER — Ambulatory Visit: Payer: 59

## 2012-08-08 ENCOUNTER — Ambulatory Visit: Payer: 59

## 2012-08-19 ENCOUNTER — Other Ambulatory Visit: Payer: Self-pay | Admitting: *Deleted

## 2012-08-19 MED ORDER — FESOTERODINE FUMARATE ER 4 MG PO TB24: ORAL_TABLET | ORAL | Status: DC

## 2012-08-19 NOTE — Telephone Encounter (Signed)
Received a faxed refill request from pharmacy. Last office visit 06/07/12. Patient has not had normal UA within 12 months per protocol. Is it okay to refill?

## 2012-09-27 ENCOUNTER — Telehealth: Payer: Self-pay | Admitting: Family Medicine

## 2012-09-27 NOTE — Telephone Encounter (Signed)
Theresa Summers dropped off a rx form to be filled out.

## 2012-09-30 NOTE — Telephone Encounter (Signed)
This is a form for patient assistance for toviaz.  Form is on your desk.

## 2012-10-01 ENCOUNTER — Emergency Department (HOSPITAL_COMMUNITY): Payer: 59

## 2012-10-01 ENCOUNTER — Inpatient Hospital Stay (HOSPITAL_COMMUNITY)
Admission: EM | Admit: 2012-10-01 | Discharge: 2012-10-07 | DRG: 442 | Disposition: A | Discharge status: Home / Self Care | Payer: 59 | Attending: Internal Medicine | Admitting: Internal Medicine

## 2012-10-01 ENCOUNTER — Encounter (HOSPITAL_COMMUNITY): Payer: Self-pay | Admitting: Emergency Medicine

## 2012-10-01 ENCOUNTER — Telehealth: Payer: Self-pay | Admitting: Family Medicine

## 2012-10-01 DIAGNOSIS — R932 Abnormal findings on diagnostic imaging of liver and biliary tract: Secondary | ICD-10-CM

## 2012-10-01 DIAGNOSIS — E039 Hypothyroidism, unspecified: Secondary | ICD-10-CM | POA: Diagnosis present

## 2012-10-01 DIAGNOSIS — K869 Disease of pancreas, unspecified: Secondary | ICD-10-CM | POA: Diagnosis present

## 2012-10-01 DIAGNOSIS — E1149 Type 2 diabetes mellitus with other diabetic neurological complication: Secondary | ICD-10-CM

## 2012-10-01 DIAGNOSIS — R109 Unspecified abdominal pain: Secondary | ICD-10-CM | POA: Diagnosis present

## 2012-10-01 DIAGNOSIS — K8020 Calculus of gallbladder without cholecystitis without obstruction: Secondary | ICD-10-CM | POA: Diagnosis present

## 2012-10-01 DIAGNOSIS — K760 Fatty (change of) liver, not elsewhere classified: Secondary | ICD-10-CM

## 2012-10-01 DIAGNOSIS — G4700 Insomnia, unspecified: Secondary | ICD-10-CM

## 2012-10-01 DIAGNOSIS — C259 Malignant neoplasm of pancreas, unspecified: Secondary | ICD-10-CM

## 2012-10-01 DIAGNOSIS — I872 Venous insufficiency (chronic) (peripheral): Secondary | ICD-10-CM | POA: Diagnosis present

## 2012-10-01 DIAGNOSIS — Z6837 Body mass index (BMI) 37.0-37.9, adult: Secondary | ICD-10-CM

## 2012-10-01 DIAGNOSIS — J984 Other disorders of lung: Secondary | ICD-10-CM

## 2012-10-01 DIAGNOSIS — Z79899 Other long term (current) drug therapy: Secondary | ICD-10-CM

## 2012-10-01 DIAGNOSIS — E785 Hyperlipidemia, unspecified: Secondary | ICD-10-CM | POA: Diagnosis present

## 2012-10-01 DIAGNOSIS — K7689 Other specified diseases of liver: Secondary | ICD-10-CM | POA: Diagnosis present

## 2012-10-01 DIAGNOSIS — N3281 Overactive bladder: Secondary | ICD-10-CM

## 2012-10-01 DIAGNOSIS — K219 Gastro-esophageal reflux disease without esophagitis: Secondary | ICD-10-CM | POA: Diagnosis present

## 2012-10-01 DIAGNOSIS — J309 Allergic rhinitis, unspecified: Secondary | ICD-10-CM

## 2012-10-01 DIAGNOSIS — D509 Iron deficiency anemia, unspecified: Secondary | ICD-10-CM

## 2012-10-01 DIAGNOSIS — J90 Pleural effusion, not elsewhere classified: Secondary | ICD-10-CM | POA: Diagnosis present

## 2012-10-01 DIAGNOSIS — R0989 Other specified symptoms and signs involving the circulatory and respiratory systems: Secondary | ICD-10-CM

## 2012-10-01 DIAGNOSIS — K8689 Other specified diseases of pancreas: Secondary | ICD-10-CM

## 2012-10-01 DIAGNOSIS — E119 Type 2 diabetes mellitus without complications: Secondary | ICD-10-CM | POA: Diagnosis present

## 2012-10-01 DIAGNOSIS — I10 Essential (primary) hypertension: Secondary | ICD-10-CM | POA: Diagnosis present

## 2012-10-01 DIAGNOSIS — N1330 Unspecified hydronephrosis: Secondary | ICD-10-CM | POA: Diagnosis present

## 2012-10-01 DIAGNOSIS — K838 Other specified diseases of biliary tract: Secondary | ICD-10-CM

## 2012-10-01 DIAGNOSIS — I81 Portal vein thrombosis: Principal | ICD-10-CM | POA: Diagnosis present

## 2012-10-01 DIAGNOSIS — Z87442 Personal history of urinary calculi: Secondary | ICD-10-CM

## 2012-10-01 DIAGNOSIS — F40298 Other specified phobia: Secondary | ICD-10-CM | POA: Diagnosis present

## 2012-10-01 DIAGNOSIS — Z794 Long term (current) use of insulin: Secondary | ICD-10-CM

## 2012-10-01 DIAGNOSIS — G609 Hereditary and idiopathic neuropathy, unspecified: Secondary | ICD-10-CM | POA: Diagnosis present

## 2012-10-01 HISTORY — DX: Other specified diseases of biliary tract: K83.8

## 2012-10-01 HISTORY — DX: Unspecified osteoarthritis, unspecified site: M19.90

## 2012-10-01 HISTORY — DX: Disease of pancreas, unspecified: K86.9

## 2012-10-01 HISTORY — DX: Anemia, unspecified: D64.9

## 2012-10-01 HISTORY — DX: Fatty (change of) liver, not elsewhere classified: K76.0

## 2012-10-01 HISTORY — DX: Hypothyroidism, unspecified: E03.9

## 2012-10-01 LAB — CBC WITH DIFFERENTIAL/PLATELET
Eosinophils Relative: 2 % (ref 0–5)
HCT: 32.6 % — ABNORMAL LOW (ref 36.0–46.0)
Hemoglobin: 10.7 g/dL — ABNORMAL LOW (ref 12.0–15.0)
Lymphocytes Relative: 17 % (ref 12–46)
MCHC: 32.8 g/dL (ref 30.0–36.0)
MCV: 84.9 fL (ref 78.0–100.0)
Monocytes Absolute: 0.7 10*3/uL (ref 0.1–1.0)
Monocytes Relative: 10 % (ref 3–12)
Neutro Abs: 4.6 10*3/uL (ref 1.7–7.7)
WBC: 6.5 10*3/uL (ref 4.0–10.5)

## 2012-10-01 LAB — COMPREHENSIVE METABOLIC PANEL
BUN: 9 mg/dL (ref 6–23)
CO2: 29 mEq/L (ref 19–32)
Chloride: 102 mEq/L (ref 96–112)
Creatinine, Ser: 0.71 mg/dL (ref 0.50–1.10)
GFR calc Af Amer: >90 mL/min (ref >90)
GFR calc non Af Amer: 84 mL/min — ABNORMAL LOW (ref >90)
Glucose, Bld: 169 mg/dL — ABNORMAL HIGH (ref 70–99)
Total Bilirubin: 0.2 mg/dL — ABNORMAL LOW (ref 0.3–1.2)

## 2012-10-01 LAB — URINALYSIS, ROUTINE W REFLEX MICROSCOPIC
Hgb urine dipstick: NEGATIVE
Leukocytes, UA: NEGATIVE
Nitrite: NEGATIVE
Protein, ur: NEGATIVE mg/dL
Urobilinogen, UA: 1 mg/dL (ref 0.0–1.0)

## 2012-10-01 LAB — POCT I-STAT TROPONIN I: Troponin i, poc: 0.01 ng/mL (ref 0.00–0.08)

## 2012-10-01 LAB — CG4 I-STAT (LACTIC ACID): Lactic Acid, Venous: 0.61 mmol/L (ref 0.5–2.2)

## 2012-10-01 MED ORDER — IOHEXOL 300 MG/ML  SOLN
100.0000 mL | Freq: Once | INTRAMUSCULAR | Status: AC | PRN
  Administered 2012-10-01: 100 mL via INTRAVENOUS

## 2012-10-01 MED ORDER — ONDANSETRON HCL 4 MG/2ML IJ SOLN
4.0000 mg | Freq: Four times a day (QID) | INTRAMUSCULAR | Status: DC | PRN
  Administered 2012-10-01 (×2): 4 mg via INTRAVENOUS
  Filled 2012-10-01 (×2): qty 2

## 2012-10-01 MED ORDER — MORPHINE SULFATE 2 MG/ML IJ SOLN
2.0000 mg | Freq: Once | INTRAMUSCULAR | Status: AC
  Administered 2012-10-01: 2 mg via INTRAVENOUS
  Filled 2012-10-01: qty 1

## 2012-10-01 MED ORDER — IOHEXOL 300 MG/ML  SOLN
25.0000 mL | INTRAMUSCULAR | Status: AC
  Administered 2012-10-01: 25 mL via ORAL

## 2012-10-01 NOTE — ED Provider Notes (Signed)
CSN: [id number]     Arrival date & time 10/01/12  1626 History     First MD Initiated Contact with Patient 10/01/12 1902     Chief Complaint  Patient presents with  . Abdominal Pain  . Fatigue   (Consider location/radiation/quality/duration/timing/severity/associated sxs/prior Treatment) HPI Theresa Summers is a 72 y.o. female who presents to ED with complaint of abdominal pain. States pain for about 2 weeks. States it is constant, but worsened with eating. States nausea with food. Vomited x1 today. States took tramadol at home with no relief. Reports abdomen feels "swollen." No prior surgeries. No urinary symptoms. No diarrhea. Last bowel movement earlier today and is normal. No fever, chills. No chest pain. Pain mainly epigastric radiating into right flank.  Past Medical History  Diagnosis Date  . Trigger finger   . Hyperlipidemia   . Diabetes mellitus   . Chronic venous insufficiency   . Morbid obesity   . Hypertension   . Thyroid disease   . GERD (gastroesophageal reflux disease)    Past Surgical History  Procedure Laterality Date  . Lumbar laminectomy     Family History  Problem Relation Age of Onset  . Heart disease Father   . Arthritis Father   . Breast cancer Mother   . Lymphoma Sister     #1  . Diabetes Sister     #1  . Diabetes Sister     #2  . Diabetes Brother     #1  . Diabetes Brother     #2  . Diabetes Other     siblings  . Hypertension Other     siblings  . Arthritis Other     siblings   History  Substance Use Topics  . Smoking status: Never Smoker   . Smokeless tobacco: Not on file  . Alcohol Use: No   OB History   Grav Para Term Preterm Abortions TAB SAB Ect Mult Living                 Review of Systems  Constitutional: Negative for fever and chills.  HENT: Negative for neck pain and neck stiffness.   Respiratory: Negative.   Cardiovascular: Negative.   Gastrointestinal: Positive for nausea, vomiting and abdominal pain. Negative for  diarrhea and constipation.  Genitourinary: Positive for flank pain. Negative for dysuria, urgency and pelvic pain.  Neurological: Positive for dizziness and weakness.  All other systems reviewed and are negative.    Allergies  Oxycodone-acetaminophen and Zithromax  Home Medications   Current Outpatient Rx  Name  Route  Sig  Dispense  Refill  . acetaminophen (TYLENOL) 325 MG tablet   Oral   Take 325 mg by mouth every 4 (four) hours as needed for pain. Per bottle         . Biotin 5000 MCG CAPS   Oral   Take 5,000 mcg by mouth daily.          Marland Kitchen doxazosin (CARDURA) 8 MG tablet   Oral   Take 8 mg by mouth daily.         Marland Kitchen escitalopram (LEXAPRO) 20 MG tablet   Oral   Take 1 tablet (20 mg total) by mouth daily.   30 tablet   6   . famotidine (PEPCID) 20 MG tablet   Oral   Take 20 mg by mouth daily as needed for heartburn.          . fesoterodine (TOVIAZ) 4 MG TB24 tablet   Oral  Take 4 mg by mouth at bedtime.         . furosemide (LASIX) 40 MG tablet   Oral   Take 40 mg by mouth daily.         . insulin glargine (LANTUS) 100 UNIT/ML injection   Subcutaneous   Inject 70 Units into the skin every morning.          Marland Kitchen levothyroxine (SYNTHROID, LEVOTHROID) 88 MCG tablet   Oral   Take 88 mcg by mouth daily.         . Liraglutide (VICTOZA) 18 MG/3ML SOLN   Subcutaneous   Inject 1.8 mg into the skin daily. 1.8mg  subcutaneous every morning         . loratadine (CLARITIN) 10 MG tablet   Oral   Take 10 mg by mouth daily as needed for allergies.          Marland Kitchen losartan (COZAAR) 25 MG tablet   Oral   Take 25 mg by mouth daily.         . metFORMIN (GLUCOPHAGE) 1000 MG tablet   Oral   Take 1,000 mg by mouth 2 (two) times daily with a meal.         . pantoprazole (PROTONIX) 40 MG tablet   Oral   Take 40 mg by mouth daily.         . potassium chloride (KLOR-CON) 20 MEQ packet   Oral   Take 20 mEq by mouth daily.         . simvastatin  (ZOCOR) 20 MG tablet   Oral   Take 1 tablet (20 mg total) by mouth at bedtime.   90 tablet   3   . traZODone (DESYREL) 100 MG tablet   Oral   Take 100 mg by mouth at bedtime. Take 2 by mouth at bedtime         . pregabalin (LYRICA) 75 MG capsule      1 cap every morning and 1 at bedtime          BP 169/67  Pulse 85  Temp(Src) 98.5 F (36.9 C) (Oral)  Resp 16  SpO2 96% Physical Exam  Nursing note and vitals reviewed. Constitutional: She is oriented to person, place, and time. She appears well-developed and well-nourished. No distress.  Eyes: Conjunctivae are normal.  Neck: Neck supple.  Cardiovascular: Normal rate, regular rhythm and normal heart sounds.   Pulmonary/Chest: Effort normal and breath sounds normal. No respiratory distress. She has no wheezes. She has no rales.  Abdominal: Soft. Bowel sounds are normal. She exhibits no distension. There is tenderness. There is no rebound and no guarding.  LUQ, RUQ. No CVA tenderness  Musculoskeletal: She exhibits no edema.  Neurological: She is alert and oriented to person, place, and time.  Skin: Skin is warm and dry. No rash noted. No erythema.    ED Course   Procedures (including critical care time) Results for orders placed during the hospital encounter of 10/01/12  CBC WITH DIFFERENTIAL      Result Value Range   WBC 6.5  4.0 - 10.5 K/uL   RBC 3.84 (*) 3.87 - 5.11 MIL/uL   Hemoglobin 10.7 (*) 12.0 - 15.0 g/dL   HCT 40.9 (*) 81.1 - 91.4 %   MCV 84.9  78.0 - 100.0 fL   MCH 27.9  26.0 - 34.0 pg   MCHC 32.8  30.0 - 36.0 g/dL   RDW 78.2  95.6 - 21.3 %   Platelets  220  150 - 400 K/uL   Neutrophils Relative % 71  43 - 77 %   Neutro Abs 4.6  1.7 - 7.7 K/uL   Lymphocytes Relative 17  12 - 46 %   Lymphs Abs 1.1  0.7 - 4.0 K/uL   Monocytes Relative 10  3 - 12 %   Monocytes Absolute 0.7  0.1 - 1.0 K/uL   Eosinophils Relative 2  0 - 5 %   Eosinophils Absolute 0.1  0.0 - 0.7 K/uL   Basophils Relative 1  0 - 1 %    Basophils Absolute 0.0  0.0 - 0.1 K/uL  COMPREHENSIVE METABOLIC PANEL      Result Value Range   Sodium 141  135 - 145 mEq/L   Potassium 3.7  3.5 - 5.1 mEq/L   Chloride 102  96 - 112 mEq/L   CO2 29  19 - 32 mEq/L   Glucose, Bld 169 (*) 70 - 99 mg/dL   BUN 9  6 - 23 mg/dL   Creatinine, Ser 9.81  0.50 - 1.10 mg/dL   Calcium 9.6  8.4 - 19.1 mg/dL   Total Protein 7.1  6.0 - 8.3 g/dL   Albumin 3.6  3.5 - 5.2 g/dL   AST 21  0 - 37 U/L   ALT 14  0 - 35 U/L   Alkaline Phosphatase 56  39 - 117 U/L   Total Bilirubin 0.2 (*) 0.3 - 1.2 mg/dL   GFR calc non Af Amer 84 (*) >90 mL/min   GFR calc Af Amer >90  >90 mL/min  URINALYSIS, ROUTINE W REFLEX MICROSCOPIC      Result Value Range   Color, Urine YELLOW  YELLOW   APPearance CLEAR  CLEAR   Specific Gravity, Urine 1.010  1.005 - 1.030   pH 6.5  5.0 - 8.0   Glucose, UA NEGATIVE  NEGATIVE mg/dL   Hgb urine dipstick NEGATIVE  NEGATIVE   Bilirubin Urine NEGATIVE  NEGATIVE   Ketones, ur NEGATIVE  NEGATIVE mg/dL   Protein, ur NEGATIVE  NEGATIVE mg/dL   Urobilinogen, UA 1.0  0.0 - 1.0 mg/dL   Nitrite NEGATIVE  NEGATIVE   Leukocytes, UA NEGATIVE  NEGATIVE  LIPASE, BLOOD      Result Value Range   Lipase 56  11 - 59 U/L  APTT      Result Value Range   aPTT 35  24 - 37 seconds  GLUCOSE, CAPILLARY      Result Value Range   Glucose-Capillary 72  70 - 99 mg/dL  LIPASE, BLOOD      Result Value Range   Lipase 33  11 - 59 U/L  LACTIC ACID, PLASMA      Result Value Range   Lactic Acid, Venous 0.5  0.5 - 2.2 mmol/L  COMPREHENSIVE METABOLIC PANEL      Result Value Range   Sodium 142  135 - 145 mEq/L   Potassium 3.3 (*) 3.5 - 5.1 mEq/L   Chloride 105  96 - 112 mEq/L   CO2 29  19 - 32 mEq/L   Glucose, Bld 80  70 - 99 mg/dL   BUN 7  6 - 23 mg/dL   Creatinine, Ser 4.78  0.50 - 1.10 mg/dL   Calcium 8.9  8.4 - 29.5 mg/dL   Total Protein 5.9 (*) 6.0 - 8.3 g/dL   Albumin 3.0 (*) 3.5 - 5.2 g/dL   AST 19  0 - 37 U/L   ALT 11  0 - 35 U/L   Alkaline  Phosphatase 44  39 - 117 U/L   Total Bilirubin 0.3  0.3 - 1.2 mg/dL   GFR calc non Af Amer 83 (*) >90 mL/min   GFR calc Af Amer >90  >90 mL/min  CBC WITH DIFFERENTIAL      Result Value Range   WBC 6.4  4.0 - 10.5 K/uL   RBC 3.38 (*) 3.87 - 5.11 MIL/uL   Hemoglobin 9.2 (*) 12.0 - 15.0 g/dL   HCT 16.1 (*) 09.6 - 04.5 %   MCV 84.6  78.0 - 100.0 fL   MCH 27.2  26.0 - 34.0 pg   MCHC 32.2  30.0 - 36.0 g/dL   RDW 40.9  81.1 - 91.4 %   Platelets 209  150 - 400 K/uL   Neutrophils Relative % 55  43 - 77 %   Neutro Abs 3.5  1.7 - 7.7 K/uL   Lymphocytes Relative 27  12 - 46 %   Lymphs Abs 1.7  0.7 - 4.0 K/uL   Monocytes Relative 14 (*) 3 - 12 %   Monocytes Absolute 0.9  0.1 - 1.0 K/uL   Eosinophils Relative 3  0 - 5 %   Eosinophils Absolute 0.2  0.0 - 0.7 K/uL   Basophils Relative 1  0 - 1 %   Basophils Absolute 0.1  0.0 - 0.1 K/uL  HEPARIN LEVEL (UNFRACTIONATED)      Result Value Range   Heparin Unfractionated 0.16 (*) 0.30 - 0.70 IU/mL  GLUCOSE, CAPILLARY      Result Value Range   Glucose-Capillary 74  70 - 99 mg/dL   Comment 1 Notify RN    CANCER ANTIGEN 19-9      Result Value Range   CA 19-9 527.5 (*) <35.0 U/mL  GLUCOSE, CAPILLARY      Result Value Range   Glucose-Capillary 83  70 - 99 mg/dL   Comment 1 Notify RN    GLUCOSE, CAPILLARY      Result Value Range   Glucose-Capillary 141 (*) 70 - 99 mg/dL  HEPARIN LEVEL (UNFRACTIONATED)      Result Value Range   Heparin Unfractionated <0.10 (*) 0.30 - 0.70 IU/mL  CBC      Result Value Range   WBC 5.8  4.0 - 10.5 K/uL   RBC 3.49 (*) 3.87 - 5.11 MIL/uL   Hemoglobin 9.5 (*) 12.0 - 15.0 g/dL   HCT 78.2 (*) 95.6 - 21.3 %   MCV 84.8  78.0 - 100.0 fL   MCH 27.2  26.0 - 34.0 pg   MCHC 32.1  30.0 - 36.0 g/dL   RDW 08.6  57.8 - 46.9 %   Platelets 202  150 - 400 K/uL  BASIC METABOLIC PANEL      Result Value Range   Sodium 142  135 - 145 mEq/L   Potassium 3.8  3.5 - 5.1 mEq/L   Chloride 108  96 - 112 mEq/L   CO2 26  19 - 32 mEq/L    Glucose, Bld 122 (*) 70 - 99 mg/dL   BUN 6  6 - 23 mg/dL   Creatinine, Ser 6.29  0.50 - 1.10 mg/dL   Calcium 9.0  8.4 - 52.8 mg/dL   GFR calc non Af Amer 88 (*) >90 mL/min   GFR calc Af Amer >90  >90 mL/min  VITAMIN B12      Result Value Range   Vitamin B-12 472  211 - 911 pg/mL  FOLATE      Result Value Range   Folate 15.6    IRON AND TIBC      Result Value Range   Iron 14 (*) 42 - 135 ug/dL   TIBC 960 (*) 454 - 098 ug/dL   Saturation Ratios 6 (*) 20 - 55 %   UIBC 213  125 - 400 ug/dL  FERRITIN      Result Value Range   Ferritin 65  10 - 291 ng/mL  RETICULOCYTES      Result Value Range   Retic Ct Pct 0.8  0.4 - 3.1 %   RBC. 3.31 (*) 3.87 - 5.11 MIL/uL   Retic Count, Manual 26.5  19.0 - 186.0 K/uL  GLUCOSE, CAPILLARY      Result Value Range   Glucose-Capillary 157 (*) 70 - 99 mg/dL  GLUCOSE, CAPILLARY      Result Value Range   Glucose-Capillary 105 (*) 70 - 99 mg/dL  GLUCOSE, CAPILLARY      Result Value Range   Glucose-Capillary 113 (*) 70 - 99 mg/dL  GLUCOSE, CAPILLARY      Result Value Range   Glucose-Capillary 122 (*) 70 - 99 mg/dL   Comment 1 Documented in Chart     Comment 2 Notify RN    GLUCOSE, CAPILLARY      Result Value Range   Glucose-Capillary 102 (*) 70 - 99 mg/dL   Comment 1 Documented in Chart     Comment 2 Notify RN    OCCULT BLOOD X 1 CARD TO LAB, STOOL      Result Value Range   Fecal Occult Bld NEGATIVE  NEGATIVE  GLUCOSE, CAPILLARY      Result Value Range   Glucose-Capillary 143 (*) 70 - 99 mg/dL   Comment 1 Documented in Chart     Comment 2 Notify RN    GLUCOSE, CAPILLARY      Result Value Range   Glucose-Capillary 148 (*) 70 - 99 mg/dL  PROTIME-INR      Result Value Range   Prothrombin Time 13.6  11.6 - 15.2 seconds   INR 1.06  0.00 - 1.49  HEPARIN LEVEL (UNFRACTIONATED)      Result Value Range   Heparin Unfractionated <0.10 (*) 0.30 - 0.70 IU/mL  POCT I-STAT TROPONIN I      Result Value Range   Troponin i, poc 0.01  0.00 - 0.08  ng/mL   Comment 3           CG4 I-STAT (LACTIC ACID)      Result Value Range   Lactic Acid, Venous 0.61  0.5 - 2.2 mmol/L   Dg Chest 2 View  10/01/2012   *RADIOLOGY REPORT*  Clinical Data: Epigastric pain  CHEST - 2 VIEW  Comparison: Radiograph 11/21 1011  Findings: Normal cardiac silhouette.  There is mild scarring in the lingula unchanged from prior.  No effusion, infiltrate, or pneumothorax. Degenerative osteophytosis of the thoracic spine.  IMPRESSION: No acute cardiopulmonary process.   Original Report Authenticated By: Genevive Bi, M.D.     US Abdomen Complete  10/01/2012   *RADIOLOGY REPORT*  Clinical Data:  Abdominal pain, fatigue and right flank pain  COMPLETE ABDOMINAL ULTRASOUND  Comparison:  None.  Findings:  Gallbladder:  There are multiple dependent gallstones in a distended gallbladder.  There is no wall thickening or pericholecystic fluid.  There is no evidence of acute cholecystitis.  Common bile duct:  The common bile duct is mildly dilated measuring just over  8 mm.  No duct stone is seen.  The distal duct is not well visualized.  Liver:  The liver is echogenic consistent with diffuse fatty infiltration.  It is also somewhat heterogeneous in echogenicity, but no discrete mass or focal lesion is seen.  IVC:  Appears normal.  Pancreas:  Limited visualization.  Portions of the distal tail and inferior head were obscured by bowel gas.  The portions seen are echogenic but otherwise unremarkable.  Spleen:  Normal in size measuring 8.9 cm.  Right Kidney:  Moderate right hydronephrosis with dilated of the proximal right ureter.  No right renal mass or stone. Right kidney measures 13.5 cm in length.  Left Kidney:  No hydronephrosis.  Cyst arises from the mid pole measuring 3 cm.  No other renal masses.  Left kidney measures 12.1 cm in length.  Abdominal aorta:  Aorta not well seen due to overlying bowel gas.  Bladder was imaged to evaluate for ureteral jets.  Ureteral jets were visualized.   IMPRESSION: Multiple gallstones but no evidence of acute cholecystitis.  Mild dilation of the common bile duct.  This most likely is chronic.  Consider a distal duct stone if there are symptoms consistent with this.  The distal duct was not visualized on the study.  Moderate right hydronephrosis.  The etiology of this is unclear. This would be better evaluated with abdominal pelvic CT.  Fatty infiltration of the liver.   Original Report Authenticated By: Amie Portland, M.D.   Ct Abdomen Pelvis W Contrast  10/01/2012   *RADIOLOGY REPORT*  Clinical Data: Abdominal pain, right flank pain  CT ABDOMEN AND PELVIS WITH CONTRAST  Technique:  Multidetector CT imaging of the abdomen and pelvis was performed following the standard protocol during bolus administration of intravenous contrast.  Contrast: OMNIPAQUE IOHEXOL 300 MG/ML  SOLN  Comparison: Abdominal ultrasound 10/01/2012, report of prior CT 11/17/1997 images not digitized, lumbar spine radiographs 11/03/2011.  Findings: Motion degrades imaging at the lung bases.  4 mm pleural parenchymal right lower lobe nodule image 3 is noted.  Trace pleural effusions.  Curvilinear bilateral lower lobe presumed atelectasis or scarring.  Trace perihepatic fluid is identified tracking along the pericolic gutter to the pelvis.  Gallstones are re-identified.  There is portal venous thrombosis with extension to the superior mesenteric vein and surrounding perivenous stranding.  For example, see image 40. Celiac axis, superior mesenteric artery, and IMA are patent allowing for technique.  The splenic vein is patent up to the portosplenic confluence.  There is moderate right hydroureteronephrosis with ureteral decompression as it crosses over the iliac vessels, for example image 74.  No extrinsic or intrinsic mass is identified allowing for technique.  No radiopaque renal, ureteral, or bladder calculus. Renal enhancement is normal with excretion into the collecting systems.  No  perinephric stranding or fluid.  There is an ill-defined 1.2 cm hypodense lesion within the head of the pancreas image 41.  No specific peripancreatic stranding separate from that seen adjacent to the portal vein.  5mm similar hypodense lesion body of pancreas. No pancreatic dilatation.  Spleen and adrenal glands are normal.  Too small to characterize upper renal pole cortical hypodensities noted with left mid renal cortical 2.9 cm cyst.  Uterus demonstrate calcifications which could be related to involuted fibroid or vascular calcifications. The ovaries are normal.  No lymphadenopathy or free air.  No bowel wall thickening or focal segmental dilatation.  No acute osseous finding. Degenerative changes are noted in the spine.  Lower thoracic mild compression deformities are stable.  IMPRESSION: Addition of multiple complex findings as above.  The most acute appearing finding is the portal venous thrombosis with surrounding stranding and extension to the superior mesenteric vein, without evidence for bowel compromise. Occult bowel ischemia or enteritis may not be apparent on CT.  Right moderate hydroureteronephrosis is noted without other complicating features such as calculus; there was mild hydronephrosis described on the remote prior exam and this could represent stricture or other longstanding finding but an intrinsic mass or nonradiopaque calculus could have this appearance.  Hypodense pancreatic lesions which are incompletely characterized. The patient reportedly has no evidence for pancreatitis, for which there could also be association with portal venous thrombosis.  Gallstones without other evidence for acute cholecystitis.  Dedicated non emergent pancreatic and renal imaging are recommended, with modalities tailored to the patient's current course of treatment and clinical condition.  These results were called by telephone on 10/01/2012 at 11:20 p.m. to Dr. Trellis Moment, who verbally acknowledged these  results.   Original Report Authenticated By: Christiana Pellant, M.D.     Dg Chest 2 View  10/01/2012   *RADIOLOGY REPORT*  Clinical Data: Epigastric pain  CHEST - 2 VIEW  Comparison: Radiograph 11/21 1011  Findings: Normal cardiac silhouette.  There is mild scarring in the lingula unchanged from prior.  No effusion, infiltrate, or pneumothorax. Degenerative osteophytosis of the thoracic spine.  IMPRESSION: No acute cardiopulmonary process.   Original Report Authenticated By: Genevive Bi, M.D.   1. Portal vein thrombosis   2. Abdominal pain   3. Hydronephrosis   4. Type II or unspecified type diabetes mellitus without mention of complication, not stated as uncontrolled   5. Iron deficiency anemia, unspecified   6. Nonspecific (abnormal) findings on radiological and other examination of biliary tract   7. Pancreatic mass     MDM  Pt with abdominal pain for about 2 weeks. Findings on labs and CT abdomen/us as described above. I will need further evaluation for all the findigns. Given her age, persistent pain, inability to eat, will need inpatient admission. Discussed with [facility]ad. Asked for GI consult. Pt at this time has normal VS, other than htn. No signs of sepsis.   Spoke with Dr. Bosie Clos, no medications or further tests to be started at this time. Will consult in AM.   Filed Vitals:   10/04/12 0522  BP: 147/64  Pulse: 77  Temp: 99.5 F (37.5 C)  Resp: 18     Durene Dodge A [place], PA-C 10/04/12 [phone]

## 2012-10-01 NOTE — ED Notes (Signed)
CT notified that pt finished with contrast. 

## 2012-10-01 NOTE — ED Notes (Addendum)
Pt transported to US

## 2012-10-01 NOTE — Telephone Encounter (Signed)
Is at [facility] - they should be able to pull up meds at system. Can we call tomorrow for an update?

## 2012-10-01 NOTE — ED Notes (Signed)
Onset 10 days ago general abdominal pain took tramadol at home with some relief.  States nausea and emesis today without diarrhea or constipation. Within the past week developed right flank pain denies any urinary complaints.

## 2012-10-01 NOTE — Telephone Encounter (Signed)
Patient Information:  Caller Name: Lillia Abed  Phone: [phone]  Patient: Theresa Summers  Gender: Female  DOB: 30-May-1940  Age: 72 Years  PCP: Rut[facility])  Office Follow Up:  Does the office need to follow up with this patient?: Yes  Instructions For The Office: Grandaughter/Lindsay on cell [phone], as needed.  Is with patient  RN Note:  Will take her to [facility] ER now. Wanting list of medications faxed to office where she is [phone], educated [facility] may be able to pull up EPIC and verify meds.  Symptoms  Reason For Call & Symptoms: Has had intermittent abdominal pain for 2 weeks.  Pain gradually getting worse during the last week, severe the last couple of days.  Pain equally in upper and lower abdomen.  Abdomen distended today 8/12, some back pain with radiation around Right flank.  Started vomiting 30 minutes ago, has vomited 2-3 times.  Has only been eating soup ofr the last week but still severe abdominal pain.  Reviewed Health History In EMR: Yes  Reviewed Medications In EMR: Yes  Reviewed Allergies In EMR: Yes  Reviewed Surgeries / Procedures: Yes  Date of Onset of Symptoms: 09/17/2012  Treatments Tried: only eating soup  Treatments Tried Worked: No  Guideline(s) Used:  Abdominal Pain - Female  Disposition Per Guideline:   Go to ED Now  Reason For Disposition Reached:   Severe abdominal pain (e.g., excruciating)  Advice Given:  N/A  Patient Will Follow Care Advice:  YES

## 2012-10-02 ENCOUNTER — Encounter (HOSPITAL_COMMUNITY): Payer: Self-pay | Admitting: Internal Medicine

## 2012-10-02 ENCOUNTER — Ambulatory Visit: Payer: 59 | Admitting: Family Medicine

## 2012-10-02 DIAGNOSIS — R932 Abnormal findings on diagnostic imaging of liver and biliary tract: Secondary | ICD-10-CM

## 2012-10-02 DIAGNOSIS — E119 Type 2 diabetes mellitus without complications: Secondary | ICD-10-CM

## 2012-10-02 DIAGNOSIS — R109 Unspecified abdominal pain: Secondary | ICD-10-CM | POA: Diagnosis present

## 2012-10-02 DIAGNOSIS — I81 Portal vein thrombosis: Principal | ICD-10-CM | POA: Diagnosis present

## 2012-10-02 DIAGNOSIS — N1330 Unspecified hydronephrosis: Secondary | ICD-10-CM | POA: Diagnosis present

## 2012-10-02 DIAGNOSIS — D509 Iron deficiency anemia, unspecified: Secondary | ICD-10-CM

## 2012-10-02 LAB — CBC WITH DIFFERENTIAL/PLATELET
Basophils Relative: 1 % (ref 0–1)
Eosinophils Absolute: 0.2 10*3/uL (ref 0.0–0.7)
Eosinophils Relative: 3 % (ref 0–5)
MCH: 27.2 pg (ref 26.0–34.0)
MCHC: 32.2 g/dL (ref 30.0–36.0)
Neutrophils Relative %: 55 % (ref 43–77)
Platelets: 209 10*3/uL (ref 150–400)
RDW: 14.8 % (ref 11.5–15.5)

## 2012-10-02 LAB — COMPREHENSIVE METABOLIC PANEL
ALT: 11 U/L (ref 0–35)
AST: 19 U/L (ref 0–37)
Alkaline Phosphatase: 44 U/L (ref 39–117)
CO2: 29 mEq/L (ref 19–32)
Chloride: 105 mEq/L (ref 96–112)
Creatinine, Ser: 0.73 mg/dL (ref 0.50–1.10)
GFR calc non Af Amer: 83 mL/min — ABNORMAL LOW (ref >90)
Potassium: 3.3 mEq/L — ABNORMAL LOW (ref 3.5–5.1)
Total Bilirubin: 0.3 mg/dL (ref 0.3–1.2)

## 2012-10-02 LAB — LACTIC ACID, PLASMA: Lactic Acid, Venous: 0.5 mmol/L (ref 0.5–2.2)

## 2012-10-02 LAB — GLUCOSE, CAPILLARY: Glucose-Capillary: 83 mg/dL (ref 70–99)

## 2012-10-02 MED ORDER — ACETAMINOPHEN 650 MG RE SUPP: 650.0000 mg | Freq: Four times a day (QID) | RECTAL | Status: DC | PRN

## 2012-10-02 MED ORDER — ONDANSETRON HCL 4 MG/2ML IJ SOLN: 4.0000 mg | Freq: Four times a day (QID) | INTRAMUSCULAR | Status: DC | PRN

## 2012-10-02 MED ORDER — HEPARIN (PORCINE) IN NACL 100-0.45 UNIT/ML-% IJ SOLN
1500.0000 [IU]/h | INTRAMUSCULAR | Status: DC
  Administered 2012-10-02 – 2012-10-03 (×2): 1500 [IU]/h via INTRAVENOUS
  Filled 2012-10-02 (×3): qty 250

## 2012-10-02 MED ORDER — FESOTERODINE FUMARATE ER 4 MG PO TB24
4.0000 mg | ORAL_TABLET | Freq: Every day | ORAL | Status: DC
  Administered 2012-10-02 – 2012-10-06 (×5): 4 mg via ORAL
  Filled 2012-10-02 (×6): qty 1

## 2012-10-02 MED ORDER — ACETAMINOPHEN 325 MG PO TABS: 650.0000 mg | ORAL_TABLET | Freq: Four times a day (QID) | ORAL | Status: DC | PRN

## 2012-10-02 MED ORDER — MORPHINE SULFATE 2 MG/ML IJ SOLN
1.0000 mg | INTRAMUSCULAR | Status: DC | PRN
  Administered 2012-10-02 – 2012-10-03 (×4): 1 mg via INTRAVENOUS
  Filled 2012-10-02 (×4): qty 1

## 2012-10-02 MED ORDER — HYDRALAZINE HCL 20 MG/ML IJ SOLN: 10.0000 mg | INTRAMUSCULAR | Status: DC | PRN

## 2012-10-02 MED ORDER — HEPARIN BOLUS VIA INFUSION
2500.0000 [IU] | Freq: Once | INTRAVENOUS | Status: AC
  Administered 2012-10-02: 2500 [IU] via INTRAVENOUS
  Filled 2012-10-02: qty 2500

## 2012-10-02 MED ORDER — SODIUM CHLORIDE 0.9 % IV SOLN
INTRAVENOUS | Status: AC
  Administered 2012-10-02 (×2): via INTRAVENOUS

## 2012-10-02 MED ORDER — ONDANSETRON HCL 4 MG PO TABS: 4.0000 mg | ORAL_TABLET | Freq: Four times a day (QID) | ORAL | Status: DC | PRN

## 2012-10-02 MED ORDER — PANTOPRAZOLE SODIUM 40 MG PO TBEC
40.0000 mg | DELAYED_RELEASE_TABLET | Freq: Every day | ORAL | Status: DC
  Administered 2012-10-02 – 2012-10-07 (×6): 40 mg via ORAL
  Filled 2012-10-02 (×6): qty 1

## 2012-10-02 MED ORDER — HEPARIN BOLUS VIA INFUSION
3000.0000 [IU] | Freq: Once | INTRAVENOUS | Status: AC
  Administered 2012-10-02: 3000 [IU] via INTRAVENOUS
  Filled 2012-10-02: qty 3000

## 2012-10-02 MED ORDER — TRAZODONE HCL 100 MG PO TABS: 100.0000 mg | ORAL_TABLET | Freq: Every day | ORAL | Status: DC

## 2012-10-02 MED ORDER — POTASSIUM CHLORIDE CRYS ER 20 MEQ PO TBCR
40.0000 meq | EXTENDED_RELEASE_TABLET | Freq: Once | ORAL | Status: AC
  Administered 2012-10-02: 40 meq via ORAL
  Filled 2012-10-02: qty 2

## 2012-10-02 MED ORDER — LEVOTHYROXINE SODIUM 100 MCG IV SOLR
44.0000 ug | Freq: Every day | INTRAVENOUS | Status: DC
  Administered 2012-10-02 – 2012-10-04 (×3): 44 ug via INTRAVENOUS
  Filled 2012-10-02 (×3): qty 5

## 2012-10-02 MED ORDER — INSULIN GLARGINE 100 UNIT/ML ~~LOC~~ SOLN
10.0000 [IU] | Freq: Every day | SUBCUTANEOUS | Status: DC
  Filled 2012-10-02 (×2): qty 0.1

## 2012-10-02 MED ORDER — TRAZODONE HCL 50 MG PO TABS
50.0000 mg | ORAL_TABLET | Freq: Every day | ORAL | Status: DC
  Administered 2012-10-02 – 2012-10-03 (×2): 50 mg via ORAL
  Filled 2012-10-02 (×6): qty 1

## 2012-10-02 MED ORDER — HEPARIN (PORCINE) IN NACL 100-0.45 UNIT/ML-% IJ SOLN
1300.0000 [IU]/h | INTRAMUSCULAR | Status: DC
  Administered 2012-10-02: 1300 [IU]/h via INTRAVENOUS
  Filled 2012-10-02: qty 250

## 2012-10-02 MED ORDER — SODIUM CHLORIDE 0.9 % IJ SOLN
3.0000 mL | Freq: Two times a day (BID) | INTRAMUSCULAR | Status: DC
  Administered 2012-10-02 – 2012-10-04 (×2): 3 mL via INTRAVENOUS

## 2012-10-02 MED ORDER — INSULIN ASPART 100 UNIT/ML ~~LOC~~ SOLN
0.0000 [IU] | Freq: Three times a day (TID) | SUBCUTANEOUS | Status: DC
  Administered 2012-10-02 – 2012-10-04 (×3): 1 [IU] via SUBCUTANEOUS
  Administered 2012-10-04: 2 [IU] via SUBCUTANEOUS
  Administered 2012-10-04: 1 [IU] via SUBCUTANEOUS
  Administered 2012-10-05: 2 [IU] via SUBCUTANEOUS
  Administered 2012-10-05: 1 [IU] via SUBCUTANEOUS
  Administered 2012-10-05 – 2012-10-06 (×2): 2 [IU] via SUBCUTANEOUS
  Administered 2012-10-06: 1 [IU] via SUBCUTANEOUS
  Administered 2012-10-07: 2 [IU] via SUBCUTANEOUS
  Administered 2012-10-07: 1 [IU] via SUBCUTANEOUS

## 2012-10-02 NOTE — Progress Notes (Signed)
ANTICOAGULATION CONSULT NOTE - Initial Consult  Pharmacy Consult for Heparin Indication: Portal vein thrombosis  Allergies  Allergen Reactions  . Oxycodone-Acetaminophen Nausea Only  . Zithromax [Azithromycin]     thrush    Patient Measurements: Height: 5' 6.53" (169 cm) Weight: 235 lb 14.3 oz (107 kg) IBW/kg (Calculated) : 60.53 Heparin Dosing Weight: 85 kg  Vital Signs: Temp: 98 F (36.7 C) (08/13 0220) Temp src: Oral (08/12 1640) BP: 138/57 mmHg (08/13 0220) Pulse Rate: 85 (08/13 0220)  Labs:  Recent Labs  10/01/12 1705 10/01/12 2351  HGB 10.7*  --   HCT 32.6*  --   PLT 220  --   APTT  --  35  CREATININE 0.71  --     Estimated Creatinine Clearance: 79.4 ml/min (by C-G formula based on Cr of 0.71).   Medical History: Past Medical History  Diagnosis Date  . Trigger finger   . Hyperlipidemia   . Diabetes mellitus   . Chronic venous insufficiency   . Morbid obesity   . Hypertension   . Thyroid disease   . GERD (gastroesophageal reflux disease)     Medications:  Prescriptions prior to admission  Medication Sig Dispense Refill  . acetaminophen (TYLENOL) 325 MG tablet Take 325 mg by mouth every 4 (four) hours as needed for pain. Per bottle      . Biotin 5000 MCG CAPS Take 5,000 mcg by mouth daily.       Marland Kitchen doxazosin (CARDURA) 8 MG tablet Take 8 mg by mouth daily.      Marland Kitchen escitalopram (LEXAPRO) 20 MG tablet Take 1 tablet (20 mg total) by mouth daily.  30 tablet  6  . famotidine (PEPCID) 20 MG tablet Take 20 mg by mouth daily as needed for heartburn.       . fesoterodine (TOVIAZ) 4 MG TB24 tablet Take 4 mg by mouth at bedtime.      . furosemide (LASIX) 40 MG tablet Take 40 mg by mouth daily.      . insulin glargine (LANTUS) 100 UNIT/ML injection Inject 70 Units into the skin every morning.       Marland Kitchen levothyroxine (SYNTHROID, LEVOTHROID) 88 MCG tablet Take 88 mcg by mouth daily.      . Liraglutide (VICTOZA) 18 MG/3ML SOLN Inject 1.8 mg into the skin daily. 1.8mg   subcutaneous every morning      . loratadine (CLARITIN) 10 MG tablet Take 10 mg by mouth daily as needed for allergies.       Marland Kitchen losartan (COZAAR) 25 MG tablet Take 25 mg by mouth daily.      . metFORMIN (GLUCOPHAGE) 1000 MG tablet Take 1,000 mg by mouth 2 (two) times daily with a meal.      . pantoprazole (PROTONIX) 40 MG tablet Take 40 mg by mouth daily.      . potassium chloride (KLOR-CON) 20 MEQ packet Take 20 mEq by mouth daily.      . simvastatin (ZOCOR) 20 MG tablet Take 1 tablet (20 mg total) by mouth at bedtime.  90 tablet  3  . traZODone (DESYREL) 100 MG tablet Take 100 mg by mouth at bedtime. Take 2 by mouth at bedtime      . pregabalin (LYRICA) 75 MG capsule 1 cap every morning and 1 at bedtime        Assessment: 72 yo female with abdominal pain found to have portal vein thrombosis for heparin  Goal of Therapy:  Heparin level 0.3-0.7 units/ml Monitor platelets by anticoagulation  protocol: Yes   Plan:  Heparin 3000 units IV bolus, then 1300 units/hr Check heparin level in 6 hours.   Theresa Summers 10/02/2012,4:30 AM

## 2012-10-02 NOTE — Progress Notes (Signed)
[facility] PROGRESS NOTE  Theresa Summers:[id number] DOB: 03-01-1940 DOA: 10/01/2012 PCP: Ruthe Mannan, MD  Assessment/Plan: Principal Problem:   Abdominal pain Active Problems:   DM   HYPERTENSION   Unspecified hypothyroidism   Portal vein thrombosis   Hydronephrosis    1. Portal vein thrombosis: Patient presented with progressive abdominal pain over 10 days, and CT Abdomen/Pelvis demonstrated portal venous thrombosis with surrounding stranding and extension to the superior mesenteric vein, without evidence for bowel compromise. Initial discussion was held with Dr. Stan Head, GI, who recommended ivi Heparin infusion. Managing as recommended. Patient was placed on bowel rest, ivi fluids, and analgesics. Seen by GI today, and diet advanced to full liquids. 2. Right-sided hydronephrosis: This was an incidental finding on CT scan, which revealed moderate right hydroureteronephrosis, without other complicating features such as calculus. Tthere was mild hydronephrosis described on the remote prior exam. Patient's creatinine is normal at 0.73, and urinalysis is negative. We shall consult urologist on 10/03/12. 3. Pancreatic lesion seen in CT scan: Yet another incidental finding. Etiology is indeterminate, but GI has ordered Ca19-9 tumor marker. further recommendations per GI. Further work-up per GI, but it seems MRI is impracticable due to severe claustrophobia, and CT abdomen with pancreatic protocol, is being contemplated in lieu. 4. Anemia: This is mild, normocytic. Will check anemia panel and follow CBC. 5. Diabetes mellitus 2: This is insulin-requiring type 2. CBGs have remained satisfactory since admission, so managing with SSI/Low dose Lantus. Will adjust as indicated.  6. Hypothyroidism: On Thyroxine replacement Therapy.  7. Hypertension: Reasonably controlled on prn Hydralazine.   Code Status: Full Code.  Family Communication:  Disposition Plan: To be determined.    Brief  narrative: 72 y.o. female with history of diabetes mellitus type 2, hypertension, hyperlipidemia, hypothyroidism, GERD, presenting to the ER with complaints of abdominal pain over the last 10 days, initially in the epigastric area, then became more diffuse. Pain is present even without eating and eating increases the pain. Patient had one episode nausea and vomiting. Denies any diarrhea and has had regular bowel movement. In the ER CT Abdomen/Pelvis shows features concerning for portal vein thrombosis and right-sided hydronephrosis. Patient was admitted for further management.   Consultants:  GI.   Procedures:  CT Abdomen/Pelvis.   Antibiotics:  N/A.   HPI/Subjective: No new issues. Feels better.   Objective: Vital signs in last 24 hours: Temp:  [98 F (36.7 C)-98.7 F (37.1 C)] 98.7 F (37.1 C) (08/13 1403) Pulse Rate:  [69-85] 69 (08/13 1403) Resp:  [16-18] 18 (08/13 1403) BP: (121-169)/(46-67) 149/62 mmHg (08/13 1403) SpO2:  [95 %-98 %] 97 % (08/13 1403) Weight:  [107 kg (235 lb 14.3 oz)] 107 kg (235 lb 14.3 oz) (08/13 0300) Weight change:  Last BM Date: 10/01/12  Intake/Output from previous day:       Physical Exam: General: Comfortable, alert, communicative, fully oriented, not short of breath at rest.  HEENT:  Mild clinical pallor, no jaundice, no conjunctival injection or discharge. NECK:  Supple, JVP not seen, no carotid bruits, no palpable lymphadenopathy, no palpable goiter. CHEST:  Clinically clear to auscultation, no wheezes, no crackles. HEART:  Sounds 1 and 2 heard, normal, regular, no murmurs. ABDOMEN:  Full, soft, non-tender, only mild generalized discomfort, no palpable organomegaly, no palpable masses, normal bowel sounds. GENITALIA:  Not examined. LOWER EXTREMITIES:  No pitting edema, palpable peripheral pulses. MUSCULOSKELETAL SYSTEM:  Generalized osteoarthritic changes, otherwise, normal. CENTRAL NERVOUS SYSTEM:  No focal neurologic deficit on  gross examination.  Lab Results:  Recent Labs  10/01/12 1705 10/02/12 0515  WBC 6.5 6.4  HGB 10.7* 9.2*  HCT 32.6* 28.6*  PLT 220 209    Recent Labs  10/01/12 1705 10/02/12 0515  NA 141 142  K 3.7 3.3*  CL 102 105  CO2 29 29  GLUCOSE 169* 80  BUN 9 7  CREATININE 0.71 0.73  CALCIUM 9.6 8.9   No results found for this or any previous visit (from the past 240 hour(s)).   Studies/Results: Dg Chest 2 View  10/01/2012   *RADIOLOGY REPORT*  Clinical Data: Epigastric pain  CHEST - 2 VIEW  Comparison: Radiograph 11/21 1011  Findings: Normal cardiac silhouette.  There is mild scarring in the lingula unchanged from prior.  No effusion, infiltrate, or pneumothorax. Degenerative osteophytosis of the thoracic spine.  IMPRESSION: No acute cardiopulmonary process.   Original Report Authenticated By: Genevive Bi, M.D.   US Abdomen Complete  10/01/2012   *RADIOLOGY REPORT*  Clinical Data:  Abdominal pain, fatigue and right flank pain  COMPLETE ABDOMINAL ULTRASOUND  Comparison:  None.  Findings:  Gallbladder:  There are multiple dependent gallstones in a distended gallbladder.  There is no wall thickening or pericholecystic fluid.  There is no evidence of acute cholecystitis.  Common bile duct:  The common bile duct is mildly dilated measuring just over 8 mm.  No duct stone is seen.  The distal duct is not well visualized.  Liver:  The liver is echogenic consistent with diffuse fatty infiltration.  It is also somewhat heterogeneous in echogenicity, but no discrete mass or focal lesion is seen.  IVC:  Appears normal.  Pancreas:  Limited visualization.  Portions of the distal tail and inferior head were obscured by bowel gas.  The portions seen are echogenic but otherwise unremarkable.  Spleen:  Normal in size measuring 8.9 cm.  Right Kidney:  Moderate right hydronephrosis with dilated of the proximal right ureter.  No right renal mass or stone. Right kidney measures 13.5 cm in length.  Left Kidney:   No hydronephrosis.  Cyst arises from the mid pole measuring 3 cm.  No other renal masses.  Left kidney measures 12.1 cm in length.  Abdominal aorta:  Aorta not well seen due to overlying bowel gas.  Bladder was imaged to evaluate for ureteral jets.  Ureteral jets were visualized.  IMPRESSION: Multiple gallstones but no evidence of acute cholecystitis.  Mild dilation of the common bile duct.  This most likely is chronic.  Consider a distal duct stone if there are symptoms consistent with this.  The distal duct was not visualized on the study.  Moderate right hydronephrosis.  The etiology of this is unclear. This would be better evaluated with abdominal pelvic CT.  Fatty infiltration of the liver.   Original Report Authenticated By: Amie Portland, M.D.   Ct Abdomen Pelvis W Contrast  10/01/2012   *RADIOLOGY REPORT*  Clinical Data: Abdominal pain, right flank pain  CT ABDOMEN AND PELVIS WITH CONTRAST  Technique:  Multidetector CT imaging of the abdomen and pelvis was performed following the standard protocol during bolus administration of intravenous contrast.  Contrast: OMNIPAQUE IOHEXOL 300 MG/ML  SOLN  Comparison: Abdominal ultrasound 10/01/2012, report of prior CT 11/17/1997 images not digitized, lumbar spine radiographs 11/03/2011.  Findings: Motion degrades imaging at the lung bases.  4 mm pleural parenchymal right lower lobe nodule image 3 is noted.  Trace pleural effusions.  Curvilinear bilateral lower lobe presumed atelectasis or scarring.  Trace perihepatic fluid is identified tracking along the pericolic gutter to the pelvis.  Gallstones are re-identified.  There is portal venous thrombosis with extension to the superior mesenteric vein and surrounding perivenous stranding.  For example, see image 40. Celiac axis, superior mesenteric artery, and IMA are patent allowing for technique.  The splenic vein is patent up to the portosplenic confluence.  There is moderate right hydroureteronephrosis with  ureteral decompression as it crosses over the iliac vessels, for example image 74.  No extrinsic or intrinsic mass is identified allowing for technique.  No radiopaque renal, ureteral, or bladder calculus. Renal enhancement is normal with excretion into the collecting systems.  No perinephric stranding or fluid.  There is an ill-defined 1.2 cm hypodense lesion within the head of the pancreas image 41.  No specific peripancreatic stranding separate from that seen adjacent to the portal vein.  5mm similar hypodense lesion body of pancreas. No pancreatic dilatation.  Spleen and adrenal glands are normal.  Too small to characterize upper renal pole cortical hypodensities noted with left mid renal cortical 2.9 cm cyst.  Uterus demonstrate calcifications which could be related to involuted fibroid or vascular calcifications. The ovaries are normal.  No lymphadenopathy or free air.  No bowel wall thickening or focal segmental dilatation.  No acute osseous finding. Degenerative changes are noted in the spine.  Lower thoracic mild compression deformities are stable.  IMPRESSION: Addition of multiple complex findings as above.  The most acute appearing finding is the portal venous thrombosis with surrounding stranding and extension to the superior mesenteric vein, without evidence for bowel compromise. Occult bowel ischemia or enteritis may not be apparent on CT.  Right moderate hydroureteronephrosis is noted without other complicating features such as calculus; there was mild hydronephrosis described on the remote prior exam and this could represent stricture or other longstanding finding but an intrinsic mass or nonradiopaque calculus could have this appearance.  Hypodense pancreatic lesions which are incompletely characterized. The patient reportedly has no evidence for pancreatitis, for which there could also be association with portal venous thrombosis.  Gallstones without other evidence for acute cholecystitis.   Dedicated non emergent pancreatic and renal imaging are recommended, with modalities tailored to the patient's current course of treatment and clinical condition.  These results were called by telephone on 10/01/2012 at 11:20 p.m. to Dr. Trellis Moment, who verbally acknowledged these results.   Original Report Authenticated By: Christiana Pellant, M.D.    Medications: Scheduled Meds: . insulin aspart  0-9 Units Subcutaneous TID WC  . insulin glargine  10 Units Subcutaneous QHS  . levothyroxine  44 mcg Intravenous Daily  . pantoprazole  40 mg Oral Q0600  . sodium chloride  3 mL Intravenous Q12H   Continuous Infusions: . sodium chloride 100 mL/hr at 10/02/12 0450  . heparin 1,500 Units/hr (10/02/12 1327)   PRN Meds:.acetaminophen, acetaminophen, hydrALAZINE, morphine injection, ondansetron (ZOFRAN) IV, ondansetron    LOS: 1 day   Rosalene Wardrop,CHRISTOPHER  [facility] Pager [phone]. If 8PM-8AM, please contact night-coverage at [email or web address], password [facility] 10/02/2012, 3:52 PM  LOS: 1 day

## 2012-10-02 NOTE — Progress Notes (Signed)
10/02/2012 9:11 PM  Informed by RN that heparin was off from ~ 1800-2030 due to loss of IV access.  Heparin was restarted ~ 2030 at previous rate of 1500 units/hr.  Will time next heparin level with tomorrow's AM labs.   [employer], Pharm.D., BCPS Clinical Pharmacist Pager [phone]

## 2012-10-02 NOTE — ED Provider Notes (Signed)
Medical screening examination/treatment/procedure(s) were conducted as a shared visit with non-physician practitioner(s) and myself.  I personally evaluated the patient during the encounter  61F with diffuse abdominal pain for past 10 days. Vague symptoms, occassional nausea, emesis. No fevers or diarrhea. AFVSS. Diffuse abdominal pain on exam, no guarding or rebound. Initial RUQ negative. CT of abdomen with multiple concerning findings including pancreatic lesion, portal venous thrombosis. Medicine admitting for further workup.  Dagmar Hait, MD 10/02/12 1030

## 2012-10-02 NOTE — Consult Note (Signed)
[facility] Consult: 8:31 AM 10/02/2012   Referring Provider: Dr Toniann Fail  Primary Care Physician:  Ruthe Mannan, MD Primary Gastroenterologist:  None unassigned pt    Reason for Consultation:  Abdominal pain.  Portal vein thrombosis and pancreatic lesions.   HPI: Theresa Summers is a 72 y.o. female.  Has typ 2 IDDM, obesity, GERD, peripheral neuropathy.  2 weeks ago developed epigastric pain, abdominal bloating.  She thought this was GERD, already being treated long term with Omeprazole and Famotidine.  Pain then radiated into abdomen.  Pain increased post prandial, for last week mainly eating soup.  Abdominal bloating and increased girth progessed. Some tenderness in right flank. No change in bowel or urinary habits.  No nausea, though had single event of N/non-bloody emesis yesterday after eating crackers.  Pain relieved with Tramadol, but only used this 3 x since onset of sxs.  Added frequent Tums, but this not helping.   Imaging with CT yesterday shows indeterminate pancreatic lesions, portal vein thrombosis, right hydroureteronephrosis, fatty liver.  Dedicated pancreatic and renal imaging are recommended.  LFTs, Lipase, renal function are normal. Has hgb of 9.2, normal MCV. Hgb in 11/2009 was 15.0.     Past Medical History  Diagnosis Date  . Trigger finger   . Hyperlipidemia   . Diabetes mellitus   . Chronic venous insufficiency   . Morbid obesity   . Hypertension   . Thyroid disease   . GERD (gastroesophageal reflux disease)     Past Surgical History  Procedure Laterality Date  . Lumbar laminectomy      Prior to Admission medications   Medication Sig Start Date End Date Taking? Authorizing Provider  acetaminophen (TYLENOL) 325 MG tablet Take 325 mg by mouth every 4 (four) hours as needed for pain. Per bottle   Yes Historical Provider, MD  Biotin 5000 MCG CAPS Take 5,000 mcg by mouth daily.    Yes Historical Provider, MD   doxazosin (CARDURA) 8 MG tablet Take 8 mg by mouth daily. 05/28/12  Yes Dianne Dun, MD  escitalopram (LEXAPRO) 20 MG tablet Take 1 tablet (20 mg total) by mouth daily. 07/09/12  Yes Dianne Dun, MD  famotidine (PEPCID) 20 MG tablet Take 20 mg by mouth daily as needed for heartburn.    Yes Historical Provider, MD  fesoterodine (TOVIAZ) 4 MG TB24 tablet Take 4 mg by mouth at bedtime.   Yes Historical Provider, MD  furosemide (LASIX) 40 MG tablet Take 40 mg by mouth daily.   Yes Historical Provider, MD  insulin glargine (LANTUS) 100 UNIT/ML injection Inject 70 Units into the skin every morning.    Yes Historical Provider, MD  levothyroxine (SYNTHROID, LEVOTHROID) 88 MCG tablet Take 88 mcg by mouth daily.   Yes Historical Provider, MD  Liraglutide (VICTOZA) 18 MG/3ML SOLN Inject 1.8 mg into the skin daily. 1.8mg  subcutaneous every morning   Yes Historical Provider, MD  loratadine (CLARITIN) 10 MG tablet Take 10 mg by mouth daily as needed for allergies.    Yes Historical Provider, MD  losartan (COZAAR) 25 MG tablet Take 25 mg by mouth daily.   Yes Historical Provider, MD  metFORMIN (GLUCOPHAGE) 1000 MG tablet Take 1,000 mg by mouth 2 (two) times daily with a meal.   Yes Historical Provider, MD  pantoprazole (PROTONIX) 40 MG tablet Take 40 mg by mouth daily.   Yes Historical Provider, MD  potassium chloride (KLOR-CON) 20 MEQ packet Take 20 mEq by mouth daily.   Yes Historical Provider,  MD  simvastatin (ZOCOR) 20 MG tablet Take 1 tablet (20 mg total) by mouth at bedtime. 05/28/12  Yes Dianne Dun, MD  traZODone (DESYREL) 100 MG tablet Take 100 mg by mouth at bedtime. Take 2 by mouth at bedtime 06/19/11 10/01/12 Yes Nyoka Cowden, MD  pregabalin (LYRICA) 75 MG capsule 1 cap every morning and 1 at bedtime    Historical Provider, MD    Scheduled Meds: . insulin aspart  0-9 Units Subcutaneous TID WC  . insulin glargine  10 Units Subcutaneous QHS  . levothyroxine  44 mcg Intravenous Daily  . sodium  chloride  3 mL Intravenous Q12H   Infusions: . sodium chloride 100 mL/hr at 10/02/12 0450  . heparin 1,300 Units/hr (10/02/12 0552)   PRN Meds: acetaminophen, acetaminophen, hydrALAZINE, morphine injection, ondansetron (ZOFRAN) IV, ondansetron   Allergies as of 10/01/2012 - Review Complete 10/01/2012  Allergen Reaction Noted  . Oxycodone-acetaminophen Nausea Only   . Zithromax [azithromycin]  03/14/2011   Family history DM in sibs Idiopathic pancreatitis in dtr      Lymphoma in 2 sisters.   History   Social History  . Marital Status: Married    Spouse Name: N/A    Number of Children: N/A  . Years of Education: N/A   Occupational History  . Not on file.   Social History Main Topics  . Smoking status: Never Smoker   . Smokeless tobacco: Not on file  . Alcohol Use: No  . Drug Use: No  . Sexual Activity: Not on file   Other Topics Concern  . Not on file   Social History Narrative   Exercises 3-4 times a week   No caffeine   2 children - twins    REVIEW OF SYSTEMS:  Blood sugars normally ~ 140s, but in recent days dropping as low as 60s - 80s. No gait disturbance, dizziness, syncope, hematuria, extremity swelling.  No hx blood clots. No hx jaundice or liver problems.  No excessive bleeding.  Vaccinations up to date. No itching, sores.  + pedal numbness and burning.  No cough, no SOB.  No CP or palpitations.   PHYSICAL EXAM: Vital signs in last 24 hours: Temp:  [98 F (36.7 C)-98.5 F (36.9 C)] 98.4 F (36.9 C) (08/13 0556) Pulse Rate:  [69-85] 69 (08/13 0556) Resp:  [16] 16 (08/13 0556) BP: (121-169)/(46-67) 121/46 mmHg (08/13 0556) SpO2: 98 % (08/13 0556) Weight:  107 kg/235 lb 14.3 oz  General: Obese, cushingoid looking WF.  She is comfortable does not look acutely ill Head:  No asymmetry.  + cushingoid faces  Eyes:  No icterus or pallor Ears:  Not HOH  Nose:  No discharge, no sneezing Mouth:  Clear, moist oral mm.  Tongue midline Neck:  No mass, no  bruits, no JVD. Lungs:  Crackles in bases.  No dyspnea or cough Heart: RRR.  No MRG Abdomen:  Obese, distended, moderately soft, moderately tender in upper belly.  BS present.  No mass, no HSM.  Small midline abd wall diathesis. Rectal: not done   Musc/Skeltl: no joint contractures Extremities:  Non-pitting pedal swelling.   Neurologic:  Oriented x 3.  No tremor, no asterixis.  No gross deficits Skin:  No angiomata, no bruises, no worrisome pigmented lesions.  Tattoos:  none Nodes:  No cervical or inguinal adenopathy   Psych:  Pleasant, alert, appropriate.   LAB RESULTS:  Recent Labs  10/01/12 1705 10/02/12 0515  WBC 6.5 6.4  HGB 10.7* 9.2*  HCT 32.6* 28.6*  PLT 220 209  MCV    84  Lab Results  Component Value Date   NA 142 10/02/2012   NA 141 10/01/2012   NA 145 11/24/2009   K 3.3* 10/02/2012   K 3.7 10/01/2012   K 4.0 11/24/2009   CL 105 10/02/2012   CL 102 10/01/2012   CL 104 12/28/2006   CO2 29 10/02/2012   CO2 29 10/01/2012   CO2 33* 12/27/2006   GLUCOSE 80 10/02/2012   GLUCOSE 169* 10/01/2012   GLUCOSE 126* 11/24/2009   BUN 7 10/02/2012   BUN 9 10/01/2012   BUN 16 12/28/2006   CREATININE 0.73 10/02/2012   CREATININE 0.71 10/01/2012   CREATININE 0.5 12/27/2006   CALCIUM 8.9 10/02/2012   CALCIUM 9.6 10/01/2012   CALCIUM 9.4 12/27/2006     Recent Labs  10/01/12 1705 10/02/12 0515  PROT 7.1 5.9*  ALBUMIN 3.6 3.0*  AST 21 19  ALT 14 11  ALKPHOS 56 44  BILITOT 0.2* 0.3     RADIOLOGY STUDIES: Dg Chest 2 View 10/01/2012    Findings: Normal cardiac silhouette.  There is mild scarring in the lingula unchanged from prior.  No effusion, infiltrate, or pneumothorax. Degenerative osteophytosis of the thoracic spine.  IMPRESSION: No acute cardiopulmonary process.   Original Report Authenticated By: Genevive Bi, M.D.   US Abdomen Complete 10/01/2012    Findings:  Gallbladder:  There are multiple dependent gallstones in a distended gallbladder.  There is no wall thickening or  pericholecystic fluid.  There is no evidence of acute cholecystitis.  Common bile duct:  The common bile duct is mildly dilated measuring just over 8 mm.  No duct stone is seen.  The distal duct is not well visualized.  Liver:  The liver is echogenic consistent with diffuse fatty infiltration.  It is also somewhat heterogeneous in echogenicity, but no discrete mass or focal lesion is seen.  IVC:  Appears normal.  Pancreas:  Limited visualization.  Portions of the distal tail and inferior head were obscured by bowel gas.  The portions seen are echogenic but otherwise unremarkable.  Spleen:  Normal in size measuring 8.9 cm.  Right Kidney:  Moderate right hydronephrosis with dilated of the proximal right ureter.  No right renal mass or stone. Right kidney measures 13.5 cm in length.  Left Kidney:  No hydronephrosis.  Cyst arises from the mid pole measuring 3 cm.  No other renal masses.  Left kidney measures 12.1 cm in length.  Abdominal aorta:  Aorta not well seen due to overlying bowel gas.  Bladder was imaged to evaluate for ureteral jets.  Ureteral jets were visualized.  IMPRESSION: Multiple gallstones but no evidence of acute cholecystitis.  Mild dilation of the common bile duct.  This most likely is chronic.  Consider a distal duct stone if there are symptoms consistent with this.  The distal duct was not visualized on the study.  Moderate right hydronephrosis.  The etiology of this is unclear. This would be better evaluated with abdominal pelvic CT.  Fatty infiltration of the liver.   Original Report Authenticated By: Amie Portland, M.D.   Ct Abdomen Pelvis W Contrast 10/01/2012     Findings: Motion degrades imaging at the lung bases.  4 mm pleural parenchymal right lower lobe nodule image 3 is noted.  Trace pleural effusions.  Curvilinear bilateral lower lobe presumed atelectasis or scarring.  Trace perihepatic fluid is identified tracking along the pericolic gutter to the pelvis.  Gallstones are re-identified.   There is portal venous thrombosis with extension to the superior mesenteric vein and surrounding perivenous stranding.  For example, see image 40. Celiac axis, superior mesenteric artery, and IMA are patent allowing for technique.  The splenic vein is patent up to the portosplenic confluence.  There is moderate right hydroureteronephrosis with ureteral decompression as it crosses over the iliac vessels, for example image 74.  No extrinsic or intrinsic mass is identified allowing for technique.  No radiopaque renal, ureteral, or bladder calculus. Renal enhancement is normal with excretion into the collecting systems.  No perinephric stranding or fluid.  There is an ill-defined 1.2 cm hypodense lesion within the head of the pancreas image 41.  No specific peripancreatic stranding separate from that seen adjacent to the portal vein.  5mm similar hypodense lesion body of pancreas. No pancreatic dilatation.  Spleen and adrenal glands are normal.  Too small to characterize upper renal pole cortical hypodensities noted with left mid renal cortical 2.9 cm cyst.  Uterus demonstrate calcifications which could be related to involuted fibroid or vascular calcifications. The ovaries are normal.  No lymphadenopathy or free air.  No bowel wall thickening or focal segmental dilatation.  No acute osseous finding. Degenerative changes are noted in the spine.  Lower thoracic mild compression deformities are stable.  IMPRESSION: Addition of multiple complex findings as above.  The most acute appearing finding is the portal venous thrombosis with surrounding stranding and extension to the superior mesenteric vein, without evidence for bowel compromise. Occult bowel ischemia or enteritis may not be apparent on CT.  Right moderate hydroureteronephrosis is noted without other complicating features such as calculus; there was mild hydronephrosis described on the remote prior exam and this could represent stricture or other longstanding  finding but an intrinsic mass or nonradiopaque calculus could have this appearance.  Hypodense pancreatic lesions which are incompletely characterized. The patient reportedly has no evidence for pancreatitis, for which there could also be association with portal venous thrombosis.  Gallstones without other evidence for acute cholecystitis.  Dedicated non emergent pancreatic and renal imaging are recommended, with modalities tailored to the patient's current course of treatment and clinical condition.  These results were called by telephone on 10/01/2012 at 11:20 p.m. to Dr. Trellis Moment, who verbally acknowledged these results.   Original Report Authenticated By: Christiana Pellant, M.D.    ENDOSCOPIC STUDIES: No EGD or colonoscopy (pt declined latter in past)  IMPRESSION: *   Portal venous thrombosis. Heparin initiated.  *   Pancreatic lesions. Need further eval.  *   Gallstones without pancreatitis.  *   Right  Hydroureteronephrosis.  Unremarkable UA *   Normocytic anemia. *   IDDM, type 2 DM *   Morbid obesity.   PLAN: *  CT abdomen with pancreatic protocol.  Do this tomorrow AM, to give kidneys time to flush and recover from IV dye load of last night.  MRI imaging could be equally useful but pt informs me of her claustrophobia and previous negative experience with MRI.  The radiologist, Dr Rito Ehrlich, with whom I discussed the case,  emphasized need for pt to remain very still for MRI, so this is not going to be a viable option.   *   Ordered CA-19-9 level with AM labs.   *  ? Urology consult.   *  Continue heparin.  ? timing for starting Coumadin?    *  Continue daily PPI.   *   Full liquids.  LOS: 1 day   Jennye Moccasin  10/02/2012, 8:31 AM Pager: [phone] Attending MD note:   I have reviewed the above note, examined the patient and am concerned about possibility of small bowl ischemia due to venous thrombosis, but so far their is no sign of peritonitis and she has normal WBC .  Another concern is possible  biliary pancreatitis, there is periportal stranding and pancreatic lesion which will be evaluated tomorrow by CT scan with pancreatic protocol.Tumor markers are pending. There has been no weight loss , all symptoms seem to be acute. Consider hypercoagulable state causing portal vein thrombosis.or a paraneoplastic syndrome Anticoagulation indicated for acute thrombosis but not for chronic occlusion which is usually an incidental finding.The abdominal pain is worse within 5 minutes postprandially, so we may at some point do EGD/colonoscopy to assess anemia and possible GIB ( she is heme negative on my exam).  [facility] Pager # [phone]

## 2012-10-02 NOTE — H&P (Signed)
[facility] History and Physical  Theresa Summers ZOX:[id number] DOB: 1940-09-06 DOA: 10/01/2012  Referring physician: ER physician. PCP: Ruthe Mannan, MD   Chief Complaint: Abdominal pain.  HPI: Theresa Summers is a 72 y.o. female history of diabetes mellitus type 2, hypertension, hyperlipidemia, hypothyroidism presented to the ER with complaints of abdominal pain. Patient has been having abdominal pain over the last 10 days. Initially started off in the epigastric area and became more diffuse. Pain is present even without eating and eating increases the pain. Patient had one episode nausea vomiting history. Denies any diarrhea and has had regular bowel movement. In the ER CT abdomen and pelvis shows features concerning for portal vein thrombosis and right-sided hydronephrosis. Patient has been admitted for further management. Patient denies any chest pain or shortness of breath.  Review of Systems: As presented in the history of presenting illness, rest negative.  Past Medical History  Diagnosis Date  . Trigger finger   . Hyperlipidemia   . Diabetes mellitus   . Chronic venous insufficiency   . Morbid obesity   . Hypertension   . Thyroid disease   . GERD (gastroesophageal reflux disease)    Past Surgical History  Procedure Laterality Date  . Lumbar laminectomy     Social History:  reports that she has never smoked. She does not have any smokeless tobacco history on file. She reports that she does not drink alcohol or use illicit drugs. Home. where does patient live-- Can do ADLs. Can patient participate in ADLs?  Allergies  Allergen Reactions  . Oxycodone-Acetaminophen Nausea Only  . Zithromax [Azithromycin]     thrush    Family History  Problem Relation Age of Onset  . Heart disease Father   . Arthritis Father   . Breast cancer Mother   . Lymphoma Sister     #1  . Diabetes Sister     #1  . Diabetes Sister     #2  . Diabetes Brother     #1  . Diabetes Brother      #2  . Diabetes Other     siblings  . Hypertension Other     siblings  . Arthritis Other     siblings      Prior to Admission medications   Medication Sig Start Date End Date Taking? Authorizing Provider  acetaminophen (TYLENOL) 325 MG tablet Take 325 mg by mouth every 4 (four) hours as needed for pain. Per bottle   Yes Historical Provider, MD  Biotin 5000 MCG CAPS Take 5,000 mcg by mouth daily.    Yes Historical Provider, MD  doxazosin (CARDURA) 8 MG tablet Take 8 mg by mouth daily. 05/28/12  Yes Dianne Dun, MD  escitalopram (LEXAPRO) 20 MG tablet Take 1 tablet (20 mg total) by mouth daily. 07/09/12  Yes Dianne Dun, MD  famotidine (PEPCID) 20 MG tablet Take 20 mg by mouth daily as needed for heartburn.    Yes Historical Provider, MD  fesoterodine (TOVIAZ) 4 MG TB24 tablet Take 4 mg by mouth at bedtime.   Yes Historical Provider, MD  furosemide (LASIX) 40 MG tablet Take 40 mg by mouth daily.   Yes Historical Provider, MD  insulin glargine (LANTUS) 100 UNIT/ML injection Inject 70 Units into the skin every morning.    Yes Historical Provider, MD  levothyroxine (SYNTHROID, LEVOTHROID) 88 MCG tablet Take 88 mcg by mouth daily.   Yes Historical Provider, MD  Liraglutide (VICTOZA) 18 MG/3ML SOLN Inject 1.8 mg into  the skin daily. 1.8mg  subcutaneous every morning   Yes Historical Provider, MD  loratadine (CLARITIN) 10 MG tablet Take 10 mg by mouth daily as needed for allergies.    Yes Historical Provider, MD  losartan (COZAAR) 25 MG tablet Take 25 mg by mouth daily.   Yes Historical Provider, MD  metFORMIN (GLUCOPHAGE) 1000 MG tablet Take 1,000 mg by mouth 2 (two) times daily with a meal.   Yes Historical Provider, MD  pantoprazole (PROTONIX) 40 MG tablet Take 40 mg by mouth daily.   Yes Historical Provider, MD  potassium chloride (KLOR-CON) 20 MEQ packet Take 20 mEq by mouth daily.   Yes Historical Provider, MD  simvastatin (ZOCOR) 20 MG tablet Take 1 tablet (20 mg total) by mouth at bedtime.  05/28/12  Yes Dianne Dun, MD  traZODone (DESYREL) 100 MG tablet Take 100 mg by mouth at bedtime. Take 2 by mouth at bedtime 06/19/11 10/01/12 Yes Nyoka Cowden, MD  pregabalin (LYRICA) 75 MG capsule 1 cap every morning and 1 at bedtime    Historical Provider, MD   Physical Exam: Filed Vitals:   10/01/12 1640 10/02/12 0220  BP: 169/67 138/57  Pulse: 85 85  Temp: 98.5 F (36.9 C) 98 F (36.7 C)  TempSrc: Oral   Resp: 16 16  SpO2: 96% 95%     General:  Well-developed well-nourished.  Eyes: Anicteric no pallor.  ENT: No discharge from the ears eyes nose or mouth.  Neck: No mass felt.  Cardiovascular: S1-S2 heard.  Respiratory: No rhonchi or crepitations.  Abdomen: Soft nontender bowel sounds present. No guarding rigidity or any discolorations.  Skin: No rash.  Musculoskeletal: No edema.  Psychiatric: Appears normal.  Neurologic: Alert awake oriented to time place and person. Moves all extremities.  Labs on Admission:  Basic Metabolic Panel:  Recent Labs Lab 10/01/12 1705  NA 141  K 3.7  CL 102  CO2 29  GLUCOSE 169*  BUN 9  CREATININE 0.71  CALCIUM 9.6   Liver Function Tests:  Recent Labs Lab 10/01/12 1705  AST 21  ALT 14  ALKPHOS 56  BILITOT 0.2*  PROT 7.1  ALBUMIN 3.6    Recent Labs Lab 10/01/12 1705  LIPASE 56   No results found for this basename: AMMONIA,  in the last 168 hours CBC:  Recent Labs Lab 10/01/12 1705  WBC 6.5  NEUTROABS 4.6  HGB 10.7*  HCT 32.6*  MCV 84.9  PLT 220   Cardiac Enzymes: No results found for this basename: CKTOTAL, CKMB, CKMBINDEX, TROPONINI,  in the last 168 hours  BNP (last 3 results) No results found for this basename: PROBNP,  in the last 8760 hours CBG:  Recent Labs Lab 10/02/12 0210  GLUCAP 72    Radiological Exams on Admission: Dg Chest 2 View  10/01/2012   *RADIOLOGY REPORT*  Clinical Data: Epigastric pain  CHEST - 2 VIEW  Comparison: Radiograph 11/21 1011  Findings: Normal cardiac  silhouette.  There is mild scarring in the lingula unchanged from prior.  No effusion, infiltrate, or pneumothorax. Degenerative osteophytosis of the thoracic spine.  IMPRESSION: No acute cardiopulmonary process.   Original Report Authenticated By: Genevive Bi, M.D.   US Abdomen Complete  10/01/2012   *RADIOLOGY REPORT*  Clinical Data:  Abdominal pain, fatigue and right flank pain  COMPLETE ABDOMINAL ULTRASOUND  Comparison:  None.  Findings:  Gallbladder:  There are multiple dependent gallstones in a distended gallbladder.  There is no wall thickening or pericholecystic fluid.  There is no evidence of acute cholecystitis.  Common bile duct:  The common bile duct is mildly dilated measuring just over 8 mm.  No duct stone is seen.  The distal duct is not well visualized.  Liver:  The liver is echogenic consistent with diffuse fatty infiltration.  It is also somewhat heterogeneous in echogenicity, but no discrete mass or focal lesion is seen.  IVC:  Appears normal.  Pancreas:  Limited visualization.  Portions of the distal tail and inferior head were obscured by bowel gas.  The portions seen are echogenic but otherwise unremarkable.  Spleen:  Normal in size measuring 8.9 cm.  Right Kidney:  Moderate right hydronephrosis with dilated of the proximal right ureter.  No right renal mass or stone. Right kidney measures 13.5 cm in length.  Left Kidney:  No hydronephrosis.  Cyst arises from the mid pole measuring 3 cm.  No other renal masses.  Left kidney measures 12.1 cm in length.  Abdominal aorta:  Aorta not well seen due to overlying bowel gas.  Bladder was imaged to evaluate for ureteral jets.  Ureteral jets were visualized.  IMPRESSION: Multiple gallstones but no evidence of acute cholecystitis.  Mild dilation of the common bile duct.  This most likely is chronic.  Consider a distal duct stone if there are symptoms consistent with this.  The distal duct was not visualized on the study.  Moderate right  hydronephrosis.  The etiology of this is unclear. This would be better evaluated with abdominal pelvic CT.  Fatty infiltration of the liver.   Original Report Authenticated By: Amie Portland, M.D.   Ct Abdomen Pelvis W Contrast  10/01/2012   *RADIOLOGY REPORT*  Clinical Data: Abdominal pain, right flank pain  CT ABDOMEN AND PELVIS WITH CONTRAST  Technique:  Multidetector CT imaging of the abdomen and pelvis was performed following the standard protocol during bolus administration of intravenous contrast.  Contrast: OMNIPAQUE IOHEXOL 300 MG/ML  SOLN  Comparison: Abdominal ultrasound 10/01/2012, report of prior CT 11/17/1997 images not digitized, lumbar spine radiographs 11/03/2011.  Findings: Motion degrades imaging at the lung bases.  4 mm pleural parenchymal right lower lobe nodule image 3 is noted.  Trace pleural effusions.  Curvilinear bilateral lower lobe presumed atelectasis or scarring.  Trace perihepatic fluid is identified tracking along the pericolic gutter to the pelvis.  Gallstones are re-identified.  There is portal venous thrombosis with extension to the superior mesenteric vein and surrounding perivenous stranding.  For example, see image 40. Celiac axis, superior mesenteric artery, and IMA are patent allowing for technique.  The splenic vein is patent up to the portosplenic confluence.  There is moderate right hydroureteronephrosis with ureteral decompression as it crosses over the iliac vessels, for example image 74.  No extrinsic or intrinsic mass is identified allowing for technique.  No radiopaque renal, ureteral, or bladder calculus. Renal enhancement is normal with excretion into the collecting systems.  No perinephric stranding or fluid.  There is an ill-defined 1.2 cm hypodense lesion within the head of the pancreas image 41.  No specific peripancreatic stranding separate from that seen adjacent to the portal vein.  5mm similar hypodense lesion body of pancreas. No pancreatic  dilatation.  Spleen and adrenal glands are normal.  Too small to characterize upper renal pole cortical hypodensities noted with left mid renal cortical 2.9 cm cyst.  Uterus demonstrate calcifications which could be related to involuted fibroid or vascular calcifications. The ovaries are normal.  No lymphadenopathy or free air.  No bowel wall thickening or focal segmental dilatation.  No acute osseous finding. Degenerative changes are noted in the spine.  Lower thoracic mild compression deformities are stable.  IMPRESSION: Addition of multiple complex findings as above.  The most acute appearing finding is the portal venous thrombosis with surrounding stranding and extension to the superior mesenteric vein, without evidence for bowel compromise. Occult bowel ischemia or enteritis may not be apparent on CT.  Right moderate hydroureteronephrosis is noted without other complicating features such as calculus; there was mild hydronephrosis described on the remote prior exam and this could represent stricture or other longstanding finding but an intrinsic mass or nonradiopaque calculus could have this appearance.  Hypodense pancreatic lesions which are incompletely characterized. The patient reportedly has no evidence for pancreatitis, for which there could also be association with portal venous thrombosis.  Gallstones without other evidence for acute cholecystitis.  Dedicated non emergent pancreatic and renal imaging are recommended, with modalities tailored to the patient's current course of treatment and clinical condition.  These results were called by telephone on 10/01/2012 at 11:20 p.m. to Dr. Trellis Moment, who verbally acknowledged these results.   Original Report Authenticated By: Christiana Pellant, M.D.     Assessment/Plan Principal Problem:   Abdominal pain Active Problems:   DM   HYPERTENSION   Unspecified hypothyroidism   Portal vein thrombosis   Hydronephrosis   1. Portal vein thrombosis - I have  discussed with on-call gastroenterologist Dr. Leone Payor. Dr. Leone Payor has advised to start patient on heparin. Patient will be kept n.p.o. with gentle hydration and hold Lasix. Repeat labs including lactic acid levels. Further recommendations per gastroenterologist. Continue with pain relief medications. 2. Right-sided hydronephrosis - consult urologist in a.m. 3. Pancreatic lesion seen in CT scan - further recommendations per GI. 4. Anemia - closely follow CBC. 5. Diabetes mellitus 2 - since patient n.p.o. and patient stated that last 2-3 days her sugars have been running below 70 I have decreased her Lantus to 10 units. Closely follow CBGs 6. Hypothyroidism - Synthroid continue as iv. 7. Hypertension - when necessary IV hydralazine for systolic blood pressure 160.    Code Status: Full code.  Family Communication: None.  Disposition Plan: Admit to inpatient.    Warnie Belair N. [facility] Pager [phone].  If 7PM-7AM, please contact night-coverage [email or web address] Password [facility] 10/02/2012, 3:43 AM

## 2012-10-02 NOTE — Progress Notes (Signed)
Utilization review complete. Isidoro Donning RN CCM Case Management [phone]

## 2012-10-02 NOTE — Telephone Encounter (Signed)
Gavin Pound, pts daughter left v/m pt was admitted to [facility] with lesion on pancreas, blood clot in portal vein,gall stones with inflammation and swelling in the kidney. pts abdomen is very distended. Clydie Braun wanted Dr Dayton Martes to be aware and Clydie Braun does not require cb but if Dr Dayton Martes wants to contact Clydie Braun call [phone].

## 2012-10-02 NOTE — Telephone Encounter (Signed)
Spoke to Lillia Abed (granddaughter) and was advised that patient has been admitted to the hospital with a blood clot and several other issues going on.

## 2012-10-02 NOTE — ED Notes (Signed)
Downtime charting completed. Please see appropriate documents.

## 2012-10-02 NOTE — Telephone Encounter (Signed)
Noted. Will route to PCP as fyi.

## 2012-10-02 NOTE — Progress Notes (Signed)
ANTICOAGULATION CONSULT NOTE - Follow up Consult  Pharmacy Consult for Heparin Indication: Portal vein thrombosis  Allergies  Allergen Reactions  . Oxycodone-Acetaminophen Nausea Only  . Zithromax [Azithromycin]     thrush    Patient Measurements: Height: 5' 6.53" (169 cm) Weight: 235 lb 14.3 oz (107 kg) IBW/kg (Calculated) : 60.53 Heparin Dosing Weight: 85 kg  Vital Signs: Temp: 98.4 F (36.9 C) (08/13 0556) BP: 121/46 mmHg (08/13 0556) Pulse Rate: 69 (08/13 0556)  Labs:  Recent Labs  10/01/12 1705 10/01/12 2351 10/02/12 0515 10/02/12 1236  HGB 10.7*  --  9.2*  --   HCT 32.6*  --  28.6*  --   PLT 220  --  209  --   APTT  --  35  --   --   HEPARINUNFRC  --   --   --  0.16*  CREATININE 0.71  --  0.73  --     Estimated Creatinine Clearance: 79.4 ml/min (by C-G formula based on Cr of 0.73).   Assessment: 72 yo female with abdominal pain found to have portal vein thrombosis and is on IV heparin per pharmacy. Initial level after a 3000 unit bolus and going at rate of 1300 units/hr was low at 0.16. Per RN, she changed the dressing this morning and thought it may have had some leaking, but that would not make the level this below goal and it has not been noted since. Hgb has dropped since admission- no bleeding noted, plts stable and WNL.  Goal of Therapy:  Heparin level 0.3-0.7 units/ml Monitor platelets by anticoagulation protocol: Yes   Plan:  1. Heparin 2500 units IV bolus 2. Increase heparin gtt to 1500 units/hr 3. Check heparin level in 8 hours 4. Daily HL and CBC 5. Follow up long term anticoag plans and hospital course  Adanna Zuckerman D. Omarri Eich, PharmD Clinical Pharmacist Pager: [phone] 10/02/2012 1:19 PM

## 2012-10-03 ENCOUNTER — Encounter (HOSPITAL_COMMUNITY): Payer: Self-pay | Admitting: Radiology

## 2012-10-03 ENCOUNTER — Inpatient Hospital Stay (HOSPITAL_COMMUNITY): Payer: 59

## 2012-10-03 DIAGNOSIS — C259 Malignant neoplasm of pancreas, unspecified: Secondary | ICD-10-CM

## 2012-10-03 DIAGNOSIS — K869 Disease of pancreas, unspecified: Secondary | ICD-10-CM

## 2012-10-03 LAB — PROTIME-INR: INR: 1.06 (ref 0.00–1.49)

## 2012-10-03 LAB — CANCER ANTIGEN 19-9: CA 19-9: 527.5 U/mL — ABNORMAL HIGH (ref <35.0)

## 2012-10-03 LAB — CBC
HCT: 29.6 % — ABNORMAL LOW (ref 36.0–46.0)
Hemoglobin: 9.5 g/dL — ABNORMAL LOW (ref 12.0–15.0)
MCV: 84.8 fL (ref 78.0–100.0)
Platelets: 202 10*3/uL (ref 150–400)
RBC: 3.49 MIL/uL — ABNORMAL LOW (ref 3.87–5.11)
WBC: 5.8 10*3/uL (ref 4.0–10.5)

## 2012-10-03 LAB — IRON AND TIBC
Saturation Ratios: 6 % — ABNORMAL LOW (ref 20–55)
TIBC: 227 ug/dL — ABNORMAL LOW (ref 250–470)
UIBC: 213 ug/dL (ref 125–400)

## 2012-10-03 LAB — BASIC METABOLIC PANEL
CO2: 26 mEq/L (ref 19–32)
Chloride: 108 mEq/L (ref 96–112)
Creatinine, Ser: 0.62 mg/dL (ref 0.50–1.10)
Glucose, Bld: 122 mg/dL — ABNORMAL HIGH (ref 70–99)

## 2012-10-03 LAB — VITAMIN B12: Vitamin B-12: 472 pg/mL (ref 211–911)

## 2012-10-03 LAB — GLUCOSE, CAPILLARY
Glucose-Capillary: 102 mg/dL — ABNORMAL HIGH (ref 70–99)
Glucose-Capillary: 105 mg/dL — ABNORMAL HIGH (ref 70–99)
Glucose-Capillary: 113 mg/dL — ABNORMAL HIGH (ref 70–99)
Glucose-Capillary: 143 mg/dL — ABNORMAL HIGH (ref 70–99)
Glucose-Capillary: 148 mg/dL — ABNORMAL HIGH (ref 70–99)

## 2012-10-03 LAB — OCCULT BLOOD X 1 CARD TO LAB, STOOL: Fecal Occult Bld: NEGATIVE

## 2012-10-03 LAB — RETICULOCYTES: Retic Ct Pct: 0.8 % (ref 0.4–3.1)

## 2012-10-03 MED ORDER — WARFARIN SODIUM 5 MG PO TABS
5.0000 mg | ORAL_TABLET | Freq: Once | ORAL | Status: DC
  Filled 2012-10-03: qty 1

## 2012-10-03 MED ORDER — MAGNESIUM HYDROXIDE 400 MG/5ML PO SUSP: 30.0000 mL | Freq: Every day | ORAL | Status: DC | PRN

## 2012-10-03 MED ORDER — WARFARIN - PHARMACIST DOSING INPATIENT: Freq: Every day | Status: DC

## 2012-10-03 MED ORDER — IOHEXOL 350 MG/ML SOLN
100.0000 mL | Freq: Once | INTRAVENOUS | Status: AC | PRN
  Administered 2012-10-03: 100 mL via INTRAVENOUS

## 2012-10-03 MED ORDER — HEPARIN BOLUS VIA INFUSION
2500.0000 [IU] | Freq: Once | INTRAVENOUS | Status: AC
  Administered 2012-10-03: 2500 [IU] via INTRAVENOUS
  Filled 2012-10-03: qty 2500

## 2012-10-03 MED ORDER — HEPARIN (PORCINE) IN NACL 100-0.45 UNIT/ML-% IJ SOLN
2350.0000 [IU]/h | INTRAMUSCULAR | Status: DC
  Administered 2012-10-04: 1950 [IU]/h via INTRAVENOUS
  Administered 2012-10-04: 1650 [IU]/h via INTRAVENOUS
  Administered 2012-10-04: 1950 [IU]/h via INTRAVENOUS
  Administered 2012-10-05: 2150 [IU]/h via INTRAVENOUS
  Administered 2012-10-06: 2350 [IU]/h via INTRAVENOUS
  Filled 2012-10-03 (×11): qty 250

## 2012-10-03 NOTE — Progress Notes (Signed)
[facility] PROGRESS NOTE  Theresa Summers UJW:[id number] DOB: 12-30-1940 DOA: 10/01/2012 PCP: Ruthe Mannan, MD  Assessment/Plan: Principal Problem:   Abdominal pain Active Problems:   DM   HYPERTENSION   Unspecified hypothyroidism   Portal vein thrombosis   Hydronephrosis   Iron deficiency anemia, unspecified   Nonspecific (abnormal) findings on radiological and other examination of biliary tract    1. Portal vein thrombosis: Patient presented with progressive abdominal pain over 10 days, and CT Abdomen/Pelvis demonstrated portal venous thrombosis with surrounding stranding and extension to the superior mesenteric vein, without evidence for bowel compromise. Initial discussion was held with Dr. Stan Head, GI, who recommended ivi Heparin infusion. Managing as recommended. Patient was placed on bowel rest, ivi fluids, and analgesics. Dr Lina Sar provided GI consultaion, and diet was advanced to full liquids. Tolerated. Abdominal pain is much less today.   2. Pancreatic lesion seen in CT scan: Yet another incidental finding. Etiology is indeterminate, but GI ordered Ca19-9 tumor marker, which is markedly elevated at 527.5. further recommendations per GI. Further work-up per GI, but it seems MRI is impracticable due to severe claustrophobia. CT abdomen with pancreatic protocol, is scheduled for 10/03/12. Patient may yet need EUS/Biopsy.  3. Right-sided hydronephrosis: This was an incidental finding on CT scan, which revealed moderate right hydroureteronephrosis, without other complicating features such as calculus. There was mild hydronephrosis described on the remote prior exam. Patient's creatinine is normal at 0.73, and urinalysis is negative. Have consulted Dr Brunilda Payor, urologist on 10/03/12. 4. Anemia: This is mild, normocytic. Anemia panel indicated iron deficiency. Following CBC. We shall check FOBT. 5. Diabetes mellitus 2: This is insulin-requiring type 2. CBGs have remained satisfactory  since admission, so managing with SSI/Low dose Lantus. Will adjust as indicated.  6. Hypothyroidism: On Thyroxine replacement Therapy.  7. Hypertension: Reasonably controlled on prn Hydralazine.   Code Status: Full Code.  Family Communication:  Disposition Plan: To be determined.    Brief narrative: 72 y.o. female with history of diabetes mellitus type 2, hypertension, hyperlipidemia, hypothyroidism, GERD, presenting to the ER with complaints of abdominal pain over the last 10 days, initially in the epigastric area, then became more diffuse. Pain is present even without eating and eating increases the pain. Patient had one episode nausea and vomiting. Denies any diarrhea and has had regular bowel movement. In the ER CT Abdomen/Pelvis shows features concerning for portal vein thrombosis and right-sided hydronephrosis. Patient was admitted for further management.   Consultants:  GI.   Procedures:  CT Abdomen/Pelvis.   Antibiotics:  N/A.   HPI/Subjective: Feels much better.   Objective: Vital signs in last 24 hours: Temp:  [98.7 F (37.1 C)-98.8 F (37.1 C)] 98.8 F (37.1 C) (08/14 0518) Pulse Rate:  [69-76] 75 (08/14 0518) Resp:  [18] 18 (08/14 0518) BP: (145-152)/(58-62) 145/58 mmHg (08/14 0518) SpO2:  [97 %-98 %] 98 % (08/14 0518) Weight:  [107.5 kg (236 lb 15.9 oz)] 107.5 kg (236 lb 15.9 oz) (08/14 0518) Weight change: 0.5 kg (1 lb 1.6 oz) Last BM Date: 10/01/12  Intake/Output from previous day: 08/13 0701 - 08/14 0700 In: 1440 [P.O.:240; I.V.:1200] Out: -      Physical Exam: General: Comfortable, alert, communicative, fully oriented, not short of breath at rest.  HEENT:  Mild clinical pallor, no jaundice, no conjunctival injection or discharge. NECK:  Supple, JVP not seen, no carotid bruits, no palpable lymphadenopathy, no palpable goiter. CHEST:  Clinically clear to auscultation, no wheezes, no crackles. HEART:  Sounds 1 and 2 heard, normal, regular, no  murmurs. ABDOMEN:  Full, soft, non-tender, only mild generalized discomfort, no palpable organomegaly, no palpable masses, normal bowel sounds. GENITALIA:  Not examined. LOWER EXTREMITIES:  No pitting edema, palpable peripheral pulses. MUSCULOSKELETAL SYSTEM:  Generalized osteoarthritic changes, otherwise, normal. CENTRAL NERVOUS SYSTEM:  No focal neurologic deficit on gross examination.  Lab Results:  Recent Labs  10/02/12 0515 10/03/12 0620  WBC 6.4 5.8  HGB 9.2* 9.5*  HCT 28.6* 29.6*  PLT 209 202    Recent Labs  10/02/12 0515 10/03/12 0620  NA 142 142  K 3.3* 3.8  CL 105 108  CO2 29 26  GLUCOSE 80 122*  BUN 7 6  CREATININE 0.73 0.62  CALCIUM 8.9 9.0   No results found for this or any previous visit (from the past 240 hour(s)).   Studies/Results: Dg Chest 2 View  10/01/2012   *RADIOLOGY REPORT*  Clinical Data: Epigastric pain  CHEST - 2 VIEW  Comparison: Radiograph 11/21 1011  Findings: Normal cardiac silhouette.  There is mild scarring in the lingula unchanged from prior.  No effusion, infiltrate, or pneumothorax. Degenerative osteophytosis of the thoracic spine.  IMPRESSION: No acute cardiopulmonary process.   Original Report Authenticated By: Genevive Bi, M.D.   US Abdomen Complete  10/01/2012   *RADIOLOGY REPORT*  Clinical Data:  Abdominal pain, fatigue and right flank pain  COMPLETE ABDOMINAL ULTRASOUND  Comparison:  None.  Findings:  Gallbladder:  There are multiple dependent gallstones in a distended gallbladder.  There is no wall thickening or pericholecystic fluid.  There is no evidence of acute cholecystitis.  Common bile duct:  The common bile duct is mildly dilated measuring just over 8 mm.  No duct stone is seen.  The distal duct is not well visualized.  Liver:  The liver is echogenic consistent with diffuse fatty infiltration.  It is also somewhat heterogeneous in echogenicity, but no discrete mass or focal lesion is seen.  IVC:  Appears normal.  Pancreas:   Limited visualization.  Portions of the distal tail and inferior head were obscured by bowel gas.  The portions seen are echogenic but otherwise unremarkable.  Spleen:  Normal in size measuring 8.9 cm.  Right Kidney:  Moderate right hydronephrosis with dilated of the proximal right ureter.  No right renal mass or stone. Right kidney measures 13.5 cm in length.  Left Kidney:  No hydronephrosis.  Cyst arises from the mid pole measuring 3 cm.  No other renal masses.  Left kidney measures 12.1 cm in length.  Abdominal aorta:  Aorta not well seen due to overlying bowel gas.  Bladder was imaged to evaluate for ureteral jets.  Ureteral jets were visualized.  IMPRESSION: Multiple gallstones but no evidence of acute cholecystitis.  Mild dilation of the common bile duct.  This most likely is chronic.  Consider a distal duct stone if there are symptoms consistent with this.  The distal duct was not visualized on the study.  Moderate right hydronephrosis.  The etiology of this is unclear. This would be better evaluated with abdominal pelvic CT.  Fatty infiltration of the liver.   Original Report Authenticated By: Amie Portland, M.D.   Ct Abdomen Pelvis W Contrast  10/01/2012   *RADIOLOGY REPORT*  Clinical Data: Abdominal pain, right flank pain  CT ABDOMEN AND PELVIS WITH CONTRAST  Technique:  Multidetector CT imaging of the abdomen and pelvis was performed following the standard protocol during bolus administration of intravenous contrast.  Contrast:  OMNIPAQUE IOHEXOL 300 MG/ML  SOLN  Comparison: Abdominal ultrasound 10/01/2012, report of prior CT 11/17/1997 images not digitized, lumbar spine radiographs 11/03/2011.  Findings: Motion degrades imaging at the lung bases.  4 mm pleural parenchymal right lower lobe nodule image 3 is noted.  Trace pleural effusions.  Curvilinear bilateral lower lobe presumed atelectasis or scarring.  Trace perihepatic fluid is identified tracking along the pericolic gutter to the pelvis.   Gallstones are re-identified.  There is portal venous thrombosis with extension to the superior mesenteric vein and surrounding perivenous stranding.  For example, see image 40. Celiac axis, superior mesenteric artery, and IMA are patent allowing for technique.  The splenic vein is patent up to the portosplenic confluence.  There is moderate right hydroureteronephrosis with ureteral decompression as it crosses over the iliac vessels, for example image 74.  No extrinsic or intrinsic mass is identified allowing for technique.  No radiopaque renal, ureteral, or bladder calculus. Renal enhancement is normal with excretion into the collecting systems.  No perinephric stranding or fluid.  There is an ill-defined 1.2 cm hypodense lesion within the head of the pancreas image 41.  No specific peripancreatic stranding separate from that seen adjacent to the portal vein.  5mm similar hypodense lesion body of pancreas. No pancreatic dilatation.  Spleen and adrenal glands are normal.  Too small to characterize upper renal pole cortical hypodensities noted with left mid renal cortical 2.9 cm cyst.  Uterus demonstrate calcifications which could be related to involuted fibroid or vascular calcifications. The ovaries are normal.  No lymphadenopathy or free air.  No bowel wall thickening or focal segmental dilatation.  No acute osseous finding. Degenerative changes are noted in the spine.  Lower thoracic mild compression deformities are stable.  IMPRESSION: Addition of multiple complex findings as above.  The most acute appearing finding is the portal venous thrombosis with surrounding stranding and extension to the superior mesenteric vein, without evidence for bowel compromise. Occult bowel ischemia or enteritis may not be apparent on CT.  Right moderate hydroureteronephrosis is noted without other complicating features such as calculus; there was mild hydronephrosis described on the remote prior exam and this could represent  stricture or other longstanding finding but an intrinsic mass or nonradiopaque calculus could have this appearance.  Hypodense pancreatic lesions which are incompletely characterized. The patient reportedly has no evidence for pancreatitis, for which there could also be association with portal venous thrombosis.  Gallstones without other evidence for acute cholecystitis.  Dedicated non emergent pancreatic and renal imaging are recommended, with modalities tailored to the patient's current course of treatment and clinical condition.  These results were called by telephone on 10/01/2012 at 11:20 p.m. to Dr. Trellis Moment, who verbally acknowledged these results.   Original Report Authenticated By: Christiana Pellant, M.D.    Medications: Scheduled Meds: . fesoterodine  4 mg Oral QHS  . insulin aspart  0-9 Units Subcutaneous TID WC  . insulin glargine  10 Units Subcutaneous QHS  . levothyroxine  44 mcg Intravenous Daily  . pantoprazole  40 mg Oral Q0600  . sodium chloride  3 mL Intravenous Q12H  . traZODone  50 mg Oral QHS   Continuous Infusions: . heparin 1,500 Units/hr (10/02/12 2029)   PRN Meds:.acetaminophen, acetaminophen, hydrALAZINE, morphine injection, ondansetron (ZOFRAN) IV, ondansetron    LOS: 2 days   Benicia Bergevin,CHRISTOPHER  [facility] Pager [phone]. If 8PM-8AM, please contact night-coverage at [email or web address], password [facility] 10/03/2012, 9:42 AM  LOS: 2 days

## 2012-10-03 NOTE — Progress Notes (Signed)
Met with Mrs Grau at bedside on behalf of [employer] [employer] program for [employer] employees with [employer]. She is currently active with the [employer] [facility] [employer] program for DM management. She sees the [facility] [employer] pharmacist on a regular basis. Left contact information with Mrs Jergens and confirmed her best contact numbers for post hospital discharge call.  Raiford Noble, MSN-Ed, RN,BSN- [facility] [phone]

## 2012-10-03 NOTE — Progress Notes (Signed)
ANTICOAGULATION CONSULT NOTE - Follow up Consult  Pharmacy Consult for Heparin Indication: Portal vein thrombosis  Allergies  Allergen Reactions  . Oxycodone-Acetaminophen Nausea Only  . Zithromax [Azithromycin]     thrush    Patient Measurements: Height: 5' 6.53" (169 cm) Weight: 236 lb 15.9 oz (107.5 kg) IBW/kg (Calculated) : 60.53 Heparin Dosing Weight: 85 kg  Vital Signs: Temp: 98.8 F (37.1 C) (08/14 0518) Temp src: Oral (08/14 0518) BP: 145/58 mmHg (08/14 0518) Pulse Rate: 75 (08/14 0518)  Labs:  Recent Labs  10/01/12 1705 10/01/12 2351 10/02/12 0515 10/02/12 1236 10/03/12 0620  HGB 10.7*  --  9.2*  --  9.5*  HCT 32.6*  --  28.6*  --  29.6*  PLT 220  --  209  --  202  APTT  --  35  --   --   --   HEPARINUNFRC  --   --   --  0.16* <0.10*  CREATININE 0.71  --  0.73  --  0.62    Estimated Creatinine Clearance: 79.6 ml/min (by C-G formula based on Cr of 0.62).   Assessment: 72 yo female with abdominal pain found to have portal vein thrombosis and is on IV heparin per pharmacy. Initial level was low and she was rebolused and the rate was increased. Last night, IV access was lost and the heparin was off for about 2.5 hours, so the PM level was retimed for this morning with AM labs. This level was undetectable. Spoke with the RN and there have been no issues with IV access and no bleeding noted. Her CBC did drop from admission, but has been relatively stable now.  Goal of Therapy:  Heparin level 0.3-0.7 units/ml Monitor platelets by anticoagulation protocol: Yes   Plan:  1. Heparin 2500 units IV bolus 2. Increase heparin gtt to 1650 units/hr 3. Check heparin level in 8 hours 4. Daily HL and CBC 5. Follow up long term anticoag plans and hospital course  Amias Hutchinson D. Albertine Lafoy, PharmD Clinical Pharmacist Pager: [phone] 10/03/2012 10:30 AM

## 2012-10-03 NOTE — Consult Note (Signed)
Urology Consult  Referring physician: Dr. Ricke Hey Reason for referral: Right hydronephrosis  Chief Complaint: Abdominal pain  History of Present Illness: Patient is a 72 years old female who presented to the emergency room on 08/13 complaining off for abdominal pain for the past 10 days. The pain started in the epigastric area and then became more diffuse. Fluid intake increases the pain. She had one episode of nausea and vomiting. CT scan of the abdomen and pelvis showed a findings suggestive of portal vein thrombosis and moderate right-sided hydronephrosis. She has lumbar pain. She does not have any flank pain. She denies any voiding symptoms. She has a past history of kidney stone. In 1999 she had a mild right hydronephrosis secondary to a small right distal ureteral calculus. She has had 3 stones in the past and has always passed the stones. The CT scan did not show any definite evidence of the cause of the obstruction. She has a history of diabetes, hypertension, hyperlipidemia, hypothyroidism. She is currently on heparin. According to the patient the plan is to discharge her home on Coumadin. Urinalysis shows no RBCs or WBCs.  Past Medical History  Diagnosis Date  . Trigger finger   . Hyperlipidemia   . Diabetes mellitus   . Chronic venous insufficiency   . Morbid obesity   . Hypertension   . Thyroid disease   . GERD (gastroesophageal reflux disease)   . Hypothyroidism   . Arthritis   . Anemia    Past Surgical History  Procedure Laterality Date  . Lumbar laminectomy    . Breast biopsy      Medications: Acetaminophen, biotin, doxazosin, Lexapro, Pepcid,Toviaz, Lasix, Lantus, Synthroid, Victoza, Claritin, Cozaar, Glucophage, Protonix, Klor-Con, Lyrica, Zocor, trazodone Allergies:  Allergies  Allergen Reactions  . Oxycodone-Acetaminophen Nausea Only  . Zithromax [Azithromycin]     thrush    Family History  Problem Relation Age of Onset  . Heart disease Father   .  Arthritis Father   . Breast cancer Mother   . Lymphoma Sister     #1  . Diabetes Sister     #1  . Diabetes Sister     #2  . Diabetes Brother     #1  . Diabetes Brother     #2  . Diabetes Other     siblings  . Hypertension Other     siblings  . Arthritis Other     siblings   Social History:  reports that she has never smoked. She has never used smokeless tobacco. She reports that she does not drink alcohol or use illicit drugs.  ROS: All systems are reviewed and negative except as noted.   Physical Exam:  Vital signs in last 24 hours: Temp:  [97.7 F (36.5 C)-99.4 F (37.4 C)] 99.4 F (37.4 C) (08/14 2048) Pulse Rate:  [69-75] 69 (08/14 2048) Resp:  [17-18] 18 (08/14 2048) BP: (145-148)/(58-78) 147/75 mmHg (08/14 2048) SpO2:  [98 %] 98 % (08/14 2048) Weight:  [107.5 kg (236 lb 15.9 oz)] 107.5 kg (236 lb 15.9 oz) (08/14 0518)  Cardiovascular: Skin warm; not flushed Respiratory: Breaths quiet; no shortness of breath Abdomen: No masses Neurological: Normal sensation to touch Musculoskeletal: Normal motor function arms and legs Lymphatics: No inguinal adenopathy Skin: No rashes   Laboratory Data:  Results for orders placed during the hospital encounter of 10/01/12 (from the past 72 hour(s))  CBC WITH DIFFERENTIAL     Status: Abnormal   Collection Time    10/01/12  5:05 PM      Result Value Range   WBC 6.5  4.0 - 10.5 K/uL   RBC 3.84 (*) 3.87 - 5.11 MIL/uL   Hemoglobin 10.7 (*) 12.0 - 15.0 g/dL   HCT 16.1 (*) 09.6 - 04.5 %   MCV 84.9  78.0 - 100.0 fL   MCH 27.9  26.0 - 34.0 pg   MCHC 32.8  30.0 - 36.0 g/dL   RDW 40.9  81.1 - 91.4 %   Platelets 220  150 - 400 K/uL   Neutrophils Relative % 71  43 - 77 %   Neutro Abs 4.6  1.7 - 7.7 K/uL   Lymphocytes Relative 17  12 - 46 %   Lymphs Abs 1.1  0.7 - 4.0 K/uL   Monocytes Relative 10  3 - 12 %   Monocytes Absolute 0.7  0.1 - 1.0 K/uL   Eosinophils Relative 2  0 - 5 %   Eosinophils Absolute 0.1  0.0 - 0.7 K/uL    Basophils Relative 1  0 - 1 %   Basophils Absolute 0.0  0.0 - 0.1 K/uL  COMPREHENSIVE METABOLIC PANEL     Status: Abnormal   Collection Time    10/01/12  5:05 PM      Result Value Range   Sodium 141  135 - 145 mEq/L   Potassium 3.7  3.5 - 5.1 mEq/L   Chloride 102  96 - 112 mEq/L   CO2 29  19 - 32 mEq/L   Glucose, Bld 169 (*) 70 - 99 mg/dL   BUN 9  6 - 23 mg/dL   Creatinine, Ser 7.82  0.50 - 1.10 mg/dL   Calcium 9.6  8.4 - 95.6 mg/dL   Total Protein 7.1  6.0 - 8.3 g/dL   Albumin 3.6  3.5 - 5.2 g/dL   AST 21  0 - 37 U/L   ALT 14  0 - 35 U/L   Alkaline Phosphatase 56  39 - 117 U/L   Total Bilirubin 0.2 (*) 0.3 - 1.2 mg/dL   GFR calc non Af Amer 84 (*) >90 mL/min   GFR calc Af Amer >90  >90 mL/min   Comment:            The eGFR has been calculated     using the CKD EPI equation.     This calculation has not been     validated in all clinical     situations.     eGFR's persistently     <90 mL/min signify     possible Chronic Kidney Disease.  LIPASE, BLOOD     Status: None   Collection Time    10/01/12  5:05 PM      Result Value Range   Lipase 56  11 - 59 U/L  POCT I-STAT TROPONIN I     Status: None   Collection Time    10/01/12  5:11 PM      Result Value Range   Troponin i, poc 0.01  0.00 - 0.08 ng/mL   Comment 3            Comment: Due to the release kinetics of cTnI,     a negative result within the first hours     of the onset of symptoms does not rule out     myocardial infarction with certainty.     If myocardial infarction is still suspected,     repeat the test at appropriate intervals.  URINALYSIS,  ROUTINE W REFLEX MICROSCOPIC     Status: None   Collection Time    10/01/12  8:13 PM      Result Value Range   Color, Urine YELLOW  YELLOW   APPearance CLEAR  CLEAR   Specific Gravity, Urine 1.010  1.005 - 1.030   pH 6.5  5.0 - 8.0   Glucose, UA NEGATIVE  NEGATIVE mg/dL   Hgb urine dipstick NEGATIVE  NEGATIVE   Bilirubin Urine NEGATIVE  NEGATIVE   Ketones, ur  NEGATIVE  NEGATIVE mg/dL   Protein, ur NEGATIVE  NEGATIVE mg/dL   Urobilinogen, UA 1.0  0.0 - 1.0 mg/dL   Nitrite NEGATIVE  NEGATIVE   Leukocytes, UA NEGATIVE  NEGATIVE   Comment: MICROSCOPIC NOT DONE ON URINES WITH NEGATIVE PROTEIN, BLOOD, LEUKOCYTES, NITRITE, OR GLUCOSE <1000 mg/dL.  CG4 I-STAT (LACTIC ACID)     Status: None   Collection Time    10/01/12 10:59 PM      Result Value Range   Lactic Acid, Venous 0.61  0.5 - 2.2 mmol/L  APTT     Status: None   Collection Time    10/01/12 11:51 PM      Result Value Range   aPTT 35  24 - 37 seconds  GLUCOSE, CAPILLARY     Status: None   Collection Time    10/02/12  2:10 AM      Result Value Range   Glucose-Capillary 72  70 - 99 mg/dL  LIPASE, BLOOD     Status: None   Collection Time    10/02/12  5:15 AM      Result Value Range   Lipase 33  11 - 59 U/L  LACTIC ACID, PLASMA     Status: None   Collection Time    10/02/12  5:15 AM      Result Value Range   Lactic Acid, Venous 0.5  0.5 - 2.2 mmol/L  COMPREHENSIVE METABOLIC PANEL     Status: Abnormal   Collection Time    10/02/12  5:15 AM      Result Value Range   Sodium 142  135 - 145 mEq/L   Potassium 3.3 (*) 3.5 - 5.1 mEq/L   Chloride 105  96 - 112 mEq/L   CO2 29  19 - 32 mEq/L   Glucose, Bld 80  70 - 99 mg/dL   BUN 7  6 - 23 mg/dL   Creatinine, Ser 7.84  0.50 - 1.10 mg/dL   Calcium 8.9  8.4 - 69.6 mg/dL   Total Protein 5.9 (*) 6.0 - 8.3 g/dL   Albumin 3.0 (*) 3.5 - 5.2 g/dL   AST 19  0 - 37 U/L   ALT 11  0 - 35 U/L   Alkaline Phosphatase 44  39 - 117 U/L   Total Bilirubin 0.3  0.3 - 1.2 mg/dL   GFR calc non Af Amer 83 (*) >90 mL/min   GFR calc Af Amer >90  >90 mL/min   Comment:            The eGFR has been calculated     using the CKD EPI equation.     This calculation has not been     validated in all clinical     situations.     eGFR's persistently     <90 mL/min signify     possible Chronic Kidney Disease.  CBC WITH DIFFERENTIAL     Status: Abnormal    Collection Time  10/02/12  5:15 AM      Result Value Range   WBC 6.4  4.0 - 10.5 K/uL   RBC 3.38 (*) 3.87 - 5.11 MIL/uL   Hemoglobin 9.2 (*) 12.0 - 15.0 g/dL   HCT 16.1 (*) 09.6 - 04.5 %   MCV 84.6  78.0 - 100.0 fL   MCH 27.2  26.0 - 34.0 pg   MCHC 32.2  30.0 - 36.0 g/dL   RDW 40.9  81.1 - 91.4 %   Platelets 209  150 - 400 K/uL   Neutrophils Relative % 55  43 - 77 %   Neutro Abs 3.5  1.7 - 7.7 K/uL   Lymphocytes Relative 27  12 - 46 %   Lymphs Abs 1.7  0.7 - 4.0 K/uL   Monocytes Relative 14 (*) 3 - 12 %   Monocytes Absolute 0.9  0.1 - 1.0 K/uL   Eosinophils Relative 3  0 - 5 %   Eosinophils Absolute 0.2  0.0 - 0.7 K/uL   Basophils Relative 1  0 - 1 %   Basophils Absolute 0.1  0.0 - 0.1 K/uL  GLUCOSE, CAPILLARY     Status: None   Collection Time    10/02/12  6:37 AM      Result Value Range   Glucose-Capillary 74  70 - 99 mg/dL   Comment 1 Notify RN    GLUCOSE, CAPILLARY     Status: None   Collection Time    10/02/12 11:16 AM      Result Value Range   Glucose-Capillary 83  70 - 99 mg/dL   Comment 1 Notify RN    CANCER ANTIGEN 19-9     Status: Abnormal   Collection Time    10/02/12 12:30 PM      Result Value Range   CA 19-9 527.5 (*) <35.0 U/mL   Comment: Performed at [facility]  HEPARIN LEVEL (UNFRACTIONATED)     Status: Abnormal   Collection Time    10/02/12 12:36 PM      Result Value Range   Heparin Unfractionated 0.16 (*) 0.30 - 0.70 IU/mL   Comment:            IF HEPARIN RESULTS ARE BELOW     EXPECTED VALUES, AND PATIENT     DOSAGE HAS BEEN CONFIRMED,     SUGGEST FOLLOW UP TESTING     OF ANTITHROMBIN III LEVELS.  GLUCOSE, CAPILLARY     Status: Abnormal   Collection Time    10/02/12  4:26 PM      Result Value Range   Glucose-Capillary 141 (*) 70 - 99 mg/dL  GLUCOSE, CAPILLARY     Status: Abnormal   Collection Time    10/02/12  8:57 PM      Result Value Range   Glucose-Capillary 157 (*) 70 - 99 mg/dL  VITAMIN N82     Status: None   Collection  Time    10/02/12 11:50 PM      Result Value Range   Vitamin B-12 472  211 - 911 pg/mL   Comment: Performed at [facility]  FOLATE     Status: None   Collection Time    10/02/12 11:50 PM      Result Value Range   Folate 15.6     Comment: (NOTE)     Reference Ranges            Deficient:       0.4 - 3.3 ng/mL  Indeterminate:   3.4 - 5.4 ng/mL            Normal:              > 5.4 ng/mL     Performed at [facility]  IRON AND TIBC     Status: Abnormal   Collection Time    10/02/12 11:50 PM      Result Value Range   Iron 14 (*) 42 - 135 ug/dL   TIBC 595 (*) 638 - 756 ug/dL   Saturation Ratios 6 (*) 20 - 55 %   UIBC 213  125 - 400 ug/dL   Comment: Performed at [facility]  FERRITIN     Status: None   Collection Time    10/02/12 11:50 PM      Result Value Range   Ferritin 65  10 - 291 ng/mL   Comment: Performed at [facility]  RETICULOCYTES     Status: Abnormal   Collection Time    10/02/12 11:50 PM      Result Value Range   Retic Ct Pct 0.8  0.4 - 3.1 %   RBC. 3.31 (*) 3.87 - 5.11 MIL/uL   Retic Count, Manual 26.5  19.0 - 186.0 K/uL  GLUCOSE, CAPILLARY     Status: Abnormal   Collection Time    10/03/12 12:14 AM      Result Value Range   Glucose-Capillary 105 (*) 70 - 99 mg/dL  GLUCOSE, CAPILLARY     Status: Abnormal   Collection Time    10/03/12  4:59 AM      Result Value Range   Glucose-Capillary 113 (*) 70 - 99 mg/dL  HEPARIN LEVEL (UNFRACTIONATED)     Status: Abnormal   Collection Time    10/03/12  6:20 AM      Result Value Range   Heparin Unfractionated <0.10 (*) 0.30 - 0.70 IU/mL   Comment:            IF HEPARIN RESULTS ARE BELOW     EXPECTED VALUES, AND PATIENT     DOSAGE HAS BEEN CONFIRMED,     SUGGEST FOLLOW UP TESTING     OF ANTITHROMBIN III LEVELS.     REPEATED TO VERIFY  CBC     Status: Abnormal   Collection Time    10/03/12  6:20 AM      Result Value Range   WBC 5.8  4.0 - 10.5 K/uL   RBC 3.49 (*)  3.87 - 5.11 MIL/uL   Hemoglobin 9.5 (*) 12.0 - 15.0 g/dL   HCT 43.3 (*) 29.5 - 18.8 %   MCV 84.8  78.0 - 100.0 fL   MCH 27.2  26.0 - 34.0 pg   MCHC 32.1  30.0 - 36.0 g/dL   RDW 41.6  60.6 - 30.1 %   Platelets 202  150 - 400 K/uL  BASIC METABOLIC PANEL     Status: Abnormal   Collection Time    10/03/12  6:20 AM      Result Value Range   Sodium 142  135 - 145 mEq/L   Potassium 3.8  3.5 - 5.1 mEq/L   Chloride 108  96 - 112 mEq/L   CO2 26  19 - 32 mEq/L   Glucose, Bld 122 (*) 70 - 99 mg/dL   BUN 6  6 - 23 mg/dL   Creatinine, Ser 6.01  0.50 - 1.10 mg/dL   Calcium 9.0  8.4 - 09.3 mg/dL  GFR calc non Af Amer 88 (*) >90 mL/min   GFR calc Af Amer >90  >90 mL/min   Comment: (NOTE)     The eGFR has been calculated using the CKD EPI equation.     This calculation has not been validated in all clinical situations.     eGFR's persistently <90 mL/min signify possible Chronic Kidney     Disease.  GLUCOSE, CAPILLARY     Status: Abnormal   Collection Time    10/03/12  8:09 AM      Result Value Range   Glucose-Capillary 122 (*) 70 - 99 mg/dL   Comment 1 Documented in Chart     Comment 2 Notify RN    GLUCOSE, CAPILLARY     Status: Abnormal   Collection Time    10/03/12 12:30 PM      Result Value Range   Glucose-Capillary 102 (*) 70 - 99 mg/dL   Comment 1 Documented in Chart     Comment 2 Notify RN    OCCULT BLOOD X 1 CARD TO LAB, STOOL     Status: None   Collection Time    10/03/12  2:44 PM      Result Value Range   Fecal Occult Bld NEGATIVE  NEGATIVE  GLUCOSE, CAPILLARY     Status: Abnormal   Collection Time    10/03/12  4:04 PM      Result Value Range   Glucose-Capillary 143 (*) 70 - 99 mg/dL   Comment 1 Documented in Chart     Comment 2 Notify RN    GLUCOSE, CAPILLARY     Status: Abnormal   Collection Time    10/03/12  8:11 PM      Result Value Range   Glucose-Capillary 148 (*) 70 - 99 mg/dL   No results found for this or any previous visit (from the past 240  hour(s)). Creatinine:  Recent Labs  10/01/12 1705 10/02/12 0515 10/03/12 0620  CREATININE 0.71 0.73 0.62    Xrays: See report/chart   Impression/Assessment:  Moderate right hydronephrosis. The etiology of the hydronephrosis is not known at this time.  Plan:  Patient will need cystoscopy, right retrograde pyelogram, ureteroscopy for further evaluation of the hydronephrosis. The procedure can be done as an outpatient if she is ready for discharge. I think evaluation of the portal and vein thrombosis takes precedence over the evaluation of the hydronephrosis.  Theresa Summers 10/03/2012, 9:42 PM   CC: Dr. Thayer Ohm OTI         Dr. Lina Sar

## 2012-10-03 NOTE — Telephone Encounter (Signed)
Form completed, placed at front desk for pt's husband to pick up.  Copy sent for scanning.

## 2012-10-03 NOTE — Progress Notes (Signed)
Gi Daily Rounding Note 10/03/2012, 10:55 AM  SUBJECTIVE:       Pain much improved:  Less frequent and less intense.  Last Morphine was at 0400.  No nausea.  Asking about food. Diet advanced to solids, so far tolerating meat, potatoes, broccolli.  No BMs, feels like she may be constipated.  No nausea.  Fells less bloated, is passing flatus.   OBJECTIVE:         Vital signs in last 24 hours:    Temp:  [98.7 F (37.1 C)-98.8 F (37.1 C)] 98.8 F (37.1 C) (08/14 0518) Pulse Rate:  [69-76] 75 (08/14 0518) Resp:  [18] 18 (08/14 0518) BP: (145-152)/(58-62) 145/58 mmHg (08/14 0518) SpO2:  [97 %-98 %] 98 % (08/14 0518) Weight:  [107.5 kg (236 lb 15.9 oz)] 107.5 kg (236 lb 15.9 oz) (08/14 0518) Last BM Date: 10/01/12 General: comfortable, non toxic   Heart: RRR Chest: clear bil. Abdomen: obese, soft, minor epigastric/right abdominal tenderness.   Active BS  Extremities: no CCE Neuro/Psych:  somehat anxious, not agitated.  No confusion.   Intake/Output from previous day: 08/13 0701 - 08/14 0700 In: 1440 [P.O.:240; I.V.:1200] Out: -   Intake/Output this shift:    Lab Results:  Recent Labs  10/01/12 1705 10/02/12 0515 10/03/12 0620  WBC 6.5 6.4 5.8  HGB 10.7* 9.2* 9.5*  HCT 32.6* 28.6* 29.6*  PLT 220 209 202   BMET  Recent Labs  10/01/12 1705 10/02/12 0515 10/03/12 0620  NA 141 142 142  K 3.7 3.3* 3.8  CL 102 105 108  CO2 29 29 26   GLUCOSE 169* 80 122*  BUN 9 7 6   CREATININE 0.71 0.73 0.62  CALCIUM 9.6 8.9 9.0   LFT  Recent Labs  10/01/12 1705 10/02/12 0515  PROT 7.1 5.9*  ALBUMIN 3.6 3.0*  AST 21 19  ALT 14 11  ALKPHOS 56 44  BILITOT 0.2* 0.3  Lipase                         33   CA 19- 9                       527  Iron 42 - 135 ug/dL  14 (L)   TIBC 960 - 454 ug/dL  098 (L)   Saturation Ratios 20 - 55 %  6 (L)  Ferritin 10 - 291 ng/mL  65       Studies/Results: CT ANGIOGRAPHY ABDOMEN 10/03/2012 Findings: There is a stable 1.2  cm lesion in the body of the  pancreas on image 80 series 3. It is relatively hypovascular and  may simply represent a cyst but it is too small to characterize.  Stable portal vein thrombosis extending from the portal vein  confluence, occluding the entire length of the portal vein, and  extending into the superior mesenteric vein as well as some  tributaries. The splenic vein is patent. Renal veins are patent.  IVC is patent.  Aorta is nonaneurysmal and patent with mild atherosclerotic change.  Celiac axis is patent. Branch vessels are patent. Accessory left  hepatic artery.  SMA is patent with mild atherosclerotic change. Branch vessels are  patent.  IMA is patent. Branch vessels are diminutive and patent.  Single renal arteries are patent.  Diffuse hepatic steatosis  Cholelithiasis  Several small para-aortic lymph nodes are noted. None are greater  than 1 cm short axis diameter.  Stable right hydronephrosis with a slight delay in contrast  excretion from the right kidney.  There is stranding noted about the thrombosed portal vein  compatible with inflammatory changes.  Small amount of free fluid about the liver. Diffuse hepatic  steatosis.  Spleen and adrenal glands are unremarkable.  Small bilateral pleural effusions and dependent atelectasis.  Advanced degenerative changes in the lumbar spine without  compression deformity.  Review of the MIP images confirms the above findings.  IMPRESSION:  Stable portal vein thrombosis as described. There are associated inflammatory changes.  Stable 1.2 cm hypodensity in the body of the pancreas. 24-month follow-up is recommended as cystic neoplasm is not entirely excluded.  Small pleural effusions have enlarged. Small amount of free fluid is now more noticeable about the liver.  Stable cholelithiasis.  Stable right hydronephrosis and slightly delayed contrast excretion. These are secondary findings of partial right ureteral obstruction.  No  significant obstruction in the arterial vasculature.  Original Report Authenticated By: Jolaine Click, M.D.   US Abdomen Complete 10/01/2012     IMPRESSION: Multiple gallstones but no evidence of acute cholecystitis.  Mild dilation of the common bile duct.  This most likely is chronic.  Consider a distal duct stone if there are symptoms consistent with this.  The distal duct was not visualized on the study.  Moderate right hydronephrosis.  The etiology of this is unclear. This would be better evaluated with abdominal pelvic CT.  Fatty infiltration of the liver.   Original Report Authenticated By: Amie Portland, M.D.   Ct Abdomen Pelvis W Contrast 10/01/2012  .  IMPRESSION: Addition of multiple complex findings as above.  The most acute appearing finding is the portal venous thrombosis with surrounding stranding and extension to the superior mesenteric vein, without evidence for bowel compromise. Occult bowel ischemia or enteritis may not be apparent on CT.  Right moderate hydroureteronephrosis is noted without other complicating features such as calculus; there was mild hydronephrosis described on the remote prior exam and this could represent stricture or other longstanding finding but an intrinsic mass or nonradiopaque calculus could have this appearance.  Hypodense pancreatic lesions which are incompletely characterized. The patient reportedly has no evidence for pancreatitis, for which there could also be association with portal venous thrombosis.  Gallstones without other evidence for acute cholecystitis.  Dedicated non emergent pancreatic and renal imaging are recommended, with modalities tailored to the patient's current course of treatment and clinical condition.  These results were called by telephone on 10/01/2012 at 11:20 p.m. to Dr. Trellis Moment, who verbally acknowledged these results.   Original Report Authenticated By: Christiana Pellant, M.D.    ASSESMENT: * Portal venous thrombosis with associated  inflammation. Heparin initiated. Abdominal pain improved. Etiology not determined.  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 cyst.  CA 19-9 is 527: elevated.  Normal Lipase and no overt findings of pancreatitis on the CT. CT Awaiting repeat CT scan for better assessment * Gallstones without evidence of cholecystitis * Pleural effusions.  * Fatty liver.  * Right hydroureteronephrosis. Unremarkable UA.  CT suggests right ureteral obstruction.  Dr Brunilda Payor consulted.  * Normocytic anemia. Ferritin normal. Iron, TIBC and iron sats low.  Looks like anemia of chronic disease. Stool hemoccults ordered.   * IDDM, type 2 DM    PLAN: *  Urology consult called by Dr Brien Few.  *  Hematology consult? *  ? When to start Coumadin.  *  EUS?  Would likely be set up as outpt.  *  Continue carb mod diet. Can regress to clears or full liquids if this causes problems.  *  Add MOM prn.     LOS: 2 days   Jennye Moccasin  10/03/2012, 10:55 AM Pager: [phone] Attending MD note:   I have reviewed the above note,  And the Pancreatic protocol CT scan with Dr Pecolia Ades, and I have spend a considerable time with the pt and her husband. 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 Her Ca 19-9 is high in the "cancer" range. We need a tissue diagnosis ASAP. We don't see any vascular invasion by the pancreatic mass but acute portal vein thrombosis is a rare complication. We will have Dr Christella Hartigan to review the CT and set pt up for EUS and FNA ( he is out of town this week).She ought to stay on Coumadin, will ask  Pharmacy to assist in transition. She will have to come off Coumadin for the EUS. I will see her in the office next week. She is clinically much improved . If urological procedure  Recommended by Urology, it can be done while off Coumadin.  Willa [facility] Pager # [phone]

## 2012-10-04 DIAGNOSIS — D509 Iron deficiency anemia, unspecified: Secondary | ICD-10-CM

## 2012-10-04 LAB — CBC
HCT: 30.5 % — ABNORMAL LOW (ref 36.0–46.0)
MCH: 26.9 pg (ref 26.0–34.0)
MCV: 83.8 fL (ref 78.0–100.0)
RDW: 14.6 % (ref 11.5–15.5)
WBC: 8 10*3/uL (ref 4.0–10.5)

## 2012-10-04 LAB — PROTIME-INR
INR: 1.07 (ref 0.00–1.49)
Prothrombin Time: 13.7 seconds (ref 11.6–15.2)

## 2012-10-04 LAB — COMPREHENSIVE METABOLIC PANEL
Albumin: 3.3 g/dL — ABNORMAL LOW (ref 3.5–5.2)
BUN: 6 mg/dL (ref 6–23)
Calcium: 9.2 mg/dL (ref 8.4–10.5)
Chloride: 106 mEq/L (ref 96–112)
Creatinine, Ser: 0.64 mg/dL (ref 0.50–1.10)
GFR calc non Af Amer: 87 mL/min — ABNORMAL LOW (ref >90)
Total Bilirubin: 0.4 mg/dL (ref 0.3–1.2)

## 2012-10-04 LAB — GLUCOSE, CAPILLARY

## 2012-10-04 LAB — HEPARIN LEVEL (UNFRACTIONATED): Heparin Unfractionated: 0.32 IU/mL (ref 0.30–0.70)

## 2012-10-04 MED ORDER — SIMVASTATIN 20 MG PO TABS
20.0000 mg | ORAL_TABLET | Freq: Every day | ORAL | Status: DC
  Administered 2012-10-04 – 2012-10-06 (×3): 20 mg via ORAL
  Filled 2012-10-04 (×4): qty 1

## 2012-10-04 MED ORDER — DOXAZOSIN MESYLATE 8 MG PO TABS
8.0000 mg | ORAL_TABLET | Freq: Every day | ORAL | Status: DC
  Administered 2012-10-05 – 2012-10-07 (×3): 8 mg via ORAL
  Filled 2012-10-04 (×4): qty 1

## 2012-10-04 MED ORDER — FAMOTIDINE 20 MG PO TABS
20.0000 mg | ORAL_TABLET | Freq: Every day | ORAL | Status: DC | PRN
  Administered 2012-10-05: 20 mg via ORAL
  Filled 2012-10-04: qty 1

## 2012-10-04 MED ORDER — WARFARIN SODIUM 5 MG PO TABS
5.0000 mg | ORAL_TABLET | Freq: Once | ORAL | Status: AC
  Administered 2012-10-04: 5 mg via ORAL
  Filled 2012-10-04: qty 1

## 2012-10-04 MED ORDER — TRAMADOL HCL 50 MG PO TABS
50.0000 mg | ORAL_TABLET | Freq: Four times a day (QID) | ORAL | Status: DC | PRN
  Administered 2012-10-04 – 2012-10-07 (×4): 50 mg via ORAL
  Filled 2012-10-04 (×5): qty 1

## 2012-10-04 MED ORDER — ESCITALOPRAM OXALATE 20 MG PO TABS
20.0000 mg | ORAL_TABLET | Freq: Every day | ORAL | Status: DC
  Administered 2012-10-05 – 2012-10-07 (×3): 20 mg via ORAL
  Filled 2012-10-04 (×4): qty 1

## 2012-10-04 MED ORDER — FUROSEMIDE 40 MG PO TABS
40.0000 mg | ORAL_TABLET | Freq: Every day | ORAL | Status: DC
  Administered 2012-10-05 – 2012-10-06 (×2): 40 mg via ORAL
  Filled 2012-10-04 (×4): qty 1

## 2012-10-04 MED ORDER — COUMADIN BOOK
Freq: Once | Status: AC
  Administered 2012-10-04: 13:00:00
  Filled 2012-10-04: qty 1

## 2012-10-04 MED ORDER — HEPARIN BOLUS VIA INFUSION
3500.0000 [IU] | Freq: Once | INTRAVENOUS | Status: AC
  Administered 2012-10-04: 3500 [IU] via INTRAVENOUS
  Filled 2012-10-04: qty 3500

## 2012-10-04 MED ORDER — LEVOTHYROXINE SODIUM 88 MCG PO TABS
88.0000 ug | ORAL_TABLET | Freq: Every day | ORAL | Status: DC
  Administered 2012-10-05 – 2012-10-07 (×3): 88 ug via ORAL
  Filled 2012-10-04 (×4): qty 1

## 2012-10-04 MED ORDER — ALUM HYDROXIDE-MAG TRISILICATE 80-20 MG PO CHEW
2.0000 | CHEWABLE_TABLET | Freq: Four times a day (QID) | ORAL | Status: DC | PRN
  Administered 2012-10-04 – 2012-10-07 (×6): 2 via ORAL
  Filled 2012-10-04 (×6): qty 2

## 2012-10-04 MED ORDER — PREGABALIN 50 MG PO CAPS
75.0000 mg | ORAL_CAPSULE | Freq: Two times a day (BID) | ORAL | Status: DC
  Administered 2012-10-04 – 2012-10-07 (×7): 75 mg via ORAL
  Filled 2012-10-04 (×7): qty 1

## 2012-10-04 MED ORDER — INSULIN GLARGINE 100 UNIT/ML ~~LOC~~ SOLN
10.0000 [IU] | SUBCUTANEOUS | Status: DC
  Administered 2012-10-04 – 2012-10-05 (×2): 10 [IU] via SUBCUTANEOUS
  Filled 2012-10-04 (×3): qty 0.1

## 2012-10-04 MED ORDER — WARFARIN VIDEO
Freq: Once | Status: AC
  Administered 2012-10-04: 1

## 2012-10-04 MED ORDER — LORAZEPAM 1 MG PO TABS
1.0000 mg | ORAL_TABLET | Freq: Every evening | ORAL | Status: DC | PRN
  Administered 2012-10-04 – 2012-10-06 (×3): 1 mg via ORAL
  Filled 2012-10-04 (×3): qty 1

## 2012-10-04 NOTE — Progress Notes (Signed)
[facility] PROGRESS NOTE  Theresa Summers WUJ:[id number] DOB: [birth year] DOA: 10/01/2012 PCP: Ruthe Mannan, MD  Assessment/Plan: Principal Problem:   Abdominal pain Active Problems:   DM   HYPERTENSION   Unspecified hypothyroidism   Portal vein thrombosis   Hydronephrosis   Iron deficiency anemia, unspecified   Nonspecific (abnormal) findings on radiological and other examination of biliary tract   Pancreatic mass    1. Portal vein thrombosis: Patient presented with progressive abdominal pain over 10 days, and CT Abdomen/Pelvis demonstrated portal venous thrombosis with surrounding stranding and extension to the superior mesenteric vein, without evidence for bowel compromise. Initial discussion was held with Dr. Stan Head, GI, who recommended ivi Heparin infusion. Managing as recommended. Patient was placed on bowel rest, ivi fluids, and analgesics. Dr Lina Sar provided GI consultation, diet was advanced and tolerated. Coumadin was started on 10/03/12, and abdominal pain has practically resolved. Once INR is therapeutic, patient will follow up with Dr Lina Sar.  2. Pancreatic lesion seen in CT scan: Yet another incidental finding. Etiology is indeterminate, but GI ordered Ca 19-9 tumor marker, which is markedly elevated at 527.5. MRI is impracticable due to severe claustrophobia. CTA abdomen with pancreatic protocol done on 10/03/12 showed stable 1.2 cm hypodensity in the body of the pancreas, as well as a second 1.5 cm lesion in the head of the pancreas that is indeterminate. Patient will follow up with Dr Rob Bunting on discharge, for possible EUS/Biopsy.  3. Right-sided hydronephrosis: This was an incidental finding on CT scan, which revealed moderate right hydroureteronephrosis, without other complicating features such as calculus. There was mild hydronephrosis described on the remote prior exam. Patient's creatinine is normal at 0.73, and urinalysis is negative. Dr Brunilda Payor,  provided urology consultation and urologist on 10/03/12 and has opined that patient will need cystoscopy, right retrograde pyelogram, ureteroscopy for further evaluation of the hydronephrosis. The procedure can be done as an outpatient after discharge. 4. Anemia: This is mild, normocytic. Anemia panel indicated iron deficiency. Following CBC. FOBT is positive. 5. Diabetes mellitus 2: This is insulin-requiring type 2. CBGs have remained satisfactory since admission, so managing with SSI/Lantus. Adjusting as indicated.  6. Hypothyroidism: On Thyroxine replacement Therapy.  7. Hypertension: Reasonably controlled on prn Hydralazine.   Code Status: Full Code.  Family Communication:  Disposition Plan: To be determined.    Brief narrative: 72 y.o. female with history of diabetes mellitus type 2, hypertension, hyperlipidemia, hypothyroidism, GERD, presenting to the ER with complaints of abdominal pain over the last 10 days, initially in the epigastric area, then became more diffuse. Pain is present even without eating and eating increases the pain. Patient had one episode nausea and vomiting. Denies any diarrhea and has had regular bowel movement. In the ER CT Abdomen/Pelvis shows features concerning for portal vein thrombosis and right-sided hydronephrosis. Patient was admitted for further management.   Consultants:  GI.   Procedures:  CT Abdomen/Pelvis.   Antibiotics:  N/A.   HPI/Subjective: No new issues. Tolerating meals. Only mild abdominal discomfort.   Objective: Vital signs in last 24 hours: Temp:  [99.4 F (37.4 C)-99.5 F (37.5 C)] 99.5 F (37.5 C) (08/15 0522) Pulse Rate:  [69-77] 77 (08/15 0522) Resp:  [18] 18 (08/15 0522) BP: (147)/(64-75) 147/64 mmHg (08/15 0522) SpO2:  [96 %-98 %] 96 % (08/15 0522) Weight change:  Last BM Date: 10/04/12  Intake/Output from previous day: 08/14 0701 - 08/15 0700 In: 481 [P.O.:481] Out: -  Total I/O In: 240 [P.O.:240] Out: -  Physical Exam: General: Comfortable, alert, communicative, fully oriented, not short of breath at rest.  HEENT:  Mild clinical pallor, no jaundice, no conjunctival injection or discharge. NECK:  Supple, JVP not seen, no carotid bruits, no palpable lymphadenopathy, no palpable goiter. CHEST:  Clinically clear to auscultation, no wheezes, no crackles. HEART:  Sounds 1 and 2 heard, normal, regular, no murmurs. ABDOMEN:  Full, soft, non-tender, no palpable organomegaly, no palpable masses, normal bowel sounds. GENITALIA:  Not examined. LOWER EXTREMITIES:  No pitting edema, palpable peripheral pulses. MUSCULOSKELETAL SYSTEM:  Generalized osteoarthritic changes, otherwise, normal. CENTRAL NERVOUS SYSTEM:  No focal neurologic deficit on gross examination.  Lab Results:  Recent Labs  10/03/12 0620 10/04/12 0600  WBC 5.8 8.0  HGB 9.5* 9.8*  HCT 29.6* 30.5*  PLT 202 203    Recent Labs  10/03/12 0620 10/04/12 0600  NA 142 141  K 3.8 3.6  CL 108 106  CO2 26 23  GLUCOSE 122* 135*  BUN 6 6  CREATININE 0.62 0.64  CALCIUM 9.0 9.2   No results found for this or any previous visit (from the past 240 hour(s)).   Studies/Results: Ct Angio Abdomen W/cm &/or Wo Contrast  10/03/2012   **ADDENDUM** CREATED: 10/03/2012 13:31:08  There is a second 1.5 cm lesion in the head of the pancreas that is indeterminate.  MRI the pancreas is recommended to further characterize.  Alternatively,  trans duodenal ultrasound biopsy is also a consideration.  **END ADDENDUM** SIGNED BY: Marlowe Aschoff. Hoss, M.D.  10/03/2012   *RADIOLOGY REPORT*  Clinical Data:  Portal vein thrombosis  CT ANGIOGRAPHY ABDOMEN  Technique:  Multidetector CT imaging of the abdomen was performed using the standard protocol during bolus administration of intravenous contrast.  Multiplanar reconstructed images including MIPs were obtained and reviewed to evaluate the vascular anatomy.  Contrast: OMNIPAQUE IOHEXOL 350 MG/ML SOLN   Comparison:  10/01/2012  Findings:  There is a stable 1.2 cm lesion in the body of the pancreas on image 80 series 3.  It is relatively hypovascular and may simply represent a cyst but it is too small to characterize.  Stable portal vein thrombosis extending from the portal vein confluence, occluding the entire length of the portal vein, and extending into the superior mesenteric vein as well as some tributaries.  The splenic vein is patent.  Renal veins are patent. IVC is patent.  Aorta is nonaneurysmal and patent with mild atherosclerotic change.  Celiac axis is patent.  Branch vessels are patent.  Accessory left hepatic artery.  SMA is patent with mild atherosclerotic change.  Branch vessels are patent.  IMA is patent.  Branch vessels are diminutive and patent.  Single renal arteries are patent.  Diffuse hepatic steatosis  Cholelithiasis  Several small para-aortic lymph nodes are noted.  None are greater than 1 cm short axis diameter.  Stable right hydronephrosis with a slight delay in contrast excretion from the right kidney.  There is stranding noted about the thrombosed portal vein compatible with inflammatory changes.  Small amount of free fluid about the liver.  Diffuse hepatic steatosis.  Spleen and adrenal glands are unremarkable.  Small bilateral pleural effusions and dependent atelectasis.  Advanced degenerative changes in the lumbar spine without compression deformity.  Review of the MIP images confirms the above findings.  IMPRESSION: Stable portal vein thrombosis as described.  There are associated inflammatory changes.  Stable 1.2 cm hypodensity in the body of the pancreas.  76-month follow-up is recommended as cystic neoplasm is not  entirely excluded.  Small pleural effusions have enlarged.  Small amount of free fluid is now more noticeable about the liver.  Stable cholelithiasis.  Stable right hydronephrosis and slightly delayed contrast excretion.  These are secondary findings of partial right  ureteral obstruction.  No significant obstruction in the arterial vasculature.   Original Report Authenticated By: Jolaine Click, M.D.    Medications: Scheduled Meds: . coumadin book   Does not apply Once  . fesoterodine  4 mg Oral QHS  . insulin aspart  0-9 Units Subcutaneous TID WC  . insulin glargine  10 Units Subcutaneous BH-q7a  . [START ON 10/05/2012] levothyroxine  88 mcg Oral QAC breakfast  . pantoprazole  40 mg Oral Q0600  . sodium chloride  3 mL Intravenous Q12H  . traZODone  50 mg Oral QHS  . warfarin  5 mg Oral Once  . warfarin   Does not apply Once  . Warfarin - Pharmacist Dosing Inpatient   Does not apply q1800   Continuous Infusions: . heparin 1,950 Units/hr (10/04/12 1315)   PRN Meds:.acetaminophen, acetaminophen, alum hydroxide-mag trisilicate, hydrALAZINE, LORazepam, magnesium hydroxide, morphine injection, ondansetron (ZOFRAN) IV, ondansetron, traMADol    LOS: 3 days   Thyra Yinger,CHRISTOPHER  [facility] Pager [phone]. If 8PM-8AM, please contact night-coverage at [email or web address], password [facility] 10/04/2012, 2:14 PM  LOS: 3 days

## 2012-10-04 NOTE — Progress Notes (Signed)
ANTICOAGULATION CONSULT NOTE - Follow Up Consult  Pharmacy Consult for Heparin/Coumadin Indication: Portal vein thrombosis  Allergies  Allergen Reactions  . Oxycodone-Acetaminophen Nausea Only  . Zithromax [Azithromycin]     thrush    Patient Measurements: Height: 5' 6.53" (169 cm) Weight: 236 lb 15.9 oz (107.5 kg) IBW/kg (Calculated) : 60.53 Heparin Dosing Weight: 85 kg  Vital Signs: Temp: 99.5 F (37.5 C) (08/15 0522) Temp src: Oral (08/15 0522) BP: 147/64 mmHg (08/15 0522) Pulse Rate: 77 (08/15 0522)  Labs:  Recent Labs  10/01/12 1705 10/01/12 2351 10/02/12 0515  10/03/12 0620 10/03/12 2250 10/04/12 0600  HGB 10.7*  --  9.2*  --  9.5*  --  9.8*  HCT 32.6*  --  28.6*  --  29.6*  --  30.5*  PLT 220  --  209  --  202  --  203  APTT  --  35  --   --   --   --   --   LABPROT  --   --   --   --   --  13.6 13.7  INR  --   --   --   --   --  1.06 1.07  HEPARINUNFRC  --   --   --   < > <0.10* <0.10* 0.37  CREATININE 0.71  --  0.73  --  0.62  --  0.64  < > = values in this interval not displayed.  Estimated Creatinine Clearance: 79.6 ml/min (by C-G formula based on Cr of 0.64).   Assessment: 34 YOF admitted with abdominal pain- found to have a lesion on pancreas, blood clot in portal vein and gall stones.  AC: Heparin per Rx for portal vein thrombosis. Heparin level up to 0.37 this am finally in goal range. Repeat level 0.32. Baseline INR 1.06.  ID: Tmax 99.5. WBC 8.  Cards: hx HTN, HLD; 147/64, HR 77. No cardiac meds  Endo: DM, hypothyroid; no TSH on file, no recent A1C; CBGs 102-148 on SSI and Lantus, home Synthroid  Gastrointestinal / Nutrition: GERD; 8/12 CT showed indeterminate pancreatic lesions, hydroureternophrosis, fatty liver- likely needs MRI but pt claustrophobic and had poor experience in past; LFTs nml on admit- total protein and alb down 8/13- PO PPI; GI has ordered Ca19-9 tumor marker. Po PPI  Neuro: no issues, Trazacone  Renal: SCr 0.64, CrCL  ~19mL/min on Toviaz  Pulm: 96/RA  Heme/Onc: Hgb 9.8.  PTA Medication Issues: doxasosin, Lexapro, Lasix, losartan, simva, Lyrica, Victoza all not restarted  Best Practices: IV hep  Goal of Therapy:  Heparin level 0.3-0.7 units/ml Monitor platelets by anticoagulation protocol: Yes   Plan:  Increase heparin slightly to 2000 units/hr so level does not drop overnight. Daily HL next due in am. Coumadin 5mg  po x 1 again tonight.  Theresa Summers, PharmD, BCPS Clinical Staff Pharmacist Pager [phone]  Misty Stanley Stillinger 10/04/2012,12:27 PM

## 2012-10-04 NOTE — Progress Notes (Signed)
. .[facility] Progress Note   Subjective  Eating breakfast, had some pain after supper last night   Objective     Vital signs in last 24 hours: Temp:  [97.7 F (36.5 C)-99.5 F (37.5 C)] 99.5 F (37.5 C) (08/15 0522) Pulse Rate:  [69-77] 77 (08/15 0522) Resp:  [17-18] 18 (08/15 0522) BP: (147-148)/(64-78) 147/64 mmHg (08/15 0522) SpO2:  [96 %-98 %] 96 % (08/15 0522) Last BM Date: 10/03/12 General:    white female in NAD Heart:  Regular rate and rhythm; no murmurs Lungs: Respirations even and unlabored, lungs CTA bilaterally Abdomen:  Soft, nontender and nondistended. Normal bowel sounds.minimal epigastric tenderness, Extremities:  Without edema. Neurologic:  Alert and oriented,  grossly normal neurologically. Psych:  Cooperative. Normal mood and affect.  Intake/Output from previous day: 08/14 0701 - 08/15 0700 In: 1 [P.O.:1] Out: -  Intake/Output this shift:    Lab Results:  Recent Labs  10/02/12 0515 10/03/12 0620 10/04/12 0600  WBC 6.4 5.8 8.0  HGB 9.2* 9.5* 9.8*  HCT 28.6* 29.6* 30.5*  PLT 209 202 203   BMET  Recent Labs  10/02/12 0515 10/03/12 0620 10/04/12 0600  NA 142 142 141  K 3.3* 3.8 3.6  CL 105 108 106  CO2 29 26 23  GLUCOSE 80 122* 135*  BUN 7 6 6  CREATININE 0.73 0.62 0.64  CALCIUM 8.9 9.0 9.2   LFT  Recent Labs  10/04/12 0600  PROT 6.3  ALBUMIN 3.3*  AST 24  ALT 23  ALKPHOS 47  BILITOT 0.4   PT/INR  Recent Labs  10/03/12 2250 10/04/12 0600  LABPROT 13.6 13.7  INR 1.06 1.07    Studies/Results: Ct Angio Abdomen W/cm &/or Wo Contrast  10/03/2012   **ADDENDUM** CREATED: 10/03/2012 13:31:08  There is a second 1.5 cm lesion in the head of the pancreas that is indeterminate.  MRI the pancreas is recommended to further characterize.  Alternatively,  trans duodenal ultrasound biopsy is also a consideration.  **END ADDENDUM** SIGNED BY: Arthur A. Hoss, M.D.  10/03/2012   *RADIOLOGY REPORT*  Clinical Data:  Portal  vein thrombosis  CT ANGIOGRAPHY ABDOMEN  Technique:  Multidetector CT imaging of the abdomen was performed using the standard protocol during bolus administration of intravenous contrast.  Multiplanar reconstructed images including MIPs were obtained and reviewed to evaluate the vascular anatomy.  Contrast: 100mL OMNIPAQUE IOHEXOL 350 MG/ML SOLN  Comparison:  10/01/2012  Findings:  There is a stable 1.2 cm lesion in the body of the pancreas on image 80 series 3.  It is relatively hypovascular and may simply represent a cyst but it is too small to characterize.  Stable portal vein thrombosis extending from the portal vein confluence, occluding the entire length of the portal vein, and extending into the superior mesenteric vein as well as some tributaries.  The splenic vein is patent.  Renal veins are patent. IVC is patent.  Aorta is nonaneurysmal and patent with mild atherosclerotic change.  Celiac axis is patent.  Branch vessels are patent.  Accessory left hepatic artery.  SMA is patent with mild atherosclerotic change.  Branch vessels are patent.  IMA is patent.  Branch vessels are diminutive and patent.  Single renal arteries are patent.  Diffuse hepatic steatosis  Cholelithiasis  Several small para-aortic lymph nodes are noted.  None are greater than 1 cm short axis diameter.  Stable right hydronephrosis with a slight delay in contrast excretion from the right kidney.  There is stranding noted   about the thrombosed portal vein compatible with inflammatory changes.  Small amount of free fluid about the liver.  Diffuse hepatic steatosis.  Spleen and adrenal glands are unremarkable.  Small bilateral pleural effusions and dependent atelectasis.  Advanced degenerative changes in the lumbar spine without compression deformity.  Review of the MIP images confirms the above findings.  IMPRESSION: Stable portal vein thrombosis as described.  There are associated inflammatory changes.  Stable 1.2 cm hypodensity in the body  of the pancreas.  6-month follow-up is recommended as cystic neoplasm is not entirely excluded.  Small pleural effusions have enlarged.  Small amount of free fluid is now more noticeable about the liver.  Stable cholelithiasis.  Stable right hydronephrosis and slightly delayed contrast excretion.  These are secondary findings of partial right ureteral obstruction.  No significant obstruction in the arterial vasculature.   Original Report Authenticated By: Arthur Hoss, M.D.       Assessment:  Acute portal vein thrombosis,  symptomatically improved on Hepatin infusion, no clinical evidence of ischemic bowl, Pancreatic head mass, will need EUS with FNA  As an outpatient Right hydroureter- as per Dr Nesi- will need outpatient evaluation Iron deficiency anemia, heme negative stools- will be evaluated on outpatient basis Long term anticoagulation x 6 months, Coumadin started last night and will be monitored by Pharmacy     Plan:   Requests something for sleep Aitvan 1mg hs prn Switch to oral pain meds Protime daily- discharge when INR >2.0, plan Coumadin x 6 months Gaviscon prn indigestion Arrange EUS with Dr jacobs nurse, let him review the CT scan when he returns  On 10/07/2012 ( will have to come off Coumadin) OVwith me in next 2 weeks after EUS completed to discuss diagnosis.     LOS: 3 days   Dora Brodie  10/04/2012, 7:45 AM     

## 2012-10-04 NOTE — Progress Notes (Signed)
The next opening for EUS with Dr Christella Hartigan is 10/17/2012 at 2:30. Dr Larae Grooms is currently out of town, returns on 8/18.  Dr Larae Grooms office will get back to the pt with details regarding the EUS. In meantime she should continue to take Coumadin.  Office will advise pt when to start holding Coumadin in anticipation of the EUS/FNA Also has ROV with Dr Juanda Chance for Sep 23rd.   Note that stool hemoccult 8/14 was FOB negative.   Jennye Moccasin

## 2012-10-04 NOTE — ED Provider Notes (Signed)
.  Medical screening examination/treatment/procedure(s) were conducted as a shared visit with non-physician practitioner(s) and myself.  I personally evaluated the patient during the encounter  Please see my note for my documentation  Dagmar Hait, MD 10/04/12 1136

## 2012-10-04 NOTE — Progress Notes (Signed)
ANTICOAGULATION CONSULT NOTE - Follow up Consult  Pharmacy Consult for Heparin Indication: Portal vein thrombosis  Allergies  Allergen Reactions  . Oxycodone-Acetaminophen Nausea Only  . Zithromax [Azithromycin]     thrush    Patient Measurements: Height: 5' 6.53" (169 cm) Weight: 236 lb 15.9 oz (107.5 kg) IBW/kg (Calculated) : 60.53 Heparin Dosing Weight: 85 kg  Vital Signs: Temp: 99.4 F (37.4 C) (08/14 2048) Temp src: Oral (08/14 2048) BP: 147/75 mmHg (08/14 2048) Pulse Rate: 69 (08/14 2048)  Labs:  Recent Labs  10/01/12 1705 10/01/12 2351 10/02/12 0515 10/02/12 1236 10/03/12 0620 10/03/12 2250  HGB 10.7*  --  9.2*  --  9.5*  --   HCT 32.6*  --  28.6*  --  29.6*  --   PLT 220  --  209  --  202  --   APTT  --  35  --   --   --   --   LABPROT  --   --   --   --   --  13.6  INR  --   --   --   --   --  1.06  HEPARINUNFRC  --   --   --  0.16* <0.10* <0.10*  CREATININE 0.71  --  0.73  --  0.62  --     Estimated Creatinine Clearance: 79.6 ml/min (by C-G formula based on Cr of 0.62).   Assessment: Heparin remains undetectable with no infusion related issues. No bleeding reported. Baseline inr is 1.06. Coumadin 5 mg re-entered for administration now.   Goal of Therapy:  Heparin level 0.3-0.7 units/ml Monitor platelets by anticoagulation protocol: Yes   Plan:  Re-bolus 3500 unitsx1 then increase rate to 1950/hr with 8 hour f/u   Janice Coffin

## 2012-10-05 LAB — CBC
MCH: 27.3 pg (ref 26.0–34.0)
MCV: 83.6 fL (ref 78.0–100.0)
Platelets: 187 10*3/uL (ref 150–400)
RBC: 3.48 MIL/uL — ABNORMAL LOW (ref 3.87–5.11)

## 2012-10-05 LAB — GLUCOSE, CAPILLARY
Glucose-Capillary: 131 mg/dL — ABNORMAL HIGH (ref 70–99)
Glucose-Capillary: 183 mg/dL — ABNORMAL HIGH (ref 70–99)
Glucose-Capillary: 127 mg/dL — ABNORMAL HIGH (ref 70–99)

## 2012-10-05 LAB — HEPARIN LEVEL (UNFRACTIONATED): Heparin Unfractionated: 0.34 IU/mL (ref 0.30–0.70)

## 2012-10-05 MED ORDER — INSULIN GLARGINE 100 UNIT/ML ~~LOC~~ SOLN
15.0000 [IU] | SUBCUTANEOUS | Status: DC
  Administered 2012-10-06 – 2012-10-07 (×2): 15 [IU] via SUBCUTANEOUS
  Filled 2012-10-05 (×3): qty 0.15

## 2012-10-05 MED ORDER — WARFARIN SODIUM 5 MG PO TABS
5.0000 mg | ORAL_TABLET | Freq: Once | ORAL | Status: AC
  Administered 2012-10-05: 5 mg via ORAL
  Filled 2012-10-05: qty 1

## 2012-10-05 MED ORDER — HEPARIN BOLUS VIA INFUSION
1200.0000 [IU] | Freq: Once | INTRAVENOUS | Status: AC
  Administered 2012-10-05: 1200 [IU] via INTRAVENOUS
  Filled 2012-10-05: qty 1200

## 2012-10-05 NOTE — Progress Notes (Signed)
[facility] PROGRESS NOTE  MARVINE ENCALADE ZOX:[id number] DOB: [birth year] DOA: 10/01/2012 PCP: Ruthe Mannan, MD  Assessment/Plan: Principal Problem:   Abdominal pain Active Problems:   DM   HYPERTENSION   Unspecified hypothyroidism   Portal vein thrombosis   Hydronephrosis   Iron deficiency anemia, unspecified   Nonspecific (abnormal) findings on radiological and other examination of biliary tract   Pancreatic mass    1. Portal vein thrombosis: Patient presented with progressive abdominal pain over 10 days, and CT Abdomen/Pelvis demonstrated portal venous thrombosis with surrounding stranding and extension to the superior mesenteric vein, without evidence for bowel compromise. Initial discussion was held with Dr. Stan Head, GI, who recommended ivi Heparin infusion. Managing as recommended. Patient was placed on bowel rest, ivi fluids, and analgesics. Dr Lina Sar provided GI consultation, diet was advanced and tolerated. Coumadin was started on 10/03/12, and abdominal pain has practically resolved. Once INR is therapeutic, patient will follow up with Dr Lina Sar.  2. Pancreatic lesion seen in CT scan: Yet another incidental finding. Etiology is indeterminate, but GI ordered Ca 19-9 tumor marker, which is markedly elevated at 527.5. MRI is impracticable due to severe claustrophobia. CTA abdomen with pancreatic protocol done on 10/03/12 showed stable 1.2 cm hypodensity in the body of the pancreas, as well as a second 1.5 cm lesion in the head of the pancreas that is indeterminate. Patient will follow up with Dr Rob Bunting on discharge, for possible EUS/Biopsy.  3. Right-sided hydronephrosis: This was an incidental finding on CT scan, which revealed moderate right hydroureteronephrosis, without other complicating features such as calculus. There was mild hydronephrosis described on the remote prior exam. Patient's creatinine is normal at 0.73, and urinalysis is negative. Dr Brunilda Payor,  provided urology consultation and urologist on 10/03/12 and has opined that patient will need cystoscopy, right retrograde pyelogram, ureteroscopy for further evaluation of the hydronephrosis. The procedure can be done as an outpatient after discharge. 4. Anemia: This is mild, normocytic. Anemia panel indicated iron deficiency. Following CBC. FOBT is positive. 5. Diabetes mellitus 2: This is insulin-requiring type 2. CBGs have remained satisfactory since admission, so managing with SSI/Lantus. Adjusting as indicated.  6. Hypothyroidism: On Thyroxine replacement Therapy.  7. Hypertension: Reasonably controlled on prn Hydralazine.   Code Status: Full Code.  Family Communication:  Disposition Plan: To be determined.    Brief narrative: 72 y.o. female with history of diabetes mellitus type 2, hypertension, hyperlipidemia, hypothyroidism, GERD, presenting to the ER with complaints of abdominal pain over the last 10 days, initially in the epigastric area, then became more diffuse. Pain is present even without eating and eating increases the pain. Patient had one episode nausea and vomiting. Denies any diarrhea and has had regular bowel movement. In the ER CT Abdomen/Pelvis shows features concerning for portal vein thrombosis and right-sided hydronephrosis. Patient was admitted for further management.   Consultants:  GI.   Procedures:  CT Abdomen/Pelvis.   Antibiotics:  N/A.   HPI/Subjective: No new issues.  Objective: Vital signs in last 24 hours: Temp:  [98.3 F (36.8 C)-99.6 F (37.6 C)] 98.4 F (36.9 C) (08/16 1406) Pulse Rate:  [75-87] 87 (08/16 1406) Resp:  [16-18] 18 (08/16 1406) BP: (114-150)/(64-89) 114/68 mmHg (08/16 1406) SpO2:  [92 %-95 %] 92 % (08/16 1406) Weight change:  Last BM Date: 10/04/12  Intake/Output from previous day: 08/15 0701 - 08/16 0700 In: 520 [P.O.:520] Out: -  Total I/O In: 240 [P.O.:240] Out: -    Physical Exam: General:  Comfortable, alert,  communicative, fully oriented, not short of breath at rest.  HEENT:  Mild clinical pallor, no jaundice, no conjunctival injection or discharge. NECK:  Supple, JVP not seen, no carotid bruits, no palpable lymphadenopathy, no palpable goiter. CHEST:  Clinically clear to auscultation, no wheezes, no crackles. HEART:  Sounds 1 and 2 heard, normal, regular, no murmurs. ABDOMEN:  Full, soft, non-tender, no palpable organomegaly, no palpable masses, normal bowel sounds. GENITALIA:  Not examined. LOWER EXTREMITIES:  No pitting edema, palpable peripheral pulses. MUSCULOSKELETAL SYSTEM:  Generalized osteoarthritic changes, otherwise, normal. CENTRAL NERVOUS SYSTEM:  No focal neurologic deficit on gross examination.  Lab Results:  Recent Labs  10/04/12 0600 10/05/12 0600  WBC 8.0 6.6  HGB 9.8* 9.5*  HCT 30.5* 29.1*  PLT 203 187    Recent Labs  10/03/12 0620 10/04/12 0600  NA 142 141  K 3.8 3.6  CL 108 106  CO2 26 23  GLUCOSE 122* 135*  BUN 6 6  CREATININE 0.62 0.64  CALCIUM 9.0 9.2   No results found for this or any previous visit (from the past 240 hour(s)).   Studies/Results: No results found.  Medications: Scheduled Meds: . doxazosin  8 mg Oral Daily  . escitalopram  20 mg Oral Daily  . fesoterodine  4 mg Oral QHS  . furosemide  40 mg Oral Daily  . insulin aspart  0-9 Units Subcutaneous TID WC  . [START ON 10/06/2012] insulin glargine  15 Units Subcutaneous BH-q7a  . levothyroxine  88 mcg Oral QAC breakfast  . pantoprazole  40 mg Oral Q0600  . pregabalin  75 mg Oral BID  . simvastatin  20 mg Oral q1800  . sodium chloride  3 mL Intravenous Q12H  . traZODone  50 mg Oral QHS  . warfarin  5 mg Oral ONCE-1800  . Warfarin - Pharmacist Dosing Inpatient   Does not apply q1800   Continuous Infusions: . heparin 2,150 Units/hr (10/05/12 1302)   PRN Meds:.acetaminophen, acetaminophen, alum hydroxide-mag trisilicate, famotidine, hydrALAZINE, LORazepam, magnesium hydroxide,  morphine injection, ondansetron (ZOFRAN) IV, ondansetron, traMADol    LOS: 4 days   Braedyn Kauk,CHRISTOPHER  [facility] Pager [phone]. If 8PM-8AM, please contact night-coverage at [email or web address], password [facility] 10/05/2012, 2:56 PM  LOS: 4 days

## 2012-10-05 NOTE — Progress Notes (Signed)
Subjective: No acute events.  Feels well.  Her abdominal pain is minimal  Objective: Vital signs in last 24 hours: Temp:  [98.1 F (36.7 C)-99.6 F (37.6 C)] 98.3 F (36.8 C) (08/16 0501) Pulse Rate:  [75-77] 77 (08/16 0501) Resp:  [16-18] 18 (08/16 0501) BP: (146-150)/(63-89) 150/89 mmHg (08/16 0501) SpO2:  [95 %-98 %] 95 % (08/16 0501) Last BM Date: 10/04/12  Intake/Output from previous day: 08/15 0701 - 08/16 0700 In: 520 [P.O.:520] Out: -  Intake/Output this shift:    General appearance: alert and no distress GI: soft, non-tender; bowel sounds normal; no masses,  no organomegaly  Lab Results:  Recent Labs  10/03/12 0620 10/04/12 0600 10/05/12 0600  WBC 5.8 8.0 6.6  HGB 9.5* 9.8* 9.5*  HCT 29.6* 30.5* 29.1*  PLT 202 203 187   BMET  Recent Labs  10/03/12 0620 10/04/12 0600  NA 142 141  K 3.8 3.6  CL 108 106  CO2 26 23  GLUCOSE 122* 135*  BUN 6 6  CREATININE 0.62 0.64  CALCIUM 9.0 9.2   LFT  Recent Labs  10/04/12 0600  PROT 6.3  ALBUMIN 3.3*  AST 24  ALT 23  ALKPHOS 47  BILITOT 0.4   PT/INR  Recent Labs  10/04/12 0600 10/05/12 0600  LABPROT 13.7 14.4  INR 1.07 1.14   Hepatitis Panel No results found for this basename: HEPBSAG, HCVAB, HEPAIGM, HEPBIGM,  in the last 72 hours C-Diff No results found for this basename: CDIFFTOX,  in the last 72 hours Fecal Lactopherrin No results found for this basename: FECLLACTOFRN,  in the last 72 hours  Studies/Results: Ct Angio Abdomen W/cm &/or Wo Contrast  10/03/2012   **ADDENDUM** CREATED: 10/03/2012 13:31:08  There is a second 1.5 cm lesion in the head of the pancreas that is indeterminate.  MRI the pancreas is recommended to further characterize.  Alternatively,  trans duodenal ultrasound biopsy is also a consideration.  **END ADDENDUM** SIGNED BY: Marlowe Aschoff. Hoss, M.D.  10/03/2012   *RADIOLOGY REPORT*  Clinical Data:  Portal vein thrombosis  CT ANGIOGRAPHY ABDOMEN  Technique:  Multidetector CT  imaging of the abdomen was performed using the standard protocol during bolus administration of intravenous contrast.  Multiplanar reconstructed images including MIPs were obtained and reviewed to evaluate the vascular anatomy.  Contrast: OMNIPAQUE IOHEXOL 350 MG/ML SOLN  Comparison:  10/01/2012  Findings:  There is a stable 1.2 cm lesion in the body of the pancreas on image 80 series 3.  It is relatively hypovascular and may simply represent a cyst but it is too small to characterize.  Stable portal vein thrombosis extending from the portal vein confluence, occluding the entire length of the portal vein, and extending into the superior mesenteric vein as well as some tributaries.  The splenic vein is patent.  Renal veins are patent. IVC is patent.  Aorta is nonaneurysmal and patent with mild atherosclerotic change.  Celiac axis is patent.  Branch vessels are patent.  Accessory left hepatic artery.  SMA is patent with mild atherosclerotic change.  Branch vessels are patent.  IMA is patent.  Branch vessels are diminutive and patent.  Single renal arteries are patent.  Diffuse hepatic steatosis  Cholelithiasis  Several small para-aortic lymph nodes are noted.  None are greater than 1 cm short axis diameter.  Stable right hydronephrosis with a slight delay in contrast excretion from the right kidney.  There is stranding noted about the thrombosed portal vein compatible with inflammatory changes.  Small amount of free fluid about the liver.  Diffuse hepatic steatosis.  Spleen and adrenal glands are unremarkable.  Small bilateral pleural effusions and dependent atelectasis.  Advanced degenerative changes in the lumbar spine without compression deformity.  Review of the MIP images confirms the above findings.  IMPRESSION: Stable portal vein thrombosis as described.  There are associated inflammatory changes.  Stable 1.2 cm hypodensity in the body of the pancreas.  25-month follow-up is recommended as cystic neoplasm  is not entirely excluded.  Small pleural effusions have enlarged.  Small amount of free fluid is now more noticeable about the liver.  Stable cholelithiasis.  Stable right hydronephrosis and slightly delayed contrast excretion.  These are secondary findings of partial right ureteral obstruction.  No significant obstruction in the arterial vasculature.   Original Report Authenticated By: Jolaine Click, M.D.    Medications:  Scheduled: . doxazosin  8 mg Oral Daily  . escitalopram  20 mg Oral Daily  . fesoterodine  4 mg Oral QHS  . furosemide  40 mg Oral Daily  . insulin aspart  0-9 Units Subcutaneous TID WC  . insulin glargine  10 Units Subcutaneous BH-q7a  . levothyroxine  88 mcg Oral QAC breakfast  . pantoprazole  40 mg Oral Q0600  . pregabalin  75 mg Oral BID  . simvastatin  20 mg Oral q1800  . sodium chloride  3 mL Intravenous Q12H  . traZODone  50 mg Oral QHS  . Warfarin - Pharmacist Dosing Inpatient   Does not apply q1800   Continuous: . heparin 2,000 Units/hr (10/04/12 1559)    Assessment/Plan: 1) Portal Vein Thrombosis. 2) 1.2 cm pancreatic head mass.   INR is at 1.14.  Overall she is stable.    Plan: 1) Continue with heparin and coumadin. 2) D/C home when INR is therapeutic.   LOS: 4 days   Gareth Fitzner D 10/05/2012, 8:50 AM

## 2012-10-05 NOTE — Progress Notes (Addendum)
ANTICOAGULATION CONSULT NOTE - Follow Up Consult  Pharmacy Consult for Heparin and Warfarin Indication: Portal Vein Thrombosis  Allergies  Allergen Reactions  . Oxycodone-Acetaminophen Nausea Only  . Zithromax [Azithromycin]     thrush    Patient Measurements: Height: 5' 6.53" (169 cm) Weight: 236 lb 15.9 oz (107.5 kg) IBW/kg (Calculated) : 60.53 Heparin Dosing Weight: 84kg  Vital Signs: Temp: 98.4 F (36.9 C) (08/16 1406) Temp src: Oral (08/16 1406) BP: 114/68 mmHg (08/16 1406) Pulse Rate: 87 (08/16 1406)  Labs:  Recent Labs  10/03/12 0620 10/03/12 2250 10/04/12 0600 10/04/12 1352 10/05/12 0600 10/05/12 1733  HGB 9.5*  --  9.8*  --  9.5*  --   HCT 29.6*  --  30.5*  --  29.1*  --   PLT 202  --  203  --  187  --   LABPROT  --  13.6 13.7  --  14.4  --   INR  --  1.06 1.07  --  1.14  --   HEPARINUNFRC <0.10* <0.10* 0.37 0.32 0.25* 0.34  CREATININE 0.62  --  0.64  --   --   --     Estimated Creatinine Clearance: 79.6 ml/min (by C-G formula based on Cr of 0.64).   Medications:  Scheduled:  . doxazosin  8 mg Oral Daily  . escitalopram  20 mg Oral Daily  . fesoterodine  4 mg Oral QHS  . furosemide  40 mg Oral Daily  . insulin aspart  0-9 Units Subcutaneous TID WC  . [START ON 10/06/2012] insulin glargine  15 Units Subcutaneous BH-q7a  . levothyroxine  88 mcg Oral QAC breakfast  . pantoprazole  40 mg Oral Q0600  . pregabalin  75 mg Oral BID  . simvastatin  20 mg Oral q1800  . sodium chloride  3 mL Intravenous Q12H  . traZODone  50 mg Oral QHS  . Warfarin - Pharmacist Dosing Inpatient   Does not apply q1800   Infusions:  . heparin 2,150 Units/hr (10/05/12 1302)    Assessment: 71 yo F with abdominal pain found to have portal vein thrombosis on heparin and warfarin overlap day #3.  Heparin level 0.34 this evening, CBC stable, renal function stable, no overt bleeding noted  Goal of Therapy:  Heparin level 0.3-0.7 units/ml Monitor platelets by anticoagulation  protocol: Yes   Plan:  -Continue heparin at 2150 units/hr -Check 8 hour HL at 0200 -Daily CBC/HL -Monitor for bleeding -Warfarin per previous note  Thank you for allowing me to take part in this patient's care,  Abran Duke, PharmD Clinical Pharmacist Phone: [phone] Pager: [phone] 10/05/2012 6:54 PM   Addendum: Follow-up heparin level (0.27) decreased to subtherapeutic level - will increase heparin rate and check follow-up 8 hour heparin level. - H/H and Plts stable - No significant bleeding reported - No problems with line/infusion  Plan: 1. Increase heparin drip to 2350 units/hr (23.5 ml/hr) 2. Check heparin level 8 hours after rate increase  Wilfred Lacy, PharmD Clinical Pharmacist [phone] 10/06/2012, 3:01 AM

## 2012-10-05 NOTE — Progress Notes (Signed)
ANTICOAGULATION CONSULT NOTE - Follow Up Consult  Pharmacy Consult for Heparin and Coumadin Indication: Portal Vein Thrombosis  Allergies  Allergen Reactions  . Oxycodone-Acetaminophen Nausea Only  . Zithromax [Azithromycin]     thrush    Patient Measurements: Height: 5' 6.53" (169 cm) Weight: 236 lb 15.9 oz (107.5 kg) IBW/kg (Calculated) : 60.53 Heparin Dosing Weight: 84kg  Vital Signs: Temp: 98.3 F (36.8 C) (08/16 0501) BP: 150/89 mmHg (08/16 0501) Pulse Rate: 77 (08/16 0501)  Labs:  Recent Labs  10/03/12 0620 10/03/12 2250 10/04/12 0600 10/04/12 1352 10/05/12 0600  HGB 9.5*  --  9.8*  --  9.5*  HCT 29.6*  --  30.5*  --  29.1*  PLT 202  --  203  --  187  LABPROT  --  13.6 13.7  --  14.4  INR  --  1.06 1.07  --  1.14  HEPARINUNFRC <0.10* <0.10* 0.37 0.32 0.25*  CREATININE 0.62  --  0.64  --   --     Estimated Creatinine Clearance: 79.6 ml/min (by C-G formula based on Cr of 0.64).   Medications:  Scheduled:  . doxazosin  8 mg Oral Daily  . escitalopram  20 mg Oral Daily  . fesoterodine  4 mg Oral QHS  . furosemide  40 mg Oral Daily  . insulin aspart  0-9 Units Subcutaneous TID WC  . insulin glargine  10 Units Subcutaneous BH-q7a  . levothyroxine  88 mcg Oral QAC breakfast  . pantoprazole  40 mg Oral Q0600  . pregabalin  75 mg Oral BID  . simvastatin  20 mg Oral q1800  . sodium chloride  3 mL Intravenous Q12H  . traZODone  50 mg Oral QHS  . Warfarin - Pharmacist Dosing Inpatient   Does not apply q1800   Infusions:  . heparin 2,000 Units/hr (10/04/12 1559)    Assessment: 72 yo F with abdominal pain found to have portal vein thrombosis and is on IV heparin gtt and coumadin overlap day #3.  Heparin level is subtherapeutic today at 0.25.  Patient has had two levels within goal range of 0.3-0.7 since beginning drip, but those were on the lower end.  Will rebolus at ~15mg /kg and increase rate by ~2mg /kg/hr.  Patient has received two daily doses of  Coumadin so far, both 5mg .  INR is beginning to rise today at 1.14.  With patients age, suspect will see more of a response in the next couple days.  Will repeat dose, and be better able to evaluate if dose increase is warranted tomorrow. This is day 3 of overlap and will aim to have a total of at least 5 days overlap with heparin, and have two therapeutic INR's drawn 24 hours apart.   No bleeding complications or interruptions in therapy noted, confirmed with RN.  Goal of Therapy:  INR 2-3 Heparin level 0.3-0.7 units/ml Monitor platelets by anticoagulation protocol: Yes   Plan:  - bolus Hep IV 1200 units - increase heparin gtt to 2150 units/hr - Coumadin 5mg  PO x1 dose tonight - recheck HL in 8 hour - daily HL and CBC - monitor for s/s of bleeding  Harrold Donath E. Achilles Dunk, PharmD Clinical Pharmacist - Resident Pager: [phone] Pharmacy: [phone] 10/05/2012 9:23 AM

## 2012-10-06 LAB — COMPREHENSIVE METABOLIC PANEL
Albumin: 2.9 g/dL — ABNORMAL LOW (ref 3.5–5.2)
BUN: 6 mg/dL (ref 6–23)
Calcium: 8.7 mg/dL (ref 8.4–10.5)
Creatinine, Ser: 0.54 mg/dL (ref 0.50–1.10)
Total Protein: 6.2 g/dL (ref 6.0–8.3)

## 2012-10-06 LAB — CBC
MCH: 27.2 pg (ref 26.0–34.0)
MCHC: 32.8 g/dL (ref 30.0–36.0)
Platelets: 174 10*3/uL (ref 150–400)
RBC: 3.45 MIL/uL — ABNORMAL LOW (ref 3.87–5.11)
RDW: 14.8 % (ref 11.5–15.5)

## 2012-10-06 LAB — GLUCOSE, CAPILLARY
Glucose-Capillary: 178 mg/dL — ABNORMAL HIGH (ref 70–99)
Glucose-Capillary: 146 mg/dL — ABNORMAL HIGH (ref 70–99)

## 2012-10-06 LAB — PROTIME-INR: Prothrombin Time: 14.7 seconds (ref 11.6–15.2)

## 2012-10-06 MED ORDER — ENOXAPARIN SODIUM 120 MG/0.8ML ~~LOC~~ SOLN
110.0000 mg | Freq: Two times a day (BID) | SUBCUTANEOUS | Status: DC
  Administered 2012-10-06 – 2012-10-07 (×3): 110 mg via SUBCUTANEOUS
  Filled 2012-10-06 (×4): qty 0.8

## 2012-10-06 MED ORDER — POTASSIUM CHLORIDE CRYS ER 20 MEQ PO TBCR
40.0000 meq | EXTENDED_RELEASE_TABLET | Freq: Once | ORAL | Status: DC
  Filled 2012-10-06: qty 2

## 2012-10-06 MED ORDER — POTASSIUM CHLORIDE 20 MEQ/15ML (10%) PO LIQD
40.0000 meq | Freq: Once | ORAL | Status: AC
  Administered 2012-10-06: 40 meq via ORAL
  Filled 2012-10-06: qty 30

## 2012-10-06 MED ORDER — WARFARIN SODIUM 7.5 MG PO TABS
7.5000 mg | ORAL_TABLET | Freq: Once | ORAL | Status: AC
  Administered 2012-10-06: 7.5 mg via ORAL
  Filled 2012-10-06: qty 1

## 2012-10-06 NOTE — Progress Notes (Signed)
ANTICOAGULATION CONSULT NOTE - Initial Consult  Pharmacy Consult for Lovenox (transitioning from Heparin gtt), Coumadin Indication: Portal Vein Thrombosis  Allergies  Allergen Reactions  . Oxycodone-Acetaminophen Nausea Only  . Zithromax [Azithromycin]     thrush    Patient Measurements: Height: 5' 6.53" (169 cm) Weight: 236 lb 15.9 oz (107.5 kg) IBW/kg (Calculated) : 60.53 Heparin Dosing Weight: 84kg  Vital Signs: Temp: 98.9 F (37.2 C) (08/17 0624) BP: 137/61 mmHg (08/17 0624) Pulse Rate: 71 (08/17 0624)  Labs:  Recent Labs  10/04/12 0600  10/05/12 0600 10/05/12 1733 10/06/12 0142  HGB 9.8*  --  9.5*  --  9.4*  HCT 30.5*  --  29.1*  --  28.7*  PLT 203  --  187  --  174  LABPROT 13.7  --  14.4  --  14.7  INR 1.07  --  1.14  --  1.17  HEPARINUNFRC 0.37  < > 0.25* 0.34 0.27*  CREATININE 0.64  --   --   --  0.54  < > = values in this interval not displayed.  Estimated Creatinine Clearance: 79.6 ml/min (by C-G formula based on Cr of 0.54).   Medical History: Past Medical History  Diagnosis Date  . Trigger finger   . Hyperlipidemia   . Diabetes mellitus   . Chronic venous insufficiency   . Morbid obesity   . Hypertension   . Thyroid disease   . GERD (gastroesophageal reflux disease)   . Hypothyroidism   . Arthritis   . Anemia     Medications:  Scheduled:  . doxazosin  8 mg Oral Daily  . escitalopram  20 mg Oral Daily  . fesoterodine  4 mg Oral QHS  . furosemide  40 mg Oral Daily  . insulin aspart  0-9 Units Subcutaneous TID WC  . insulin glargine  15 Units Subcutaneous BH-q7a  . levothyroxine  88 mcg Oral QAC breakfast  . pantoprazole  40 mg Oral Q0600  . potassium chloride  40 mEq Oral Once  . pregabalin  75 mg Oral BID  . simvastatin  20 mg Oral q1800  . sodium chloride  3 mL Intravenous Q12H  . traZODone  50 mg Oral QHS  . Warfarin - Pharmacist Dosing Inpatient   Does not apply q1800   Infusions:  . heparin 2,350 Units/hr (10/06/12 0306)     Assessment: 71 yo F with abdominal pain found to have portal vein thrombosis and is on IV heparin gtt and coumadin overlap day #4. Pharmacy consulted to transition heparin gtt to treatment dose lovenox. Patient's kidney function is improved with a SCr of 0.54 and estimated CrCl ~80. H/H is low, but stable. Platelets trending down slightly, will continue to monitor.  Patient has received three daily doses of Coumadin so far, all 5mg . INR is just a slight rise today at 1.17. With patients age, was suspecting a delayed response but looks to need dose increase.  This is day 4 of overlap and will aim to have a total of at least 5 days overlap with heparin product, and have two therapeutic INR's drawn 24 hours apart.   No bleeding complications noted.   Goal of Therapy:  INR 2-3 Anti-Xa level 0.6-1.2 units/ml 4hrs after LMWH dose given Monitor platelets by anticoagulation protocol: Yes   Plan:  - discontinue heparin gtt - schedule first dose of Lovenox 110mg  SQ one hour after heparin gtt stopped (@ 1230, spoke with RN), then 110mg  SQ q12h  - Coumadin PO 7.5mg   x1 dose tonight - daily INRs - CBC every 72h - monitor trend in platelets and for s/s of bleeding  Harrold Donath E. Achilles Dunk, PharmD Clinical Pharmacist - Resident Pager: [phone] Pharmacy: [phone] 10/06/2012 11:31 AM

## 2012-10-06 NOTE — Progress Notes (Signed)
[facility] PROGRESS NOTE  Theresa Summers ZOX:[id number] DOB: 1940-07-01 DOA: 10/01/2012 PCP: Ruthe Mannan, MD  Assessment/Plan: Principal Problem:   Abdominal pain Active Problems:   DM   HYPERTENSION   Unspecified hypothyroidism   Portal vein thrombosis   Hydronephrosis   Iron deficiency anemia, unspecified   Nonspecific (abnormal) findings on radiological and other examination of biliary tract   Pancreatic mass    1. Portal vein thrombosis: Patient presented with progressive abdominal pain over 10 days, and CT Abdomen/Pelvis demonstrated portal venous thrombosis with surrounding stranding and extension to the superior mesenteric vein, without evidence for bowel compromise. Initial discussion was held with Dr. Stan Head, GI, who recommended ivi Heparin infusion. Managing as recommended. Patient was placed on bowel rest, ivi fluids, and analgesics. Dr Lina Sar provided GI consultation, diet was advanced and tolerated. Coumadin was started on 10/03/12, and abdominal pain has practically resolved. INR is still sub-therapeutic. Will transition to therapeutic Lovenox today. Patient will follow up with Dr Lina Sar on discharge. PMD will monitor INR and adjust Coumadin dose as indicated, as well as determine timing of discontinuation of Lovenox.  2. Pancreatic lesion seen in CT scan: Yet another incidental finding. Etiology is indeterminate, but GI ordered Ca 19-9 tumor marker, which is markedly elevated at 527.5. MRI is impracticable due to severe claustrophobia. CTA abdomen with pancreatic protocol done on 10/03/12 showed stable 1.2 cm hypodensity in the body of the pancreas, as well as a second 1.5 cm lesion in the head of the pancreas that is indeterminate. Patient will follow up with Dr Rob Bunting on discharge, for possible EUS/Biopsy.  3. Right-sided hydronephrosis: This was an incidental finding on CT scan, which revealed moderate right hydroureteronephrosis, without other  complicating features such as calculus. There was mild hydronephrosis described on the remote prior exam. Patient's creatinine is normal at 0.73, and urinalysis is negative. Dr Brunilda Payor, provided urology consultation and urologist on 10/03/12 and has opined that patient will need cystoscopy, right retrograde pyelogram, ureteroscopy for further evaluation of the hydronephrosis. The procedure can be done as an outpatient after discharge. 4. Anemia: This is mild, normocytic. Anemia panel indicated iron deficiency. Following CBC. FOBT is positive. HB has remained stable/reasonable.  5. Diabetes mellitus 2: This is insulin-requiring type 2. CBGs have remained satisfactory since admission, so managing with SSI/Lantus. Adjusting as indicated.  6. Hypothyroidism: On Thyroxine replacement Therapy.  7. Hypertension: Reasonably controlled on prn Hydralazine.   Code Status: Full Code.  Family Communication:  Disposition Plan: To be determined.    Brief narrative: 72 y.o. female with history of diabetes mellitus type 2, hypertension, hyperlipidemia, hypothyroidism, GERD, presenting to the ER with complaints of abdominal pain over the last 10 days, initially in the epigastric area, then became more diffuse. Pain is present even without eating and eating increases the pain. Patient had one episode nausea and vomiting. Denies any diarrhea and has had regular bowel movement. In the ER CT Abdomen/Pelvis shows features concerning for portal vein thrombosis and right-sided hydronephrosis. Patient was admitted for further management.   Consultants:  Dr Lina Sar, GI.  Dr Su Grand, Urology.    Procedures:  CT Abdomen/Pelvis.   CTA abdomen.   Antibiotics:  N/A.   HPI/Subjective: Asymptomatic, apart from occasional abdominal discomfort after meals.   Objective: Vital signs in last 24 hours: Temp:  [98.4 F (36.9 C)-99.3 F (37.4 C)] 98.9 F (37.2 C) (08/17 0624) Pulse Rate:  [71-87] 71 (08/17  0624) Resp:  [18] 18 (08/17  4782) BP: (114-144)/(61-68) 137/61 mmHg (08/17 0624) SpO2:  [92 %-96 %] 96 % (08/17 0624) Weight change:  Last BM Date: 10/05/12  Intake/Output from previous day: 08/16 0701 - 08/17 0700 In: 960 [P.O.:960] Out: -      Physical Exam: General: Comfortable, alert, communicative, fully oriented, not short of breath at rest.  HEENT:  Mild clinical pallor, no jaundice, no conjunctival injection or discharge. NECK:  Supple, JVP not seen, no carotid bruits, no palpable lymphadenopathy, no palpable goiter. CHEST:  Clinically clear to auscultation, no wheezes, no crackles. HEART:  Sounds 1 and 2 heard, normal, regular, no murmurs. ABDOMEN:  Full, soft, non-tender, no palpable organomegaly, no palpable masses, normal bowel sounds. GENITALIA:  Not examined. LOWER EXTREMITIES:  No pitting edema, palpable peripheral pulses. MUSCULOSKELETAL SYSTEM:  Generalized osteoarthritic changes, otherwise, normal. CENTRAL NERVOUS SYSTEM:  No focal neurologic deficit on gross examination.  Lab Results:  Recent Labs  10/05/12 0600 10/06/12 0142  WBC 6.6 6.0  HGB 9.5* 9.4*  HCT 29.1* 28.7*  PLT 187 174    Recent Labs  10/04/12 0600 10/06/12 0142  NA 141 141  K 3.6 3.2*  CL 106 106  CO2 23 26  GLUCOSE 135* 146*  BUN 6 6  CREATININE 0.64 0.54  CALCIUM 9.2 8.7   No results found for this or any previous visit (from the past 240 hour(s)).   Studies/Results: No results found.  Medications: Scheduled Meds: . doxazosin  8 mg Oral Daily  . escitalopram  20 mg Oral Daily  . fesoterodine  4 mg Oral QHS  . furosemide  40 mg Oral Daily  . insulin aspart  0-9 Units Subcutaneous TID WC  . insulin glargine  15 Units Subcutaneous BH-q7a  . levothyroxine  88 mcg Oral QAC breakfast  . pantoprazole  40 mg Oral Q0600  . potassium chloride  40 mEq Oral Once  . pregabalin  75 mg Oral BID  . simvastatin  20 mg Oral q1800  . sodium chloride  3 mL Intravenous Q12H  .  traZODone  50 mg Oral QHS  . Warfarin - Pharmacist Dosing Inpatient   Does not apply q1800   Continuous Infusions: . heparin 2,350 Units/hr (10/06/12 0306)   PRN Meds:.acetaminophen, acetaminophen, alum hydroxide-mag trisilicate, famotidine, hydrALAZINE, LORazepam, magnesium hydroxide, morphine injection, ondansetron (ZOFRAN) IV, ondansetron, traMADol    LOS: 5 days   Cydney Alvarenga,CHRISTOPHER  [facility] Pager [phone]. If 8PM-8AM, please contact night-coverage at [email or web address], password [facility] 10/06/2012, 10:53 AM  LOS: 5 days

## 2012-10-06 NOTE — Progress Notes (Signed)
Subjective: No acute events.  No complaints of abdominal pain.  Objective: Vital signs in last 24 hours: Temp:  [98.4 F (36.9 C)-99.3 F (37.4 C)] 98.9 F (37.2 C) (08/17 0624) Pulse Rate:  [71-87] 71 (08/17 0624) Resp:  [18] 18 (08/17 0624) BP: (114-144)/(61-68) 137/61 mmHg (08/17 0624) SpO2:  [92 %-96 %] 96 % (08/17 0624) Last BM Date: 10/05/12  Intake/Output from previous day: 08/16 0701 - 08/17 0700 In: 960 [P.O.:960] Out: -  Intake/Output this shift:    General appearance: alert and no distress GI: soft, non-tender; bowel sounds normal; no masses,  no organomegaly  Lab Results:  Recent Labs  10/04/12 0600 10/05/12 0600 10/06/12 0142  WBC 8.0 6.6 6.0  HGB 9.8* 9.5* 9.4*  HCT 30.5* 29.1* 28.7*  PLT 203 187 174   BMET  Recent Labs  10/04/12 0600 10/06/12 0142  NA 141 141  K 3.6 3.2*  CL 106 106  CO2 23 26  GLUCOSE 135* 146*  BUN 6 6  CREATININE 0.64 0.54  CALCIUM 9.2 8.7   LFT  Recent Labs  10/06/12 0142  PROT 6.2  ALBUMIN 2.9*  AST 16  ALT 17  ALKPHOS 42  BILITOT 0.4   PT/INR  Recent Labs  10/05/12 0600 10/06/12 0142  LABPROT 14.4 14.7  INR 1.14 1.17   Hepatitis Panel No results found for this basename: HEPBSAG, HCVAB, HEPAIGM, HEPBIGM,  in the last 72 hours C-Diff No results found for this basename: CDIFFTOX,  in the last 72 hours Fecal Lactopherrin No results found for this basename: FECLLACTOFRN,  in the last 72 hours  Studies/Results: No results found.  Medications:  Scheduled: . doxazosin  8 mg Oral Daily  . escitalopram  20 mg Oral Daily  . fesoterodine  4 mg Oral QHS  . furosemide  40 mg Oral Daily  . insulin aspart  0-9 Units Subcutaneous TID WC  . insulin glargine  15 Units Subcutaneous BH-q7a  . levothyroxine  88 mcg Oral QAC breakfast  . pantoprazole  40 mg Oral Q0600  . pregabalin  75 mg Oral BID  . simvastatin  20 mg Oral q1800  . sodium chloride  3 mL Intravenous Q12H  . traZODone  50 mg Oral QHS  .  Warfarin - Pharmacist Dosing Inpatient   Does not apply q1800   Continuous: . heparin 2,350 Units/hr (10/06/12 0306)    Assessment/Plan: 1) Acute portal vein thrombosis. 2) 1.2 cm pancreatic head mass.   INR is at 1.17.  She is clinically stable.  Plan: 1) Continue with coumadin until therapeutic.   LOS: 5 days   Theresa Summers D 10/06/2012, 8:29 AM

## 2012-10-06 NOTE — Discharge Summary (Signed)
Physician Discharge Summary  BETHANNIE IGLEHART ION:[id number] DOB: 03-19-1940 DOA: 10/01/2012  PCP: Ruthe Mannan, MD  Admit date: 10/01/2012 Discharge date: 10/07/2012  Time spent: 40 minutes  Recommendations for Outpatient Follow-up:  1. Follow up with primary MD. 2. Primary MD to monitor INR, adjust Coumadin dosage, and determine timing of Lovenox discontinuation. Patient recommended to visit PMD's office in AM of Friday 10/11/12 for blood work. 3. Follow up with Dr Lina Sar, GI. 4. Follow up with Dr Rob Bunting, GI, for endoscopic ultrasound. 5. Follow up with Dr Brunilda Payor, urologist.  Discharge Diagnoses:  Principal Problem:   Abdominal pain Active Problems:   DM   HYPERTENSION   Unspecified hypothyroidism   Portal vein thrombosis   Hydronephrosis   Iron deficiency anemia, unspecified   Nonspecific (abnormal) findings on radiological and other examination of biliary tract   Pancreatic mass   Discharge Condition: Satisfactory.   Diet recommendation: Heart-Healthy/Carbohydrate-Modified.   Filed Weights   10/02/12 0300 10/03/12 0518  Weight: 107 kg (235 lb 14.3 oz) 107.5 kg (236 lb 15.9 oz)    History of present illness:  72 y.o. female with history of diabetes mellitus type 2, hypertension, hyperlipidemia, hypothyroidism, GERD, presenting to the ER with complaints of abdominal pain over the last 10 days, initially in the epigastric area, then became more diffuse. Pain is present even without eating and eating increases the pain. Patient had one episode nausea and vomiting. Denies any diarrhea and has had regular bowel movement. In the ER CT Abdomen/Pelvis shows features concerning for portal vein thrombosis and right-sided hydronephrosis. Patient was admitted for further management.   Hospital Course:  1. Portal vein thrombosis: Patient presented with progressive abdominal pain over 10 days, and CT Abdomen/Pelvis demonstrated portal venous thrombosis with surrounding stranding and  extension to the superior mesenteric vein, without evidence for bowel compromise. Initial discussion was held with Dr. Stan Head, GI, who recommended ivi Heparin infusion. Patient was managed as recommended, placed on bowel rest, ivi fluids, and analgesics. Dr Lina Sar provided GI consultation, diet was advanced and tolerated. Coumadin was started on 10/03/12, and as of 10/06/12, abdominal pain has resolved. Patient was transitioned to therapeutic Lovenox on 10/06/12. Patient will follow up with Dr Lina Sar on discharge, while PMD will monitor INR, adjust Coumadin dose as indicated, and determine timing of discontinuation of Lovenox.  2. Pancreatic lesion seen in CT scan: Yet another incidental finding. Etiology is indeterminate, but GI ordered Ca 19-9 tumor marker, which is markedly elevated at 527.5. MRI was impracticable due to severe claustrophobia. CTA abdomen with pancreatic protocol done on 10/03/12 showed stable 1.2 cm hypodensity in the body of the pancreas, as well as a second 1.5 cm lesion in the head of the pancreas that is indeterminate. Patient will follow up with Dr Rob Bunting on discharge, for possible EUS/Biopsy.  3. Right-sided hydronephrosis: This was an incidental finding on CT scan, which revealed moderate right hydroureteronephrosis, without other complicating features such as calculus (there was mild hydronephrosis described on the remote prior exam). Patient's creatinine was normal at 0.73, and urinalysis, negative. Dr Brunilda Payor provided urology consultation on 10/03/12 and has opined that patient will need cystoscopy, right retrograde pyelogram, ureteroscopy for further evaluation of the hydronephrosis. The procedure will be done as an outpatient after discharge.  4. Anemia: This is mild, normocytic. Anemia panel indicated iron deficiency and FOBT was positive. HB remained stable/reasonable during hospitalization.  5. Diabetes mellitus 2: This is insulin-requiring type 2. Managed with  diet,  SSI and Lantus, adjusted as indicated. CBGs remained satisfactory throughout hospitalization.  6. Hypothyroidism: On Thyroxine replacement Therapy.  7. Hypertension: Reasonably controlled.   Procedures:  See Below.   Consultations: Dr Lina Sar, GI.  Dr Su Grand, Urology.    Discharge Exam: Filed Vitals:   10/07/12 1300  BP: 158/57  Pulse: 77  Temp: 98.3 F (36.8 C)  Resp: 16    General: Comfortable, alert, communicative, fully oriented, not short of breath at rest.  HEENT: Mild clinical pallor, no jaundice, no conjunctival injection or discharge.  NECK: Supple, JVP not seen, no carotid bruits, no palpable lymphadenopathy, no palpable goiter.  CHEST: Clinically clear to auscultation, no wheezes, no crackles.  HEART: Sounds 1 and 2 heard, normal, regular, no murmurs.  ABDOMEN: Full, soft, non-tender, no palpable organomegaly, no palpable masses, normal bowel sounds.  GENITALIA: Not examined.  LOWER EXTREMITIES: No pitting edema, palpable peripheral pulses.  MUSCULOSKELETAL SYSTEM: Generalized osteoarthritic changes, otherwise, normal.  CENTRAL NERVOUS SYSTEM: No focal neurologic deficit on gross examination.   Discharge Instructions      Discharge Orders   Future Appointments Provider Department Dept Phone   11/12/2012 2:15 PM Hart Carwin, MD [facility] [phone]   Future Orders Complete By Expires   Diet - low sodium heart healthy  As directed    Diet Carb Modified  As directed    Increase activity slowly  As directed        Medication List         acetaminophen 325 MG tablet  Commonly known as:  TYLENOL  Take 325 mg by mouth every 4 (four) hours as needed for pain. Per bottle     Biotin 5000 MCG Caps  Take 5,000 mcg by mouth daily.     doxazosin 8 MG tablet  Commonly known as:  CARDURA  Take 8 mg by mouth daily.     enoxaparin 120 MG/0.8ML injection  Commonly known as:  LOVENOX  Inject 0.73 mL (110 mg total) into  the skin every 12 (twelve) hours.     escitalopram 20 MG tablet  Commonly known as:  LEXAPRO  Take 1 tablet (20 mg total) by mouth daily.     famotidine 20 MG tablet  Commonly known as:  PEPCID  Take 20 mg by mouth daily as needed for heartburn.     furosemide 40 MG tablet  Commonly known as:  LASIX  Take 40 mg by mouth daily.     insulin glargine 100 UNIT/ML injection  Commonly known as:  LANTUS  Inject 0.2 mL (20 Units total) into the skin every morning.     levothyroxine 88 MCG tablet  Commonly known as:  SYNTHROID, LEVOTHROID  Take 88 mcg by mouth daily.     loratadine 10 MG tablet  Commonly known as:  CLARITIN  Take 10 mg by mouth daily as needed for allergies.     losartan 25 MG tablet  Commonly known as:  COZAAR  Take 25 mg by mouth daily.     metFORMIN 1000 MG tablet  Commonly known as:  GLUCOPHAGE  Take 1,000 mg by mouth 2 (two) times daily with a meal.     pantoprazole 40 MG tablet  Commonly known as:  PROTONIX  Take 40 mg by mouth daily.     potassium chloride 20 MEQ packet  Commonly known as:  KLOR-CON  Take 20 mEq by mouth daily.     pregabalin 75 MG capsule  Commonly known as:  LYRICA  1 cap every morning and 1 at bedtime     simvastatin 20 MG tablet  Commonly known as:  ZOCOR  Take 1 tablet (20 mg total) by mouth at bedtime.     TOVIAZ 4 MG Tb24 tablet  Generic drug:  fesoterodine  Take 4 mg by mouth at bedtime.     traMADol 50 MG tablet  Commonly known as:  ULTRAM  Take 1 tablet (50 mg total) by mouth every 6 (six) hours as needed.     traZODone 100 MG tablet  Commonly known as:  DESYREL  Take 100 mg by mouth at bedtime. Take 2 by mouth at bedtime     traZODone 50 MG tablet  Commonly known as:  DESYREL  Take 1 tablet (50 mg total) by mouth at bedtime.     VICTOZA 18 MG/3ML Soln injection  Generic drug:  Liraglutide  Inject 1.8 mg into the skin daily. 1.8mg  subcutaneous every morning     warfarin 5 MG tablet  Commonly known as:   COUMADIN  Take 2 tablets (10 mg total) by mouth daily.       Allergies  Allergen Reactions  . Oxycodone-Acetaminophen Nausea Only  . Zithromax [Azithromycin]     thrush   Follow-up Information   Follow up with Lina Sar, MD On 11/12/2012. (2:15 PM)    Specialty:  Gastroenterology   Contact information:   [address] [place] [address] [phone]       Follow up with [facility] ENDOSCOPY On 10/17/2012. (Dr Christella Hartigan office will call you with details but time will be in afternoon.  this is for endoscopic ultrasound.  Dr Christella Hartigan is in same offic with Dr Juanda Chance.  )    Contact information:   [address] [place] [id number] [place] [address] [phone]      Follow up with Ruthe Mannan, MD.   Specialty:  [facility] Medicine   Contact information:   [address] [place] [address] [phone]       Call NESI,MARC-HENRY, MD.   Specialty:  Urology   Contact information:   [address] [place] [address] [phone]        The results of significant diagnostics from this hospitalization (including imaging, microbiology, ancillary and laboratory) are listed below for reference.    Significant Diagnostic Studies: Dg Chest 2 View  10/01/2012   *RADIOLOGY REPORT*  Clinical Data: Epigastric pain  CHEST - 2 VIEW  Comparison: Radiograph 11/21 1011  Findings: Normal cardiac silhouette.  There is mild scarring in the lingula unchanged from prior.  No effusion, infiltrate, or pneumothorax. Degenerative osteophytosis of the thoracic spine.  IMPRESSION: No acute cardiopulmonary process.   Original Report Authenticated By: Genevive Bi, M.D.   Ct Angio Abdomen W/cm &/or Wo Contrast  10/03/2012   **ADDENDUM** CREATED: 10/03/2012 13:31:08  There is a second 1.5 cm lesion in the head of the pancreas that is indeterminate.  MRI the pancreas is recommended to further characterize.  Alternatively,   trans duodenal ultrasound biopsy is also a consideration.  **END ADDENDUM** SIGNED BY: Marlowe Aschoff. Hoss, M.D.  10/03/2012   *RADIOLOGY REPORT*  Clinical Data:  Portal vein thrombosis  CT ANGIOGRAPHY ABDOMEN  Technique:  Multidetector CT imaging of the abdomen was performed using the standard protocol during bolus administration of  intravenous contrast.  Multiplanar reconstructed images including MIPs were obtained and reviewed to evaluate the vascular anatomy.  Contrast: OMNIPAQUE IOHEXOL 350 MG/ML SOLN  Comparison:  10/01/2012  Findings:  There is a stable 1.2 cm lesion in the body of the pancreas on image 80 series 3.  It is relatively hypovascular and may simply represent a cyst but it is too small to characterize.  Stable portal vein thrombosis extending from the portal vein confluence, occluding the entire length of the portal vein, and extending into the superior mesenteric vein as well as some tributaries.  The splenic vein is patent.  Renal veins are patent. IVC is patent.  Aorta is nonaneurysmal and patent with mild atherosclerotic change.  Celiac axis is patent.  Branch vessels are patent.  Accessory left hepatic artery.  SMA is patent with mild atherosclerotic change.  Branch vessels are patent.  IMA is patent.  Branch vessels are diminutive and patent.  Single renal arteries are patent.  Diffuse hepatic steatosis  Cholelithiasis  Several small para-aortic lymph nodes are noted.  None are greater than 1 cm short axis diameter.  Stable right hydronephrosis with a slight delay in contrast excretion from the right kidney.  There is stranding noted about the thrombosed portal vein compatible with inflammatory changes.  Small amount of free fluid about the liver.  Diffuse hepatic steatosis.  Spleen and adrenal glands are unremarkable.  Small bilateral pleural effusions and dependent atelectasis.  Advanced degenerative changes in the lumbar spine without compression deformity.  Review of the MIP images  confirms the above findings.  IMPRESSION: Stable portal vein thrombosis as described.  There are associated inflammatory changes.  Stable 1.2 cm hypodensity in the body of the pancreas.  26-month follow-up is recommended as cystic neoplasm is not entirely excluded.  Small pleural effusions have enlarged.  Small amount of free fluid is now more noticeable about the liver.  Stable cholelithiasis.  Stable right hydronephrosis and slightly delayed contrast excretion.  These are secondary findings of partial right ureteral obstruction.  No significant obstruction in the arterial vasculature.   Original Report Authenticated By: Jolaine Click, M.D.   US Abdomen Complete  10/01/2012   *RADIOLOGY REPORT*  Clinical Data:  Abdominal pain, fatigue and right flank pain  COMPLETE ABDOMINAL ULTRASOUND  Comparison:  None.  Findings:  Gallbladder:  There are multiple dependent gallstones in a distended gallbladder.  There is no wall thickening or pericholecystic fluid.  There is no evidence of acute cholecystitis.  Common bile duct:  The common bile duct is mildly dilated measuring just over 8 mm.  No duct stone is seen.  The distal duct is not well visualized.  Liver:  The liver is echogenic consistent with diffuse fatty infiltration.  It is also somewhat heterogeneous in echogenicity, but no discrete mass or focal lesion is seen.  IVC:  Appears normal.  Pancreas:  Limited visualization.  Portions of the distal tail and inferior head were obscured by bowel gas.  The portions seen are echogenic but otherwise unremarkable.  Spleen:  Normal in size measuring 8.9 cm.  Right Kidney:  Moderate right hydronephrosis with dilated of the proximal right ureter.  No right renal mass or stone. Right kidney measures 13.5 cm in length.  Left Kidney:  No hydronephrosis.  Cyst arises from the mid pole measuring 3 cm.  No other renal masses.  Left kidney measures 12.1 cm in length.  Abdominal aorta:  Aorta not well seen due to overlying bowel gas.  Bladder was imaged to evaluate for ureteral jets.  Ureteral jets were visualized.  IMPRESSION: Multiple gallstones but no evidence of acute cholecystitis.  Mild dilation of the common bile duct.  This most likely is chronic.  Consider a distal duct stone if there are symptoms consistent with this.  The distal duct was not visualized on the study.  Moderate right hydronephrosis.  The etiology of this is unclear. This would be better evaluated with abdominal pelvic CT.  Fatty infiltration of the liver.   Original Report Authenticated By: Amie Portland, M.D.   Ct Abdomen Pelvis W Contrast  10/01/2012   *RADIOLOGY REPORT*  Clinical Data: Abdominal pain, right flank pain  CT ABDOMEN AND PELVIS WITH CONTRAST  Technique:  Multidetector CT imaging of the abdomen and pelvis was performed following the standard protocol during bolus administration of intravenous contrast.  Contrast: OMNIPAQUE IOHEXOL 300 MG/ML  SOLN  Comparison: Abdominal ultrasound 10/01/2012, report of prior CT 11/17/1997 images not digitized, lumbar spine radiographs 11/03/2011.  Findings: Motion degrades imaging at the lung bases.  4 mm pleural parenchymal right lower lobe nodule image 3 is noted.  Trace pleural effusions.  Curvilinear bilateral lower lobe presumed atelectasis or scarring.  Trace perihepatic fluid is identified tracking along the pericolic gutter to the pelvis.  Gallstones are re-identified.  There is portal venous thrombosis with extension to the superior mesenteric vein and surrounding perivenous stranding.  For example, see image 40. Celiac axis, superior mesenteric artery, and IMA are patent allowing for technique.  The splenic vein is patent up to the portosplenic confluence.  There is moderate right hydroureteronephrosis with ureteral decompression as it crosses over the iliac vessels, for example image 74.  No extrinsic or intrinsic mass is identified allowing for technique.  No radiopaque renal, ureteral, or bladder  calculus. Renal enhancement is normal with excretion into the collecting systems.  No perinephric stranding or fluid.  There is an ill-defined 1.2 cm hypodense lesion within the head of the pancreas image 41.  No specific peripancreatic stranding separate from that seen adjacent to the portal vein.  5mm similar hypodense lesion body of pancreas. No pancreatic dilatation.  Spleen and adrenal glands are normal.  Too small to characterize upper renal pole cortical hypodensities noted with left mid renal cortical 2.9 cm cyst.  Uterus demonstrate calcifications which could be related to involuted fibroid or vascular calcifications. The ovaries are normal.  No lymphadenopathy or free air.  No bowel wall thickening or focal segmental dilatation.  No acute osseous finding. Degenerative changes are noted in the spine.  Lower thoracic mild compression deformities are stable.  IMPRESSION: Addition of multiple complex findings as above.  The most acute appearing finding is the portal venous thrombosis with surrounding stranding and extension to the superior mesenteric vein, without evidence for bowel compromise. Occult bowel ischemia or enteritis may not be apparent on CT.  Right moderate hydroureteronephrosis is noted without other complicating features such as calculus; there was mild hydronephrosis described on the remote prior exam and this could represent stricture or other longstanding finding but an intrinsic mass or nonradiopaque calculus could have this appearance.  Hypodense pancreatic lesions which are incompletely characterized. The patient reportedly has no evidence for pancreatitis, for which there could also be association with portal venous thrombosis.  Gallstones without other evidence for acute cholecystitis.  Dedicated non emergent pancreatic and renal imaging are recommended, with modalities tailored to the patient's current course of treatment and clinical condition.  These results were called  by telephone  on 10/01/2012 at 11:20 p.m. to Dr. Trellis Moment, who verbally acknowledged these results.   Original Report Authenticated By: Christiana Pellant, M.D.    Microbiology: No results found for this or any previous visit (from the past 240 hour(s)).   Labs: Basic Metabolic Panel:  Recent Labs Lab 10/01/12 1705 10/02/12 0515 10/03/12 0620 10/04/12 0600 10/06/12 0142  NA 141 142 142 141 141  K 3.7 3.3* 3.8 3.6 3.2*  CL 102 105 108 106 106  CO2 29 29 26 23 26   GLUCOSE 169* 80 122* 135* 146*  BUN 9 7 6 6 6   CREATININE 0.71 0.73 0.62 0.64 0.54  CALCIUM 9.6 8.9 9.0 9.2 8.7   Liver Function Tests:  Recent Labs Lab 10/01/12 1705 10/02/12 0515 10/04/12 0600 10/06/12 0142  AST 21 19 24 16   ALT 14 11 23 17   ALKPHOS 56 44 47 42  BILITOT 0.2* 0.3 0.4 0.4  PROT 7.1 5.9* 6.3 6.2  ALBUMIN 3.6 3.0* 3.3* 2.9*    Recent Labs Lab 10/01/12 1705 10/02/12 0515  LIPASE 56 33   No results found for this basename: AMMONIA,  in the last 168 hours CBC:  Recent Labs Lab 10/01/12 1705 10/02/12 0515 10/03/12 0620 10/04/12 0600 10/05/12 0600 10/06/12 0142  WBC 6.5 6.4 5.8 8.0 6.6 6.0  NEUTROABS 4.6 3.5  --   --   --   --   HGB 10.7* 9.2* 9.5* 9.8* 9.5* 9.4*  HCT 32.6* 28.6* 29.6* 30.5* 29.1* 28.7*  MCV 84.9 84.6 84.8 83.8 83.6 83.2  PLT 220 209 202 203 187 174   Cardiac Enzymes: No results found for this basename: CKTOTAL, CKMB, CKMBINDEX, TROPONINI,  in the last 168 hours BNP: BNP (last 3 results) No results found for this basename: PROBNP,  in the last 8760 hours CBG:  Recent Labs Lab 10/06/12 1036 10/06/12 1617 10/06/12 2106 10/07/12 0640 10/07/12 1108  GLUCAP 178* 146* 180* 139* 155*       Signed:  Akif Weldy,CHRISTOPHER  [facility] 10/07/2012, 1:20 PM

## 2012-10-07 ENCOUNTER — Encounter (HOSPITAL_COMMUNITY): Payer: Self-pay | Admitting: Pharmacy Technician

## 2012-10-07 ENCOUNTER — Encounter: Payer: Self-pay | Admitting: *Deleted

## 2012-10-07 ENCOUNTER — Other Ambulatory Visit: Payer: Self-pay

## 2012-10-07 ENCOUNTER — Telehealth: Payer: Self-pay

## 2012-10-07 DIAGNOSIS — K8689 Other specified diseases of pancreas: Secondary | ICD-10-CM

## 2012-10-07 DIAGNOSIS — I10 Essential (primary) hypertension: Secondary | ICD-10-CM

## 2012-10-07 DIAGNOSIS — E1149 Type 2 diabetes mellitus with other diabetic neurological complication: Secondary | ICD-10-CM

## 2012-10-07 LAB — GLUCOSE, CAPILLARY: Glucose-Capillary: 139 mg/dL — ABNORMAL HIGH (ref 70–99)

## 2012-10-07 MED ORDER — TRAMADOL HCL 50 MG PO TABS: 50.0000 mg | ORAL_TABLET | Freq: Four times a day (QID) | ORAL | Status: DC | PRN

## 2012-10-07 MED ORDER — WARFARIN SODIUM 10 MG PO TABS
10.0000 mg | ORAL_TABLET | Freq: Once | ORAL | Status: DC
  Filled 2012-10-07: qty 1

## 2012-10-07 MED ORDER — ENOXAPARIN SODIUM 120 MG/0.8ML ~~LOC~~ SOLN: 110.0000 mg | Freq: Two times a day (BID) | SUBCUTANEOUS | Status: DC

## 2012-10-07 MED ORDER — WARFARIN SODIUM 5 MG PO TABS: 10.0000 mg | ORAL_TABLET | Freq: Every day | ORAL | Status: DC

## 2012-10-07 MED ORDER — TRAZODONE HCL 50 MG PO TABS: 50.0000 mg | ORAL_TABLET | Freq: Every day | ORAL | Status: DC

## 2012-10-07 MED ORDER — INSULIN GLARGINE 100 UNIT/ML ~~LOC~~ SOLN: 20.0000 [IU] | SUBCUTANEOUS | Status: DC

## 2012-10-07 NOTE — Telephone Encounter (Signed)
Yes, please keep her on for that date. (60 min, radial +/- linear, ++ MAC, for abnormal pancreas) She is newly started on coumadin which will have to be held and she will need lovenox "bridge." can you please contact Maralyn Sago on Monday to make sure the lovenox bridge is set up at her time of discharge from the hosp (probably in next few days from what I can tell from chart review). Thanks ----- Message ----- From: Donata Duff, CMA Sent: 10/04/2012 9:03 AM To: Rachael Fee, MD Dr Christella Hartigan I got a call from Maralyn Sago about this pt needing an EUS I have penciled her in for 10/17/12 in case we need it.

## 2012-10-07 NOTE — Progress Notes (Signed)
ANTICOAGULATION CONSULT NOTE - Follow Up Consult  Pharmacy Consult for Lovenox/Coumadin Indication: Portal vein clot  Allergies  Allergen Reactions  . Oxycodone-Acetaminophen Nausea Only  . Zithromax [Azithromycin]     thrush    Patient Measurements: Height: 5' 6.53" (169 cm) Weight: 236 lb 15.9 oz (107.5 kg) IBW/kg (Calculated) : 60.53 Heparin Dosing Weight:    Vital Signs: Temp: 98.5 F (36.9 C) (08/18 0525) BP: 146/64 mmHg (08/18 0525) Pulse Rate: 81 (08/18 0525)  Labs:  Recent Labs  10/05/12 0600 10/05/12 1733 10/06/12 0142 10/07/12 0740  HGB 9.5*  --  9.4*  --   HCT 29.1*  --  28.7*  --   PLT 187  --  174  --   LABPROT 14.4  --  14.7 13.8  INR 1.14  --  1.17 1.08  HEPARINUNFRC 0.25* 0.34 0.27*  --   CREATININE  --   --  0.54  --     Estimated Creatinine Clearance: 79.6 ml/min (by C-G formula based on Cr of 0.54).  Assessment: 89 YOF admitted with abdominal pain- found to have a lesion on pancreas, blood clot in portal vein and gall stones.  AC: LMW/Coumadin day #5overlap, INR down to 1.08  not budging. Dose charted last pm.  Goal of Therapy:  INR 2-3 Monitor platelets by anticoagulation protocol: Yes   Plan:  Lovenox 110mg  sq q 12h. CBC every 72h Coumadin 10mg  po x 1 tonight. Daily INR   Ianna Salmela S. Merilynn Finland, PharmD, BCPS Clinical Staff Pharmacist Pager [phone]  Misty Stanley Stillinger 10/07/2012,12:53 PM

## 2012-10-07 NOTE — Telephone Encounter (Signed)
Pt has been scheduled for 10/17/12 145 pm  Theresa Summers will notify the pt and set up lovenox bridge

## 2012-10-07 NOTE — Telephone Encounter (Signed)
Message copied by Donata Duff on Mon Oct 07, 2012  2:32 PM ------      Message from: Dianah Field      Created: Mon Oct 07, 2012  1:59 PM      Regarding: coumadin/Lovenox       The Coumadin and Lovenox bridge will be managed out of [facility].      Christina Carlena Sax) Lenise Arena RN will oversee Lovenox bridge if needed pre EUS.      Pt and Carlena Sax are both aware that Coumadin will be stopped after the August 23rd dose.       Carlena Sax will plan to bring pt in for INR check around 8/25 and determine if she needs to start back on Lovenox (Dr Christella Hartigan indicates he would like for Lovenox to be re-initiated as indicated).  Carlena Sax is also aware that, if pt is back on Lovenox, it needs to be held after the AM shot of 8/27 so that sh can safely undergo the EUS/FNA of pancreatic mass on 8/28.            Thank you, Jennye Moccasin PA-C ------

## 2012-10-07 NOTE — Telephone Encounter (Signed)
Dr Christella Hartigan is aware

## 2012-10-08 ENCOUNTER — Encounter (HOSPITAL_COMMUNITY): Payer: Self-pay | Admitting: *Deleted

## 2012-10-10 ENCOUNTER — Ambulatory Visit (INDEPENDENT_AMBULATORY_CARE_PROVIDER_SITE_OTHER): Payer: 59 | Admitting: Family Medicine

## 2012-10-10 ENCOUNTER — Other Ambulatory Visit: Payer: Self-pay | Admitting: Family Medicine

## 2012-10-10 ENCOUNTER — Other Ambulatory Visit: Payer: 59

## 2012-10-10 DIAGNOSIS — Z7901 Long term (current) use of anticoagulants: Secondary | ICD-10-CM

## 2012-10-10 LAB — POCT INR: INR: 1.4

## 2012-10-10 MED ORDER — ENOXAPARIN SODIUM 120 MG/0.8ML ~~LOC~~ SOLN: 110.0000 mg | Freq: Two times a day (BID) | SUBCUTANEOUS | Status: DC

## 2012-10-10 NOTE — Telephone Encounter (Signed)
Pt was recently d/c'd from hospital with portal vein thrombosis.  Pt was taking Lorazepam 1mg  tablet PRN at bedtime for sleep during her hospital stay.  She would like to know if Dr. Dayton Martes can prescribe this for her now as she is not able to sleep at night since her d/c.

## 2012-10-10 NOTE — Patient Instructions (Signed)
8/21- Take Lovenox AM/PM, Take Coumadin 10mg  8/22- Take Lovenox AM/PM, Take Coumadin 10mg  8/23- Take Lovenox AM/PM, Take Coumadin 10mg  8/24- NO Coumadin, NO Lovenox 8/25- Take Lovenox AM/PM, NO Coumadin 8/26- Take Lovenox AM/PM, NO Coumadin 8/27- Take Lovenox AM ONLY 8/28- PROCEDURE DAY, NO Coumadin and NO Lovenox 8/29- Take Lovenox AM/PM, Take 1 1/2 tablets Coumadin 8/30- Take Lovenox AM/PM, Take 1 1/2 tablets Coumadin 8/31- Take Lovenox AM/PM, Take 1 tablet Coumadin 9/1- Take Lovenox AM/PM, take 1 tablet Coumadin 9/2- RECHECK INR AT COUMADIN CLINIC (do not take Lovenox or Coumadin before appt)

## 2012-10-10 NOTE — Telephone Encounter (Signed)
That is a very high dose.  I would much prefer to take a lower dose and only as needed.  Please call in rx for lorazepam 0.25 mg- 1- 2 tab po qhs prn insomnia.  #30 with no refills.

## 2012-10-10 NOTE — Telephone Encounter (Signed)
Advised patient as instructed.  She is agreeable to lower dose.   Cant find .25 mg dose in epic.  Please advise.

## 2012-10-11 ENCOUNTER — Other Ambulatory Visit: Payer: 59

## 2012-10-11 MED ORDER — LORAZEPAM 0.5 MG PO TABS: ORAL_TABLET | ORAL | Status: DC

## 2012-10-11 NOTE — Telephone Encounter (Signed)
Script called to pharmacy

## 2012-10-11 NOTE — Telephone Encounter (Signed)
Please call in as entered below. 

## 2012-10-17 ENCOUNTER — Encounter (HOSPITAL_COMMUNITY): Payer: Self-pay

## 2012-10-17 ENCOUNTER — Encounter (HOSPITAL_COMMUNITY)
Admission: RE | Disposition: A | Discharge status: Home / Self Care | Payer: Self-pay | Source: Ambulatory Visit | Attending: Gastroenterology

## 2012-10-17 ENCOUNTER — Ambulatory Visit (HOSPITAL_COMMUNITY): Payer: 59 | Admitting: Anesthesiology

## 2012-10-17 ENCOUNTER — Ambulatory Visit (HOSPITAL_COMMUNITY)
Admission: RE | Admit: 2012-10-17 | Discharge: 2012-10-17 | Disposition: A | Discharge status: Home / Self Care | Payer: 59 | Source: Ambulatory Visit | Attending: Gastroenterology | Admitting: Gastroenterology

## 2012-10-17 ENCOUNTER — Encounter (HOSPITAL_COMMUNITY): Payer: Self-pay | Admitting: Anesthesiology

## 2012-10-17 DIAGNOSIS — D509 Iron deficiency anemia, unspecified: Secondary | ICD-10-CM | POA: Insufficient documentation

## 2012-10-17 DIAGNOSIS — K869 Disease of pancreas, unspecified: Secondary | ICD-10-CM

## 2012-10-17 DIAGNOSIS — K8020 Calculus of gallbladder without cholecystitis without obstruction: Secondary | ICD-10-CM | POA: Insufficient documentation

## 2012-10-17 DIAGNOSIS — C801 Malignant (primary) neoplasm, unspecified: Secondary | ICD-10-CM | POA: Insufficient documentation

## 2012-10-17 DIAGNOSIS — K7689 Other specified diseases of liver: Secondary | ICD-10-CM | POA: Insufficient documentation

## 2012-10-17 DIAGNOSIS — C250 Malignant neoplasm of head of pancreas: Secondary | ICD-10-CM | POA: Insufficient documentation

## 2012-10-17 DIAGNOSIS — N1330 Unspecified hydronephrosis: Secondary | ICD-10-CM | POA: Insufficient documentation

## 2012-10-17 DIAGNOSIS — Z7901 Long term (current) use of anticoagulants: Secondary | ICD-10-CM | POA: Insufficient documentation

## 2012-10-17 DIAGNOSIS — J90 Pleural effusion, not elsewhere classified: Secondary | ICD-10-CM | POA: Insufficient documentation

## 2012-10-17 DIAGNOSIS — I81 Portal vein thrombosis: Secondary | ICD-10-CM | POA: Insufficient documentation

## 2012-10-17 DIAGNOSIS — K8689 Other specified diseases of pancreas: Secondary | ICD-10-CM

## 2012-10-17 HISTORY — DX: Malignant (primary) neoplasm, unspecified: C80.1

## 2012-10-17 HISTORY — PX: EUS: SHX5427

## 2012-10-17 LAB — GLUCOSE, CAPILLARY
Glucose-Capillary: 76 mg/dL (ref 70–99)
Glucose-Capillary: 73 mg/dL (ref 70–99)

## 2012-10-17 SURGERY — UPPER ENDOSCOPIC ULTRASOUND (EUS) LINEAR
Anesthesia: Monitor Anesthesia Care

## 2012-10-17 MED ORDER — BUTAMBEN-TETRACAINE-BENZOCAINE 2-2-14 % EX AERO
INHALATION_SPRAY | CUTANEOUS | Status: DC | PRN
  Administered 2012-10-17: 2 via TOPICAL

## 2012-10-17 MED ORDER — PROPOFOL 10 MG/ML IV BOLUS
INTRAVENOUS | Status: DC | PRN
  Administered 2012-10-17: 20 mg via INTRAVENOUS
  Administered 2012-10-17: 40 mg via INTRAVENOUS
  Administered 2012-10-17 (×6): 20 mg via INTRAVENOUS
  Administered 2012-10-17: 40 mg via INTRAVENOUS
  Administered 2012-10-17 (×2): 20 mg via INTRAVENOUS

## 2012-10-17 MED ORDER — MIDAZOLAM HCL 5 MG/5ML IJ SOLN
INTRAMUSCULAR | Status: DC | PRN
  Administered 2012-10-17 (×2): 1 mg via INTRAVENOUS

## 2012-10-17 MED ORDER — ONDANSETRON HCL 4 MG/2ML IJ SOLN
INTRAMUSCULAR | Status: DC | PRN
  Administered 2012-10-17 (×2): 2 mg via INTRAVENOUS

## 2012-10-17 MED ORDER — KETAMINE HCL 10 MG/ML IJ SOLN
INTRAMUSCULAR | Status: DC | PRN
  Administered 2012-10-17: 20 mg via INTRAVENOUS
  Administered 2012-10-17: 10 mg via INTRAVENOUS

## 2012-10-17 MED ORDER — FENTANYL CITRATE 0.05 MG/ML IJ SOLN
INTRAMUSCULAR | Status: DC | PRN
  Administered 2012-10-17 (×2): 50 ug via INTRAVENOUS

## 2012-10-17 MED ORDER — LACTATED RINGERS IV SOLN
INTRAVENOUS | Status: DC
  Administered 2012-10-17: 1000 mL via INTRAVENOUS

## 2012-10-17 MED ORDER — SODIUM CHLORIDE 0.9 % IV SOLN: INTRAVENOUS | Status: DC

## 2012-10-17 NOTE — Anesthesia Preprocedure Evaluation (Addendum)
Anesthesia Evaluation  Patient identified by MRN, date of birth, ID band Patient awake    Reviewed: Allergy & Precautions, H&P , NPO status , Patient's Chart, lab work & pertinent test results  Airway Mallampati: II TM Distance: >3 FB Neck ROM: full    Dental no notable dental hx. (+) Dental Advisory Given and Teeth Intact   Pulmonary shortness of breath and with exertion,  breath sounds clear to auscultation  Pulmonary exam normal       Cardiovascular hypertension, Pt. on medications + Peripheral Vascular Disease Rhythm:regular Rate:Normal     Neuro/Psych negative neurological ROS  negative psych ROS   GI/Hepatic negative GI ROS, Neg liver ROS, GERD-  ,  Endo/Other  diabetes, Type 2, Insulin Dependent and Oral Hypoglycemic AgentsHypothyroidism Morbid obesity  Renal/GU Renal diseasenegative Renal ROS  negative genitourinary   Musculoskeletal negative musculoskeletal ROS (+)   Abdominal (+) + obese,   Peds  Hematology negative hematology ROS (+)   Anesthesia Other Findings   Reproductive/Obstetrics negative OB ROS                         Anesthesia Physical Anesthesia Plan  ASA: III  Anesthesia Plan: MAC   Post-op Pain Management:    Induction:   Airway Management Planned:   Additional Equipment:   Intra-op Plan:   Post-operative Plan:   Informed Consent: I have reviewed the patients History and Physical, chart, labs and discussed the procedure including the risks, benefits and alternatives for the proposed anesthesia with the patient or authorized representative who has indicated his/her understanding and acceptance.   Dental advisory given  Plan Discussed with: CRNA and Surgeon  Anesthesia Plan Comments:        Anesthesia Quick Evaluation

## 2012-10-17 NOTE — Anesthesia Postprocedure Evaluation (Signed)
  Anesthesia Post-op Note  Patient: Theresa Summers  Procedure(s) Performed: Procedure(s) (LRB): UPPER ENDOSCOPIC ULTRASOUND (EUS) LINEAR (N/A)  Patient Location: PACU  Anesthesia Type: MAC  Level of Consciousness: awake and alert   Airway and Oxygen Therapy: Patient Spontanous Breathing  Post-op Pain: mild  Post-op Assessment: Post-op Vital signs reviewed, Patient's Cardiovascular Status Stable, Respiratory Function Stable, Patent Airway and No signs of Nausea or vomiting  Last Vitals:  Filed Vitals:   10/17/12 1517  BP:   Pulse:   Temp: 36.4 C    Post-op Vital Signs: stable   Complications: No apparent anesthesia complications

## 2012-10-17 NOTE — Interval H&P Note (Signed)
History and Physical Interval Note:  10/17/2012 1:57 PM  Theresa Summers  has presented today for surgery, with the diagnosis of Pancreatic mass [577.9]  The various methods of treatment have been discussed with the patient and family. After consideration of risks, benefits and other options for treatment, the patient has consented to  Procedure(s): UPPER ENDOSCOPIC ULTRASOUND (EUS) LINEAR (N/A) as a surgical intervention .  The patient's history has been reviewed, patient examined, no change in status, stable for surgery.  I have reviewed the patient's chart and labs.  Questions were answered to the patient's satisfaction.     Rob Bunting

## 2012-10-17 NOTE — Op Note (Signed)
[facility] [address] [place], [address]   ENDOSCOPIC ULTRASOUND PROCEDURE REPORT  PATIENT: Theresa Summers, Theresa Summers  MR#: [id number] BIRTHDATE: 12-25-1940  GENDER: Female ENDOSCOPIST: Rachael Fee, MD REFERRED BY:  Hart Carwin, M.D. PROCEDURE DATE:  10/17/2012 PROCEDURE:   Upper EUS w/FNA ASA CLASS:      Class III INDICATIONS:   acute portal vein thrombosis 2 weeks ago; imaging at the time also showed 2 masses in pancreas (head and body), CA 19-9 550. MEDICATIONS: MAC sedation, administered by CRNA  DESCRIPTION OF PROCEDURE:   After the risks benefits and alternatives of the procedure were  explained, informed consent was obtained. The patient was then placed in the left, lateral, decubitus postion and IV sedation was administered. Throughout the procedure, the patients blood pressure, pulse and oxygen saturations were monitored continuously.  Under direct visualization, the Pentax Radial EUS [id number]  endoscope was introduced through the mouth  and advanced to the second portion of the duodenum .  Water was used as necessary to provide an acoustic interface.  Upon completion of the imaging, water was removed and the patient was sent to the recovery room in satisfactory condition.  Endoscopic findings (limited views with echoendoscopes): 1. Normal UGI tract EUS findings: 1. Round 1.8cm by 1.6cm hypoechoic, heterogenous mass in head of pancreas that does not appear to involve nearby portal vein or SMV. The mass was sampled with 3 passes of a 22 guage EUS FNA needle under suction. 2. Round, anechoic 8mm cystic lesion in body of pancreas without internal solid masses.  This does not involve any nearby blood vessels. The fluid was completely aspirated and sent for cytology (3cc of thin, clear fluid). 3. Pancreatic parenchyma was otherwise normal appearing. 4. Main pancreatic duct was normal, non-dilated 5. CBD was normal; non-dilated 6. No peripancreatic  adenopathy  Impression: 1.8cm by 1.6cm solid mass in head of pancreas without any direct involvement with portal vein, SMV, SMA.  Preliminary cytology review from this mass was positive for malignancy (likely adenocarcinoma, however awaiting final review).  Small cyst in body of pancreas was also sampled and this is unlikely a malignant cyst. Her recent acute portal vein thrombosis may be due to malignancy related hypercoagulable state since the head of pancreas mass does not appear to involve the portal vein directly.  _______________________________ eSigned:  Rachael Fee, MD 10/17/2012 3:15 PM

## 2012-10-17 NOTE — Transfer of Care (Signed)
Immediate Anesthesia Transfer of Care Note  Patient: Theresa Summers  Procedure(s) Performed: Procedure(s) (LRB): UPPER ENDOSCOPIC ULTRASOUND (EUS) LINEAR (N/A)  Patient Location: PACU  Anesthesia Type: MAC  Level of Consciousness: sedated, patient cooperative and responds to stimulaton  Airway & Oxygen Therapy: Patient Spontanous Breathing and Patient connected to face mask oxgen  Post-op Assessment: Report given to PACU RN and Post -op Vital signs reviewed and stable  Post vital signs: Reviewed and stable  Complications: No apparent anesthesia complications

## 2012-10-17 NOTE — H&P (View-Only) (Signed)
[facility] Progress Note   Subjective  Eating breakfast, had some pain after supper last night   Objective     Vital signs in last 24 hours: Temp:  [97.7 F (36.5 C)-99.5 F (37.5 C)] 99.5 F (37.5 C) (08/15 0522) Pulse Rate:  [69-77] 77 (08/15 0522) Resp:  [17-18] 18 (08/15 0522) BP: (147-148)/(64-78) 147/64 mmHg (08/15 0522) SpO2:  [96 %-98 %] 96 % (08/15 0522) Last BM Date: 10/03/12 General:    white female in NAD Heart:  Regular rate and rhythm; no murmurs Lungs: Respirations even and unlabored, lungs CTA bilaterally Abdomen:  Soft, nontender and nondistended. Normal bowel sounds.minimal epigastric tenderness, Extremities:  Without edema. Neurologic:  Alert and oriented,  grossly normal neurologically. Psych:  Cooperative. Normal mood and affect.  Intake/Output from previous day: 08/14 0701 - 08/15 0700 In: 1 [P.O.:1] Out: -  Intake/Output this shift:    Lab Results:  Recent Labs  10/02/12 0515 10/03/12 0620 10/04/12 0600  WBC 6.4 5.8 8.0  HGB 9.2* 9.5* 9.8*  HCT 28.6* 29.6* 30.5*  PLT 209 202 203   BMET  Recent Labs  10/02/12 0515 10/03/12 0620 10/04/12 0600  NA 142 142 141  K 3.3* 3.8 3.6  CL 105 108 106  CO2 29 26 23   GLUCOSE 80 122* 135*  BUN 7 6 6   CREATININE 0.73 0.62 0.64  CALCIUM 8.9 9.0 9.2   LFT  Recent Labs  10/04/12 0600  PROT 6.3  ALBUMIN 3.3*  AST 24  ALT 23  ALKPHOS 47  BILITOT 0.4   PT/INR  Recent Labs  10/03/12 2250 10/04/12 0600  LABPROT 13.6 13.7  INR 1.06 1.07    Studies/Results: Ct Angio Abdomen W/cm &/or Wo Contrast  10/03/2012   **ADDENDUM** CREATED: 10/03/2012 13:31:08  There is a second 1.5 cm lesion in the head of the pancreas that is indeterminate.  MRI the pancreas is recommended to further characterize.  Alternatively,  trans duodenal ultrasound biopsy is also a consideration.  **END ADDENDUM** SIGNED BY: Marlowe Aschoff. Hoss, M.D.  10/03/2012   *RADIOLOGY REPORT*  Clinical Data:  Portal  vein thrombosis  CT ANGIOGRAPHY ABDOMEN  Technique:  Multidetector CT imaging of the abdomen was performed using the standard protocol during bolus administration of intravenous contrast.  Multiplanar reconstructed images including MIPs were obtained and reviewed to evaluate the vascular anatomy.  Contrast: OMNIPAQUE IOHEXOL 350 MG/ML SOLN  Comparison:  10/01/2012  Findings:  There is a stable 1.2 cm lesion in the body of the pancreas on image 80 series 3.  It is relatively hypovascular and may simply represent a cyst but it is too small to characterize.  Stable portal vein thrombosis extending from the portal vein confluence, occluding the entire length of the portal vein, and extending into the superior mesenteric vein as well as some tributaries.  The splenic vein is patent.  Renal veins are patent. IVC is patent.  Aorta is nonaneurysmal and patent with mild atherosclerotic change.  Celiac axis is patent.  Branch vessels are patent.  Accessory left hepatic artery.  SMA is patent with mild atherosclerotic change.  Branch vessels are patent.  IMA is patent.  Branch vessels are diminutive and patent.  Single renal arteries are patent.  Diffuse hepatic steatosis  Cholelithiasis  Several small para-aortic lymph nodes are noted.  None are greater than 1 cm short axis diameter.  Stable right hydronephrosis with a slight delay in contrast excretion from the right kidney.  There is stranding noted  about the thrombosed portal vein compatible with inflammatory changes.  Small amount of free fluid about the liver.  Diffuse hepatic steatosis.  Spleen and adrenal glands are unremarkable.  Small bilateral pleural effusions and dependent atelectasis.  Advanced degenerative changes in the lumbar spine without compression deformity.  Review of the MIP images confirms the above findings.  IMPRESSION: Stable portal vein thrombosis as described.  There are associated inflammatory changes.  Stable 1.2 cm hypodensity in the body  of the pancreas.  62-month follow-up is recommended as cystic neoplasm is not entirely excluded.  Small pleural effusions have enlarged.  Small amount of free fluid is now more noticeable about the liver.  Stable cholelithiasis.  Stable right hydronephrosis and slightly delayed contrast excretion.  These are secondary findings of partial right ureteral obstruction.  No significant obstruction in the arterial vasculature.   Original Report Authenticated By: Jolaine Click, M.D.       Assessment:  Acute portal vein thrombosis,  symptomatically improved on Hepatin infusion, no clinical evidence of ischemic bowl, Pancreatic head mass, will need EUS with FNA  As an outpatient Right hydroureter- as per Dr Brunilda Payor- will need outpatient evaluation Iron deficiency anemia, heme negative stools- will be evaluated on outpatient basis Long term anticoagulation x 6 months, Coumadin started last night and will be monitored by Pharmacy     Plan:   Requests something for sleep Aitvan 1mg  hs prn Switch to oral pain meds Protime daily- discharge when INR >2.0, plan Coumadin x 6 months Gaviscon prn indigestion Arrange EUS with Dr Christella Hartigan nurse, let him review the CT scan when he returns  On 10/07/2012 ( will have to come off Coumadin) OVwith me in next 2 weeks after EUS completed to discuss diagnosis.     LOS: 3 days   Lina Sar  10/04/2012, 7:45 AM

## 2012-10-18 ENCOUNTER — Encounter (HOSPITAL_COMMUNITY): Payer: Self-pay | Admitting: Gastroenterology

## 2012-10-18 ENCOUNTER — Telehealth: Payer: Self-pay

## 2012-10-18 ENCOUNTER — Telehealth: Payer: Self-pay | Admitting: *Deleted

## 2012-10-18 DIAGNOSIS — C259 Malignant neoplasm of pancreas, unspecified: Secondary | ICD-10-CM

## 2012-10-18 NOTE — Telephone Encounter (Signed)
Referrals have been made 

## 2012-10-18 NOTE — Telephone Encounter (Signed)
Referral made 

## 2012-10-18 NOTE — Telephone Encounter (Signed)
Message copied by Donata Duff on Fri Oct 18, 2012  8:52 AM ------      Message from: Rachael Fee      Created: Fri Oct 18, 2012  7:30 AM       Verlee Monte, will do            Florella Mcneese, she needs referral to medical oncology and also to Dr. Almond Lint at [facility] for newly diagnosed, potentially resectable, pancreatic cancer.            Thanks                        ----- Message -----         From: Hart Carwin, MD         Sent: 10/17/2012   6:39 PM           To: Rachael Fee, MD            Jesusita Oka, thanks a lot. Go ahead with the referrals. If you think it is appropriate, she probably wants to see me in the office to talk about everything.      Rene Kocher, can you see if Mrs Sirianni has an OV with me? And if not, please, make one. Thanx      ----- Message -----         From: Rachael Fee, MD         Sent: 10/17/2012   3:16 PM           To: Cira Rue, RN, Hart Carwin, MD            Verlee Monte, Just completed EUS. See below.  She will need referral to medical and surgical oncology.  I am happy to do this if you prefer.  Just let me know.  I'll let you know about final cytology report as well.            Almira Coaster,      Can you put her on for next GI tumor board meeting.  Newly diagnosed pancreatic cancer.                        Impression:      1.8cm by 1.6cm solid mass in head of pancreas without any direct involvement with portal vein, SMV, SMA.  Preliminary cytology review from this mass was positive for malignancy (likely adenocarcinoma, however awaiting final review).  Small cyst in body of pancreas was also sampled and this is unlikely a malignant cyst. Her recent acute portal vein thrombosis may be due to malignancy related hypercoagulable state since the head of pancreas mass does not appear to involve the portal vein directly.             ------

## 2012-10-18 NOTE — Telephone Encounter (Signed)
Spoke with patient by phone and confirmed appointment with Dr. Truett Perna for 10/31/12.  Contact names and phone numbers were provided.

## 2012-10-18 NOTE — Telephone Encounter (Signed)
Message copied by Donata Duff on Fri Oct 18, 2012  8:45 AM ------      Message from: Rachael Fee      Created: Fri Oct 18, 2012  7:30 AM       Verlee Monte, will do            Archita Lomeli, she needs referral to medical oncology and also to Dr. Almond Lint at [facility] for newly diagnosed, potentially resectable, pancreatic cancer.            Thanks                        ----- Message -----         From: Hart Carwin, MD         Sent: 10/17/2012   6:39 PM           To: Rachael Fee, MD            Jesusita Oka, thanks a lot. Go ahead with the referrals. If you think it is appropriate, she probably wants to see me in the office to talk about everything.      Rene Kocher, can you see if Mrs Martelle has an OV with me? And if not, please, make one. Thanx      ----- Message -----         From: Rachael Fee, MD         Sent: 10/17/2012   3:16 PM           To: Cira Rue, RN, Hart Carwin, MD            Verlee Monte, Just completed EUS. See below.  She will need referral to medical and surgical oncology.  I am happy to do this if you prefer.  Just let me know.  I'll let you know about final cytology report as well.            Almira Coaster,      Can you put her on for next GI tumor board meeting.  Newly diagnosed pancreatic cancer.                        Impression:      1.8cm by 1.6cm solid mass in head of pancreas without any direct involvement with portal vein, SMV, SMA.  Preliminary cytology review from this mass was positive for malignancy (likely adenocarcinoma, however awaiting final review).  Small cyst in body of pancreas was also sampled and this is unlikely a malignant cyst. Her recent acute portal vein thrombosis may be due to malignancy related hypercoagulable state since the head of pancreas mass does not appear to involve the portal vein directly.             ------

## 2012-10-22 ENCOUNTER — Ambulatory Visit (INDEPENDENT_AMBULATORY_CARE_PROVIDER_SITE_OTHER): Payer: 59 | Admitting: Family Medicine

## 2012-10-22 ENCOUNTER — Encounter (INDEPENDENT_AMBULATORY_CARE_PROVIDER_SITE_OTHER): Payer: Self-pay | Admitting: General Surgery

## 2012-10-22 ENCOUNTER — Other Ambulatory Visit: Payer: Self-pay | Admitting: Family Medicine

## 2012-10-22 ENCOUNTER — Ambulatory Visit (INDEPENDENT_AMBULATORY_CARE_PROVIDER_SITE_OTHER): Payer: Commercial Managed Care - PPO | Admitting: General Surgery

## 2012-10-22 ENCOUNTER — Telehealth: Payer: Self-pay

## 2012-10-22 ENCOUNTER — Telehealth (INDEPENDENT_AMBULATORY_CARE_PROVIDER_SITE_OTHER): Payer: Self-pay

## 2012-10-22 ENCOUNTER — Other Ambulatory Visit (INDEPENDENT_AMBULATORY_CARE_PROVIDER_SITE_OTHER): Payer: Self-pay | Admitting: General Surgery

## 2012-10-22 VITALS — BP 132/84 | HR 84 | Temp 97.6°F | Resp 16 | Ht 66.5 in | Wt 222.6 lb

## 2012-10-22 DIAGNOSIS — C259 Malignant neoplasm of pancreas, unspecified: Secondary | ICD-10-CM

## 2012-10-22 DIAGNOSIS — Z7901 Long term (current) use of anticoagulants: Secondary | ICD-10-CM

## 2012-10-22 LAB — POCT INR: INR: 1.3

## 2012-10-22 MED ORDER — WARFARIN SODIUM 5 MG PO TABS: 10.0000 mg | ORAL_TABLET | Freq: Every day | ORAL | Status: DC

## 2012-10-22 MED ORDER — ENOXAPARIN SODIUM 120 MG/0.8ML ~~LOC~~ SOLN
110.0000 mg | Freq: Two times a day (BID) | SUBCUTANEOUS | Status: DC
Start: 2012-10-22 — End: 2012-10-30

## 2012-10-22 NOTE — Telephone Encounter (Signed)
Notified Blair at [place] pt to remain on Lovenox until she sees Dr. Truett Perna on 10/31/12.

## 2012-10-22 NOTE — Telephone Encounter (Signed)
Message copied by Donata Duff on Tue Oct 22, 2012 11:06 AM ------      Message from: Zacarias Pontes      Created: Tue Oct 22, 2012 10:09 AM       Pt has apt on 9/2 at 2.15....Liborio Nixon      ----- Message -----         From: Donata Duff, CMA         Sent: 10/18/2012   8:52 AM           To: Zacarias Pontes            Dr. Almond Lint at [facility] for newly diagnosed, potentially resectable, pancreatic cancer       ------

## 2012-10-22 NOTE — Patient Instructions (Signed)
We will make referral for radiation oncology.  We will set up chest CT.    I will arrange for Navigator to call you.    We will find out if you will need port a cath.    Follow up to be determined.

## 2012-10-22 NOTE — Telephone Encounter (Signed)
Pt aware.

## 2012-10-23 ENCOUNTER — Other Ambulatory Visit (INDEPENDENT_AMBULATORY_CARE_PROVIDER_SITE_OTHER): Payer: Self-pay | Admitting: General Surgery

## 2012-10-23 ENCOUNTER — Telehealth: Payer: Self-pay | Admitting: *Deleted

## 2012-10-23 DIAGNOSIS — C259 Malignant neoplasm of pancreas, unspecified: Secondary | ICD-10-CM

## 2012-10-23 DIAGNOSIS — I81 Portal vein thrombosis: Secondary | ICD-10-CM

## 2012-10-23 NOTE — Telephone Encounter (Signed)
Toviaz received from [employer] patient assistance- 3 bottles of #30 each, lot number [id number], exp 12/15.

## 2012-10-23 NOTE — Telephone Encounter (Signed)
Spoke with patient by phone and updated her on current appointments.  She appreciated the call and was without questions.  She has contact names and phone numbers in case questions arise.  Will continue to follow.

## 2012-10-24 ENCOUNTER — Telehealth: Payer: Self-pay | Admitting: Family Medicine

## 2012-10-24 ENCOUNTER — Ambulatory Visit (INDEPENDENT_AMBULATORY_CARE_PROVIDER_SITE_OTHER): Payer: 59

## 2012-10-24 DIAGNOSIS — Z23 Encounter for immunization: Secondary | ICD-10-CM

## 2012-10-24 MED ORDER — ONDANSETRON HCL 4 MG PO TABS: 4.0000 mg | ORAL_TABLET | Freq: Three times a day (TID) | ORAL | Status: DC | PRN

## 2012-10-24 NOTE — Telephone Encounter (Signed)
Left message on voice mail advising patient.  Also left message advising patient her Gala Murdoch is here for her to pick up, received from [employer] patient assistance.

## 2012-10-24 NOTE — Telephone Encounter (Signed)
Pt would like RX for zofran.  She says she has been having nausea off and on and is anticipating chemo and radiation and would like to have some on hand.

## 2012-10-24 NOTE — Telephone Encounter (Signed)
Yes of course.  Rx sent.

## 2012-10-25 ENCOUNTER — Ambulatory Visit (HOSPITAL_COMMUNITY)
Admission: RE | Admit: 2012-10-25 | Discharge: 2012-10-25 | Disposition: A | Discharge status: Home / Self Care | Payer: 59 | Source: Ambulatory Visit | Attending: General Surgery | Admitting: General Surgery

## 2012-10-25 ENCOUNTER — Encounter (HOSPITAL_COMMUNITY): Payer: Self-pay

## 2012-10-25 DIAGNOSIS — J984 Other disorders of lung: Secondary | ICD-10-CM | POA: Insufficient documentation

## 2012-10-25 DIAGNOSIS — C259 Malignant neoplasm of pancreas, unspecified: Secondary | ICD-10-CM | POA: Insufficient documentation

## 2012-10-25 DIAGNOSIS — M810 Age-related osteoporosis without current pathological fracture: Secondary | ICD-10-CM | POA: Insufficient documentation

## 2012-10-25 DIAGNOSIS — I700 Atherosclerosis of aorta: Secondary | ICD-10-CM | POA: Insufficient documentation

## 2012-10-25 DIAGNOSIS — R161 Splenomegaly, not elsewhere classified: Secondary | ICD-10-CM | POA: Insufficient documentation

## 2012-10-25 DIAGNOSIS — K7460 Unspecified cirrhosis of liver: Secondary | ICD-10-CM | POA: Insufficient documentation

## 2012-10-25 MED ORDER — IOHEXOL 300 MG/ML  SOLN
80.0000 mL | Freq: Once | INTRAMUSCULAR | Status: AC | PRN
  Administered 2012-10-25: 80 mL via INTRAVENOUS

## 2012-10-27 ENCOUNTER — Emergency Department: Payer: Self-pay | Admitting: Emergency Medicine

## 2012-10-28 ENCOUNTER — Telehealth: Payer: Self-pay | Admitting: Radiology

## 2012-10-28 NOTE — Telephone Encounter (Signed)
Left message on home #--appointment reminder  Amado Nash, RN 10/28/2012 2:22 PM

## 2012-10-29 ENCOUNTER — Ambulatory Visit (HOSPITAL_COMMUNITY)
Admission: RE | Admit: 2012-10-29 | Discharge: 2012-10-29 | Disposition: A | Discharge status: Home / Self Care | Payer: 59 | Source: Ambulatory Visit | Attending: Interventional Radiology | Admitting: Interventional Radiology

## 2012-10-29 ENCOUNTER — Other Ambulatory Visit (HOSPITAL_COMMUNITY): Payer: Self-pay | Admitting: Interventional Radiology

## 2012-10-29 ENCOUNTER — Ambulatory Visit
Admission: RE | Admit: 2012-10-29 | Discharge: 2012-10-29 | Disposition: A | Discharge status: Home / Self Care | Payer: 59 | Source: Ambulatory Visit | Attending: General Surgery | Admitting: General Surgery

## 2012-10-29 ENCOUNTER — Telehealth: Payer: Self-pay | Admitting: Radiology

## 2012-10-29 DIAGNOSIS — C259 Malignant neoplasm of pancreas, unspecified: Secondary | ICD-10-CM

## 2012-10-29 DIAGNOSIS — N281 Cyst of kidney, acquired: Secondary | ICD-10-CM | POA: Insufficient documentation

## 2012-10-29 DIAGNOSIS — I81 Portal vein thrombosis: Secondary | ICD-10-CM | POA: Insufficient documentation

## 2012-10-29 DIAGNOSIS — N2889 Other specified disorders of kidney and ureter: Secondary | ICD-10-CM | POA: Insufficient documentation

## 2012-10-29 DIAGNOSIS — M51379 Other intervertebral disc degeneration, lumbosacral region without mention of lumbar back pain or lower extremity pain: Secondary | ICD-10-CM | POA: Insufficient documentation

## 2012-10-29 DIAGNOSIS — M5137 Other intervertebral disc degeneration, lumbosacral region: Secondary | ICD-10-CM | POA: Insufficient documentation

## 2012-10-29 DIAGNOSIS — K869 Disease of pancreas, unspecified: Secondary | ICD-10-CM | POA: Insufficient documentation

## 2012-10-29 DIAGNOSIS — K8020 Calculus of gallbladder without cholecystitis without obstruction: Secondary | ICD-10-CM | POA: Insufficient documentation

## 2012-10-29 DIAGNOSIS — R911 Solitary pulmonary nodule: Secondary | ICD-10-CM | POA: Insufficient documentation

## 2012-10-29 MED ORDER — IOHEXOL 350 MG/ML SOLN
100.0000 mL | Freq: Once | INTRAVENOUS | Status: AC | PRN
  Administered 2012-10-29: 100 mL via INTRAVENOUS

## 2012-10-29 NOTE — Assessment & Plan Note (Addendum)
Patient does appear to have small mass that is localized to the pancreas. However, she has portal vein thrombosis. This is a contraindication to pancreaticoduodenectomy. I will present her at tumor board to see if there are other options to pursue. She is going to meet with Dr. Truett Perna of oncology to discuss chemotherapy and or chemoradiation.  We will likely repeat her imaging studies. There is a small possibility that she will have recanalized her portal vein. Unfortunately, she will likely have collateralization at that point. Collateralization can lead to life-threatening blood loss.  I discussed the patient with Dr. Truett Perna. He advised that she stop her Coumadin and stay on Lovenox. He is not sure if he would offer her 5-FU with radiation or gemcitabine and Abraxane.  He will advise me if he needs a Port-A-Cath placed for the patient's treatment.  I will determine followup for the patient with myself after tumor Board.  45 minutes were spent in counseling and examination with the patient and family, with greater than 50% of the time being spent in counseling.  Patient was advised that she could continue to work as desired and tolerated unless instructed otherwise after further studies.   Chest CT ordered to rule out metastatic disease.

## 2012-10-29 NOTE — Progress Notes (Addendum)
Patient states she has been off Coumadin since October 22, 2012.  She has been placed on Lovenox injections Q12 hours.  Patient presents today with her husband and daughter.  jkl

## 2012-10-29 NOTE — Telephone Encounter (Signed)
Patient called requesting results of CTA performed earlier today.   Shared the following info with patient per Dr Chester Holstein instructions:  No change in CT Dr Miles Costain spoke with Dr Donell Beers.  She plans to consult with some of her colleagues at [facility] & will discuss options again with Dr Miles Costain. After that point, patient will be contacted.     Harlene Petralia Carmell Austria, RN 10/29/2012 5:10 PM

## 2012-10-29 NOTE — Progress Notes (Signed)
Chief complaint:  Pancreatic cancer  HISTORY: Patient is a 72 year old female who presented to the hospital with severe abdominal pain around 3 weeks ago. 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 She was placed on anti-coagulation and was eventually discharged in stable condition. Her further workup demonstrated 2 masses in the head of the pancreas. She is subsequently undergone endoscopic ultrasound and cytology is positive for pancreatic cancer. The mass does appear to be remote from the portal vein and SMV, so this is not felt to be tumor thrombus. Upon further review of her symptoms. She had been having some discomfort in her abdomen for around a month or 2. This was not frank pain.  She has not had any diarrhea. She also denies issues with her blood sugars that she is aware of. She has not had weight loss. She has no family history of pancreatic cancer. Her mother did have postmenopausal breast cancer and cervical cancer. She has any bleeding issues since being on the anticoagulation. Since being diagnosed with portal vein thrombosis, she has had some nausea.  Past Medical History  Diagnosis Date  . Trigger finger   . Hyperlipidemia   . Diabetes mellitus   . Chronic venous insufficiency   . Morbid obesity   . Hypertension   . Thyroid disease   . GERD (gastroesophageal reflux disease)   . Hypothyroidism   . Arthritis   . Anemia   . Common bile duct dilatation 10/01/12  . Fatty liver 10/01/12  . Portal vein thrombosis   . Cholelithiasis   . Pancreatic lesion 10/01/12  . Hydroureteronephrosis     right  . Cancer   . Pancreatic mass 2014    Past Surgical History  Procedure Laterality Date  . Lumbar laminectomy    . Breast biopsy    . Eus N/A 10/17/2012    Procedure: UPPER ENDOSCOPIC ULTRASOUND (EUS) LINEAR;  Surgeon: Rachael Fee, MD;  Location: [facility];  Service: Endoscopy;  Laterality: N/A;    Current Outpatient Prescriptions  Medication Sig Dispense  Refill  . acetaminophen (TYLENOL) 325 MG tablet Take 325 mg by mouth every 4 (four) hours as needed for pain. Per bottle      . Biotin 5000 MCG CAPS Take 5,000 mcg by mouth daily.       Marland Kitchen doxazosin (CARDURA) 8 MG tablet Take 8 mg by mouth every morning.      . escitalopram (LEXAPRO) 20 MG tablet Take 20 mg by mouth every morning.      . famotidine (PEPCID) 20 MG tablet Take 20 mg by mouth daily as needed for heartburn.       . fesoterodine (TOVIAZ) 4 MG TB24 tablet Take 4 mg by mouth at bedtime.      . furosemide (LASIX) 40 MG tablet Take 40 mg by mouth daily.      . Insulin Glargine (LANTUS SOLOSTAR) 100 UNIT/ML SOPN Inject 70 Units into the skin every morning.      Marland Kitchen levothyroxine (SYNTHROID, LEVOTHROID) 88 MCG tablet Take 88 mcg by mouth daily before breakfast.      . Liraglutide (VICTOZA) 18 MG/3ML SOPN Inject 1.8 mg into the skin daily.      Marland Kitchen loratadine (CLARITIN) 10 MG tablet Take 10 mg by mouth daily as needed for allergies.       Marland Kitchen LORazepam (ATIVAN) 0.5 MG tablet .5 to 1 tab daily qhs prn insomnia.  30 tablet  0  . losartan (COZAAR) 25 MG  tablet Take 25 mg by mouth every morning.      . metFORMIN (GLUCOPHAGE) 1000 MG tablet Take 1,000 mg by mouth 2 (two) times daily with a meal.      . pantoprazole (PROTONIX) 40 MG tablet Take 40 mg by mouth daily.      . potassium chloride (KLOR-CON) 20 MEQ packet Take 20 mEq by mouth daily.      . pregabalin (LYRICA) 75 MG capsule 1 cap every morning and 1 at bedtime      . pregabalin (LYRICA) 75 MG capsule Take 75-150 mg by mouth 2 (two) times daily. Take one capsule in the am and two capsules at bedtime      . simvastatin (ZOCOR) 20 MG tablet Take 20 mg by mouth at bedtime.      . traMADol (ULTRAM) 50 MG tablet Take 50 mg by mouth every 6 (six) hours as needed for pain.      Marland Kitchen enoxaparin (LOVENOX) 120 MG/0.8ML injection Inject 0.73 mLs (110 mg total) into the skin every 12 (twelve) hours.  14 Syringe  0  . ondansetron (ZOFRAN) 4 MG tablet Take 1  tablet (4 mg total) by mouth every 8 (eight) hours as needed for nausea.  20 tablet  0  . warfarin (COUMADIN) 5 MG tablet Take 2 tablets (10 mg total) by mouth daily.  65 tablet  2  . [DISCONTINUED] fesoterodine (TOVIAZ) 4 MG TB24 Take 1 tablet (4 mg total) by mouth at bedtime.  30 tablet  3   No current facility-administered medications for this visit.     Allergies  Allergen Reactions  . Latex Rash  . Oxycodone-Acetaminophen Nausea Only  . Zithromax [Azithromycin] Other (See Comments)    thrush     Family History  Problem Relation Age of Onset  . Heart disease Father   . Arthritis Father   . Breast cancer Mother   . Lymphoma Sister     #1  . Diabetes Sister     #1  . Diabetes Sister     #2  . Diabetes Brother     #1  . Diabetes Brother     #2  . Diabetes Other     siblings  . Hypertension Other     siblings  . Arthritis Other     siblings     History   Social History  . Marital Status: Married    Spouse Name: N/A    Number of Children: N/A  . Years of Education: N/A   Social History Main Topics  . Smoking status: Never Smoker   . Smokeless tobacco: Never Used  . Alcohol Use: No  . Drug Use: No  . Sexual Activity: None   Other Topics Concern  . None   Social History Narrative   Exercises 3-4 times a week   No caffeine   2 children - twins     REVIEW OF SYSTEMS - PERTINENT POSITIVES ONLY: 12 point review of systems negative other than HPI and PMH except for difficulty sleeping, cough, nausea, reflux, urinary incontinence, sinus issues, joint pain, blurry vision.    EXAM: Filed Vitals:   10/22/12 1433  BP: 132/84  Pulse: 84  Temp: 97.6 F (36.4 C)  Resp: 16   Filed Weights   10/22/12 1433  Weight: 222 lb 9.6 oz (100.971 kg)     Gen:  No acute distress.  Well nourished and well groomed.   Neurological: Alert and oriented to person, place, and time. Coordination  normal.  Head: Normocephalic and atraumatic.  Eyes: Conjunctivae are  normal. Pupils are equal, round, and reactive to light. No scleral icterus.  Neck: Normal range of motion. Neck supple. No tracheal deviation or thyromegaly present.  Cardiovascular: Normal rate, regular rhythm, normal heart sounds and intact distal pulses.  Exam reveals no gallop and no friction rub.  No murmur heard. Respiratory: Effort normal.  No respiratory distress. No chest wall tenderness. Breath sounds normal.  No wheezes, rales or rhonchi.  GI: Soft. Bowel sounds are normal. The abdomen is soft and nontender.  There is no rebound and no guarding.  Musculoskeletal: Normal range of motion. Extremities are nontender.  Lymphadenopathy: No cervical, preauricular, postauricular or axillary adenopathy is present Skin: Skin is warm and dry. No rash noted. No diaphoresis. No erythema. No pallor. No clubbing, cyanosis, or edema.   Psychiatric: Normal mood and affect. Behavior is normal. Judgment and thought content normal.    LABORATORY RESULTS: Available labs are reviewed  Labs reviewed in epic.  CA 19-9 527. Albumin 3.3. Pathology Diagnosis FINE NEEDLE ASPIRATION, ENDOSCOPIC, PANCREAS HEAD: ABUNDANT NECROSIS AND MALIGNANT GLANDULAR CELLS, CONSISTENT WITH ADENOCARCINOMA.  RADIOLOGY RESULTS: See E-Chart or I-Site for most recent results.  Images and reports are reviewed. CT angio  IMPRESSION:  Stable portal vein thrombosis as described. There are associated  inflammatory changes.  Stable 1.2 cm hypodensity in the body of the pancreas. 28-month  follow-up is recommended as cystic neoplasm is not entirely  excluded.  Small pleural effusions have enlarged. Small amount of free fluid  is now more noticeable about the liver.  Stable cholelithiasis.  Stable right hydronephrosis and slightly delayed contrast  excretion. These are secondary findings of partial right ureteral  obstruction.  No significant obstruction in the arterial vasculature.  There is a second 1.5 cm lesion in the head  of the pancreas that is  indeterminate. MRI the pancreas is recommended to further  characterize. Alternatively, trans duodenal ultrasound biopsy is  also a consideration.    ASSESSMENT AND PLAN: Pancreatic cancer Patient does appear to have small mass that is localized to the pancreas. However, she has portal vein thrombosis. This is a contraindication to pancreaticoduodenectomy. I will present her at tumor board to see if there are other options to pursue. She is going to meet with Dr. Truett Perna of oncology to discuss chemotherapy and or chemoradiation.  We will likely repeat her imaging studies. There is a small possibility that she will have recanalized her portal vein. Unfortunately, she will likely have collateralization at that point. Collateralization can lead to life-threatening blood loss.  I discussed the patient with Dr. Truett Perna. He advised that she stop her Coumadin and stay on Lovenox. He is not sure if he would offer her 5-FU with radiation or gemcitabine and Abraxane.  He will advise me if he needs a Port-A-Cath placed for the patient's treatment.  I will determine followup for the patient with myself after tumor Board.  45 minutes were spent in counseling and examination with the patient and family, with greater than 50% of the time being spent in counseling.  Patient was advised that she could continue to work as desired and tolerated unless instructed otherwise after further studies.   Chest CT ordered to rule out metastatic disease.       Maudry Diego MD Surgical Oncology, General and Endocrine Surgery [facility]      Visit Diagnoses: 1. Pancreatic cancer     Primary Care Physician: Jovita Gamma  Dayton Martes, MD

## 2012-10-30 ENCOUNTER — Encounter: Payer: Self-pay | Admitting: Radiation Oncology

## 2012-10-30 ENCOUNTER — Other Ambulatory Visit: Payer: Self-pay | Admitting: *Deleted

## 2012-10-30 ENCOUNTER — Telehealth: Payer: Self-pay | Admitting: Emergency Medicine

## 2012-10-30 NOTE — Progress Notes (Addendum)
GI Location of Tumor / Histology: Pancreas   Patient presented  With sever abdominal pain  3 weeks ago, had acute portal vein thrombosis   Biopsies of  (if applicable) revealed:  10/17/12: FINE NEEDLE ASPIRATION, ENDOSCOPIC, PANCREAS HEAD:ABUNDANT NECROSIS AND MALIGNANT GLANDULAR CELLS, CONSISTENT WITH ADENOCARCINOMA. Past/Anticipated interventions by surgeon, if any: Upper Endoscopic ultrasound linear 10/17/12  Dr.Jacobs and cytology  Aspiration Appr with Dr.Dora Juanda Chance 11/12/12 Past/Anticipated interventions by medical oncology, if any: appt with Dr.SHERRILL  10/31/12,   Weight changes, if any:no Bowel/Bladder complaints, if WUJ:[id number] voiding, nocturia 2-3x on Toviax, bladder not emptying completely, may need a stent  Nausea / Vomiting, if any:yes Pain issues, if any: abdomen SAFETY ISSUES:  Prior radiation?no  Pacemaker/ICD? no  Possible current pregnancy?no  Is the patient on methotrexate? no  Current Complaints / other details:   Married, 2 children (twins)  8/12/14hx breast biopsy , mother breast cancer,father heart disease,, 1 sister lymphoma. 2 sisters and 2brothers diabetes, ,never smoker,no alcohol or ilicit  drug use Fell Sunday  At grocery store on lovenox, went to [facility] hospital  Ct head neg. Bruise on right cheek of face Menses age 64,, pregnancy age 22, menopause age 71, had HRT 2 years

## 2012-10-30 NOTE — Telephone Encounter (Signed)
LM FOR JENNIFER AT [facility] TO SET PT UP FOR PORTAL LYSIS AND TO HAVE DR HM OR DDH TO ASSIST.    CALLED KELLY MCKINNEY TO SEE IF WE CAN GET NEXT 11-07-12 W/ DRS SHICK/MCCULLOUGH

## 2012-10-31 ENCOUNTER — Ambulatory Visit (HOSPITAL_BASED_OUTPATIENT_CLINIC_OR_DEPARTMENT_OTHER): Payer: 59 | Admitting: Oncology

## 2012-10-31 ENCOUNTER — Other Ambulatory Visit (HOSPITAL_COMMUNITY): Payer: Self-pay | Admitting: Interventional Radiology

## 2012-10-31 ENCOUNTER — Encounter: Payer: Self-pay | Admitting: Radiation Oncology

## 2012-10-31 ENCOUNTER — Ambulatory Visit: Payer: 59

## 2012-10-31 ENCOUNTER — Other Ambulatory Visit: Payer: Self-pay | Admitting: *Deleted

## 2012-10-31 ENCOUNTER — Ambulatory Visit
Admission: RE | Admit: 2012-10-31 | Discharge: 2012-10-31 | Disposition: A | Discharge status: Home / Self Care | Payer: 59 | Source: Ambulatory Visit | Attending: Radiation Oncology | Admitting: Radiation Oncology

## 2012-10-31 ENCOUNTER — Encounter: Payer: Self-pay | Admitting: Oncology

## 2012-10-31 ENCOUNTER — Telehealth: Payer: Self-pay | Admitting: Oncology

## 2012-10-31 ENCOUNTER — Ambulatory Visit: Admission: RE | Admit: 2012-10-31 | Payer: 59 | Source: Ambulatory Visit | Admitting: Radiation Oncology

## 2012-10-31 VITALS — BP 142/75 | HR 75 | Temp 97.2°F | Resp 20 | Ht 66.5 in | Wt 230.0 lb

## 2012-10-31 VITALS — BP 97/61 | HR 80 | Temp 98.2°F | Resp 20 | Ht 66.5 in | Wt 228.7 lb

## 2012-10-31 DIAGNOSIS — E039 Hypothyroidism, unspecified: Secondary | ICD-10-CM | POA: Insufficient documentation

## 2012-10-31 DIAGNOSIS — E785 Hyperlipidemia, unspecified: Secondary | ICD-10-CM | POA: Insufficient documentation

## 2012-10-31 DIAGNOSIS — I81 Portal vein thrombosis: Secondary | ICD-10-CM | POA: Insufficient documentation

## 2012-10-31 DIAGNOSIS — I10 Essential (primary) hypertension: Secondary | ICD-10-CM | POA: Insufficient documentation

## 2012-10-31 DIAGNOSIS — C259 Malignant neoplasm of pancreas, unspecified: Secondary | ICD-10-CM

## 2012-10-31 DIAGNOSIS — C250 Malignant neoplasm of head of pancreas: Secondary | ICD-10-CM | POA: Insufficient documentation

## 2012-10-31 DIAGNOSIS — K7689 Other specified diseases of liver: Secondary | ICD-10-CM | POA: Insufficient documentation

## 2012-10-31 DIAGNOSIS — N1330 Unspecified hydronephrosis: Secondary | ICD-10-CM

## 2012-10-31 DIAGNOSIS — Z79899 Other long term (current) drug therapy: Secondary | ICD-10-CM | POA: Insufficient documentation

## 2012-10-31 DIAGNOSIS — E119 Type 2 diabetes mellitus without complications: Secondary | ICD-10-CM

## 2012-10-31 HISTORY — DX: Allergy, unspecified, initial encounter: T78.40XA

## 2012-10-31 HISTORY — DX: Anxiety disorder, unspecified: F41.9

## 2012-10-31 MED ORDER — ENOXAPARIN SODIUM 120 MG/0.8ML ~~LOC~~ SOLN: 110.0000 mg | Freq: Two times a day (BID) | SUBCUTANEOUS | Status: DC

## 2012-10-31 NOTE — Progress Notes (Signed)
Checked in new pt with no financial concerns. °

## 2012-10-31 NOTE — Telephone Encounter (Signed)
gv pt appt schedule for September.  °

## 2012-10-31 NOTE — Progress Notes (Signed)
Met with Brandt Loosen and family. Explained role of nurse navigator. Educational information provided on pancreatic cancer. Referral made to dietician for diet education. [facility] resources provided to patient, including SW service information. She declined need for assistance at this time.  No barriers to care identified. Contact names and phone numbers were provided.  Also gave patient's FMLA paperwork to [place], in revenue cycle department,  for completion.  Will continue to follow as needed.

## 2012-10-31 NOTE — Progress Notes (Signed)
[facility] New Patient Consult   Referring MD: Deania Siguenza 72 y.o.  [birth year]    Reason for Referral: Pancreas cancer     HPI: She reports upper abdominal pain for the past several months. She also noted postprandial fullness. She developed nausea and vomiting on one occasion and presented to the emergency room on 10/01/2012. A CT of the abdomen revealed a portal vein thrombosis with extension to the superior mesenteric vein and surrounding perivenous stranding. Moderate right Hydro ureterohydronephrosis was noted. No intrinsic or extrinsic mass was identified. No stone. An ill-defined hypodense lesion was noted in head of the pancreas. A second 1.5 cm lesion was noted in the head of the pancreas. A 5 mm hypodense lesion in the body of the pancreas. No pancreatic dilatation. The adrenal glands appeared normal. No lymphadenopathy.  Gastroenterology was consulted and she was taken on endoscopic ultrasound on 10/17/2012 81.8 cm mass was noted in the head of the pancreas that did not involve the portal vein or superior mesenteric vein. The mass was biopsied. An 8 mm cystic lesion was noted in the body of the pancreas without internal solid masses. The fluid was aspirated and sent for cytology. The pancreas parenchyma was otherwise normal. The common bile duct was not dilated. No peripancreatic adenopathy. The biopsy of the pancreas head mass revealed necrosis and malignant cells consistent with adenocarcinoma. The cyst aspiration revealed no malignant cells.  Dr. Donell Beers was consulted. She is not a surgical candidate in the setting of portal vein thrombosis. The case was discussed at the GI tumor conference and interventional radiology recommended considering a probable lysis procedure. She saw Dr. Miles Costain on 10/29/2012. Repeat imaging confirmed thrombus involving the portal venous system including the central aspect of the superior rest or veins and main portal vein.  Thrombus extending to the origin the right portal vein with the left portal vein widely patent. Improved venous flow was noted in the distal mesenteric venous structures compared to the previous study. A 4 mm nodule right lower lobe with a small amount of right pleural fluid or pleural thickening. No suspicious liver lesions. Mild edema in the abdominal mesentery. Stable dilatation of the right renal pelvis.  Dr. Miles Costain plans to proceed with portal vein probable lysis next week.  She reports no abdominal pain at present.  Past Medical History  Diagnosis Date  . Trigger finger   . Hyperlipidemia   . Diabetes mellitus   . Chronic venous insufficiency   . Morbid obesity   . Hypertension   .  G1 P2    . GERD (gastroesophageal reflux disease)   . Hypothyroidism   . Arthritis   . Anemia   . Common bile duct dilatation 10/01/12  . Fatty liver 10/01/12  . Portal vein thrombosis  10/01/2012   . Cholelithiasis   . Pancreatic lesion-endoscopic ultrasound biopsy confirmed adenocarcinoma  10/17/12  . Hydroureteronephrosis  A. 12 2014     right  .  history of kidney stones    . Cancer 10/17/12     pancreatic   . Allergy   . Anxiety     Past Surgical History  Procedure Laterality Date  . Lumbar laminectomy    . Breast biopsy-bilateral    1970 s  . Eus N/A 10/17/2012    Procedure: UPPER ENDOSCOPIC ULTRASOUND (EUS) LINEAR;  Surgeon: Rachael Fee, MD;  Location: [facility];  Service: Endoscopy;  Laterality: N/A;    Family History  Problem  Relation Age of Onset  . Heart disease Father   . Arthritis Father   . Breast cancer Mother  postmenopausal   . Lymphoma Sister     #1  . Diabetes Sister     #1  . Diabetes Sister     #2  . Diabetes Brother     #1  . Diabetes Brother     #2  . Diabetes Other     siblings  . Hypertension Other     siblings  . Arthritis Other     siblings   .  Lymphoma                                        Sister  .  Lymphoma                                         Sister  Current outpatient prescriptions:acetaminophen (TYLENOL) 325 MG tablet, Take 325 mg by mouth every 4 (four) hours as needed for pain. Per bottle, Disp: , Rfl: ;  Biotin 5000 MCG CAPS, Take 5,000 mcg by mouth daily. , Disp: , Rfl: ;  doxazosin (CARDURA) 8 MG tablet, Take 8 mg by mouth every morning., Disp: , Rfl:  enoxaparin (LOVENOX) 120 MG/0.8ML injection, Inject 0.73 mLs (110 mg total) into the skin every 12 (twelve) hours., Disp: 14 Syringe, Rfl: 0;  escitalopram (LEXAPRO) 20 MG tablet, Take 20 mg by mouth every morning., Disp: , Rfl: ;  famotidine (PEPCID) 20 MG tablet, Take 20 mg by mouth daily as needed for heartburn. , Disp: , Rfl: ;  fesoterodine (TOVIAZ) 4 MG TB24 tablet, Take 4 mg by mouth at bedtime., Disp: , Rfl:  furosemide (LASIX) 40 MG tablet, Take 40 mg by mouth daily., Disp: , Rfl: ;  Insulin Glargine (LANTUS SOLOSTAR) 100 UNIT/ML SOPN, Inject 70 Units into the skin every morning., Disp: , Rfl: ;  levothyroxine (SYNTHROID, LEVOTHROID) 88 MCG tablet, Take 88 mcg by mouth daily before breakfast., Disp: , Rfl: ;  Liraglutide (VICTOZA) 18 MG/3ML SOPN, Inject 1.8 mg into the skin daily., Disp: , Rfl:  loratadine (CLARITIN) 10 MG tablet, Take 10 mg by mouth daily as needed for allergies. , Disp: , Rfl: ;  losartan (COZAAR) 25 MG tablet, Take 25 mg by mouth every morning., Disp: , Rfl: ;  metFORMIN (GLUCOPHAGE) 1000 MG tablet, Take 1,000 mg by mouth 2 (two) times daily with a meal., Disp: , Rfl: ;  ondansetron (ZOFRAN) 4 MG tablet, Take 1 tablet (4 mg total) by mouth every 8 (eight) hours as needed for nausea., Disp: 20 tablet, Rfl: 0 pantoprazole (PROTONIX) 40 MG tablet, Take 40 mg by mouth daily., Disp: , Rfl: ;  potassium chloride (KLOR-CON) 20 MEQ packet, Take 20 mEq by mouth daily., Disp: , Rfl: ;  pregabalin (LYRICA) 75 MG capsule, 1 cap every morning and 1 at bedtime, Disp: , Rfl: ;  pregabalin (LYRICA) 75 MG capsule, Take 75-150 mg by mouth 2 (two) times daily. Take one  capsule in the am and two capsules at bedtime, Disp: , Rfl:  simvastatin (ZOCOR) 20 MG tablet, Take 20 mg by mouth at bedtime., Disp: , Rfl: ;  traMADol (ULTRAM) 50 MG tablet, Take 50 mg by mouth every 6 (six) hours as needed for pain., Disp: , Rfl: ;  traZODone (DESYREL) 50 MG tablet, Take 50 mg by mouth at bedtime., Disp: , Rfl: ;  [DISCONTINUED] fesoterodine (TOVIAZ) 4 MG TB24, Take 1 tablet (4 mg total) by mouth at bedtime., Disp: 30 tablet, Rfl: 3  Allergies:  Allergies  Allergen Reactions  . Latex Rash  . Oxycodone-Acetaminophen Nausea Only  . Zithromax [Azithromycin] Other (See Comments)    thrush    Social History: She lives in [place]. She works for the [employer], she does not use tobacco or alcohol. No transfusion history. No risk factor for HIV or hepatitis.  ROS:   Positives include: Difficulty swallowing pills, nausea and vomiting for one episode on the day of hospital admission in August 2014, chronic urinary frequency, upper abdominal pain-improved  A complete ROS was otherwise negative.  Physical Exam:  Blood pressure 142/75, pulse 75, temperature 97.2 F (36.2 C), temperature source Oral, resp. rate 20, height 5' 6.5" (1.689 m), weight 230 lb (104.327 kg).  HEENT: Oropharynx without visible mass, neck without mass Lungs: Clear bilaterally Cardiac: Regular rate and rhythm Abdomen: No hepatomegaly, no mass, nontender, no apparent ascites  Vascular: No leg edema Lymph nodes: No cervical, supraclavicular, axillary, or inguinal nodes Neurologic: Alert and oriented, the motor exam appears intact in the upper and lower extremities Skin: No rash, ecchymoses with induration over the abdominal wall Musculoskeletal: No spine tenderness   LAB:  CBC  Lab Results  Component Value Date   WBC 6.0 10/06/2012   HGB 9.4* 10/06/2012   HCT 28.7* 10/06/2012   MCV 83.2 10/06/2012   PLT 174 10/06/2012   hemoglobin 10.7, MCV 84.9 on 10/01/2012 Reticulocyte count  0.8% on 10/02/2012  Ferritin 65, percent transferrin saturation 6 on 10/02/2012  CA 19-9 527.5 on 10/02/2012  CMP      Component Value Date/Time   NA 141 10/06/2012 0142   K 3.2* 10/06/2012 0142   CL 106 10/06/2012 0142   CO2 26 10/06/2012 0142   GLUCOSE 146* 10/06/2012 0142   BUN 6 10/06/2012 0142   CREATININE 0.54 10/06/2012 0142   CALCIUM 8.7 10/06/2012 0142   PROT 6.2 10/06/2012 0142   ALBUMIN 2.9* 10/06/2012 0142   AST 16 10/06/2012 0142   ALT 17 10/06/2012 0142   ALKPHOS 42 10/06/2012 0142   BILITOT 0.4 10/06/2012 0142   GFRNONAA >90 10/06/2012 0142   GFRAA >90 10/06/2012 0142     Radiology: As per history of present illness, CT the chest on 10/25/2012-4 mm right lower lobe nodule, 4 mm left upper lobe nodule, "cirrhotic "changes and splenomegaly    Assessment/Plan:   1. Adenocarcinoma pancreas, pancreas head mass, endoscopic ultrasound on 10/17/2012 confirmed a pancreas head mass without vascular invasion or surrounding lymphadenopathy, biopsy positive for adenocarcinoma. -Elevated CA 19-9  2. Portal vein thrombosis-maintained on Lovenox  3. Diabetes  4. Right hydronephrosis-? Related to history of kidney stones versus carcinomatosis  5. "Cirrhotic "changes and splenomegaly reported on the chest CT 10/25/2012  6. History of kidney stones  7. Hypothyroidism  8. Normocytic anemia-? Anemia of chronic disease   Disposition:   Ms. Theresa Summers has been diagnosed with adenocarcinoma the pancreas. I discussed the diagnosis and treatment options with Ms. Volante and her family. She understands the only potentially curative treatment for pancreas cancer is surgical resection. I discussed the case with Dr. Donell Beers. The plan is to proceed with an attempt at, lysis of the portal vein thrombosis. Surgery will be attempted if the portal vein can be opened and there are not  significant venous collaterals.  She is at risk for having more advanced stage disease based on the markedly elevated  serum tumor marker and unexplained hydronephrosis.  Ms. Thul understands the majority of patients to pancreas cancer are not surgical candidates. If she is unable to have surgery we will consider systemic chemotherapy and radiation options. We discussed  Her case will again be presented at the GI tumor conference on 11/13/2012. She will return for an office visit here on 11/14/2012  Approximately 60 minutes were spent with the patient and her family today. The majority of the time was spent in counseling/coordination of care.  Byard Carranza 10/31/2012, 5:29 PM

## 2012-10-31 NOTE — Progress Notes (Signed)
Please see the Nurse Progress Note in the MD Initial Consult Encounter for this patient. 

## 2012-11-01 ENCOUNTER — Other Ambulatory Visit: Payer: Self-pay | Admitting: Radiology

## 2012-11-01 ENCOUNTER — Telehealth: Payer: Self-pay | Admitting: Family Medicine

## 2012-11-01 ENCOUNTER — Encounter: Payer: Self-pay | Admitting: Oncology

## 2012-11-01 DIAGNOSIS — C250 Malignant neoplasm of head of pancreas: Secondary | ICD-10-CM | POA: Insufficient documentation

## 2012-11-01 NOTE — Progress Notes (Signed)
Put [employer] disability form on nurse's desk °

## 2012-11-01 NOTE — Telephone Encounter (Signed)
Pt called and said that she has a lysis of a portal vein thrombosis scheduled for next Thursday with Dr. Denny Levy at [facility] and wants instructions as to whether she should continue Lovenox bridge.  I advised pt that I assumed they would continue her on Lovenox but I wanted to double check. I called [facility] and was directed to [place] who is out of the office until Monday.  I left her a voicemail to return my call.  In the meantime, directed pt to continue Lovenox until further notice.

## 2012-11-01 NOTE — Progress Notes (Signed)
Radiation Oncology         [phone] ________________________________  Name: Theresa Summers MRN: [id number]  Date: 10/31/2012  DOB: [birth year]  WU:[id number] Dayton Martes, MD  Almond Lint, MD   G. Rolm Baptise, M.D.  REFERRING PHYSICIAN: Almond Lint, MD   DIAGNOSIS: The encounter diagnosis was Pancreatic cancer.   HISTORY OF PRESENT ILLNESS::Theresa Summers is a 72 y.o. female who is seen for an initial consultation visit. The patient indicates that she had some upper abdominal pain for several months. This appeared to occur more after meals. She states that this began roughly in July of 2013. The patient proceeded to go to the emergency room and a CT scan of the abdomen revealed a portal vein thrombosis with some surrounding stranding. Moderate right-sided hydroureter ureterohydronephrosis was noted. No masses as defined associated with this. An ill-defined hypodense lesion was present within the head of the pancreas measuring 1.5 cm. An additional 5 mm hypodense lesion was seen in the body of the pancreas.  Endoscopic ultrasound was completed on 10/17/2012. A 1.8 cm mass was noted within the head of the pancreas which did not involve the portal vein or superior mesenteric vein. An additional 8 mm cystic lesion was noted within the body of the pancreas without any internal solid masses. Fluid was aspirated from this cyst. This did not reveal any malignant cells. Biopsy of the pancreatic head mass returned positive for adenocarcinoma.  The patient's case was discussed in multidisciplinary GI conference this morning. Dr. Donell Beers has seen the patient and does not feel that the patient likely will be a candidate for a resection given possible collateral formation do to the thrombus. However, interventional radiology noted that lysis of the thrombosis may be possible which would potentially allow/facilitate resection. She did see interventional radiology who performed an additional scan and they are  discussing things with Dr. Donell Beers to see if this would be appropriate to proceed with this. The current tentative plan is for the patient to proceed with lysis next week.   PREVIOUS RADIATION THERAPY: No   PAST MEDICAL HISTORY:  has a past medical history of Trigger finger; Hyperlipidemia; Diabetes mellitus; Chronic venous insufficiency; Morbid obesity; Hypertension; Thyroid disease; GERD (gastroesophageal reflux disease); Hypothyroidism; Arthritis; Anemia; Common bile duct dilatation (10/01/12); Fatty liver (10/01/12); Portal vein thrombosis; Cholelithiasis; Pancreatic lesion (10/01/12); Hydroureteronephrosis; Pancreatic mass (2014); Cancer (10/17/12 ); Allergy; and Anxiety.     PAST SURGICAL HISTORY: Past Surgical History  Procedure Laterality Date  . Lumbar laminectomy    . Breast biopsy    . Eus N/A 10/17/2012    Procedure: UPPER ENDOSCOPIC ULTRASOUND (EUS) LINEAR;  Surgeon: Rachael Fee, MD;  Location: [facility];  Service: Endoscopy;  Laterality: N/A;     FAMILY HISTORY: family history includes Arthritis in her father and other; Breast cancer in her mother; Diabetes in her brother, brother, other, sister, and sister; Heart disease in her father; Hypertension in her other; Lymphoma in her sister.   SOCIAL HISTORY:  reports that she has never smoked. She has never used smokeless tobacco. She reports that she does not drink alcohol or use illicit drugs.   ALLERGIES: Latex; Oxycodone-acetaminophen; and Zithromax   MEDICATIONS:  Current Outpatient Prescriptions  Medication Sig Dispense Refill  . acetaminophen (TYLENOL) 325 MG tablet Take 325 mg by mouth every 4 (four) hours as needed for pain. Per bottle      . Biotin 5000 MCG CAPS Take 5,000 mcg by mouth daily.       Marland Kitchen  doxazosin (CARDURA) 8 MG tablet Take 8 mg by mouth every morning.      . escitalopram (LEXAPRO) 20 MG tablet Take 20 mg by mouth every morning.      . famotidine (PEPCID) 20 MG tablet Take 20 mg by mouth daily as  needed for heartburn.       . fesoterodine (TOVIAZ) 4 MG TB24 tablet Take 4 mg by mouth at bedtime.      . furosemide (LASIX) 40 MG tablet Take 40 mg by mouth daily.      . Insulin Glargine (LANTUS SOLOSTAR) 100 UNIT/ML SOPN Inject 70 Units into the skin every morning.      Marland Kitchen levothyroxine (SYNTHROID, LEVOTHROID) 88 MCG tablet Take 88 mcg by mouth daily before breakfast.      . Liraglutide (VICTOZA) 18 MG/3ML SOPN Inject 1.8 mg into the skin daily.      Marland Kitchen loratadine (CLARITIN) 10 MG tablet Take 10 mg by mouth daily as needed for allergies.       Marland Kitchen losartan (COZAAR) 25 MG tablet Take 25 mg by mouth every morning.      . metFORMIN (GLUCOPHAGE) 1000 MG tablet Take 1,000 mg by mouth 2 (two) times daily with a meal.      . ondansetron (ZOFRAN) 4 MG tablet Take 1 tablet (4 mg total) by mouth every 8 (eight) hours as needed for nausea.  20 tablet  0  . pantoprazole (PROTONIX) 40 MG tablet Take 40 mg by mouth daily.      . potassium chloride (KLOR-CON) 20 MEQ packet Take 20 mEq by mouth daily.      . pregabalin (LYRICA) 75 MG capsule 1 cap every morning and 1 at bedtime      . pregabalin (LYRICA) 75 MG capsule Take 75-150 mg by mouth 2 (two) times daily. Take one capsule in the am and two capsules at bedtime      . simvastatin (ZOCOR) 20 MG tablet Take 20 mg by mouth at bedtime.      . traMADol (ULTRAM) 50 MG tablet Take 50 mg by mouth every 6 (six) hours as needed for pain.      Marland Kitchen enoxaparin (LOVENOX) 120 MG/0.8ML injection Inject 0.73 mLs (110 mg total) into the skin every 12 (twelve) hours.  14 Syringe  0  . traZODone (DESYREL) 50 MG tablet Take 50 mg by mouth at bedtime.      . [DISCONTINUED] fesoterodine (TOVIAZ) 4 MG TB24 Take 1 tablet (4 mg total) by mouth at bedtime.  30 tablet  3   No current facility-administered medications for this encounter.     REVIEW OF SYSTEMS:  A 15 point review of systems is documented in the electronic medical record. This was obtained by the nursing staff.  However, I reviewed this with the patient to discuss relevant findings and make appropriate changes.  Pertinent items are noted in HPI.    PHYSICAL EXAM:  height is 5' 6.5" (1.689 m) and weight is 228 lb 11.2 oz (103.738 kg). Her oral temperature is 98.2 F (36.8 C). Her blood pressure is 97/61 and her pulse is 80. Her respiration is 20.   General: Well-developed, in no acute distress HEENT: Normocephalic, atraumatic; oral cavity clear Neck: Supple without any lymphadenopathy Cardiovascular: Regular rate and rhythm Respiratory: Clear to auscultation bilaterally GI: Soft, nontender, normal bowel sounds Extremities: No edema present Neuro: No focal deficits     LABORATORY DATA:  Lab Results  Component Value Date   WBC 6.0 10/06/2012  HGB 9.4* 10/06/2012   HCT 28.7* 10/06/2012   MCV 83.2 10/06/2012   PLT 174 10/06/2012   Lab Results  Component Value Date   NA 141 10/06/2012   K 3.2* 10/06/2012   CL 106 10/06/2012   CO2 26 10/06/2012   Lab Results  Component Value Date   ALT 17 10/06/2012   AST 16 10/06/2012   ALKPHOS 42 10/06/2012   BILITOT 0.4 10/06/2012      RADIOGRAPHY: Ct Chest W Contrast  10/25/2012   *RADIOLOGY REPORT*  Clinical Data: Pancreatic cancer.Evaluate for metastatic disease.  CT CHEST WITH CONTRAST  Technique:  Multidetector CT imaging of the chest was performed following the standard protocol during bolus administration of intravenous contrast.  Contrast: 80mL OMNIPAQUE IOHEXOL 300 MG/ML  SOLN  Comparison: None  Findings: The chest wall is unremarkable.  No breast masses, supraclavicular or axillary adenopathy.  The thyroid gland appears normal.  The bony thorax is intact.  No destructive bone lesions or spinal canal compromise.  Diffuse and fairly marked osteoporosis.  The heart is normal in size.  No pericardial effusion.  The aorta demonstrates moderate atherosclerotic calcifications but no aneurysm or dissection. There are scattered mediastinal and hilar lymph nodes  which are likely benign.  Examination of the lung parenchyma demonstrate no significant acute pulmonary findings.  No pulmonary mass.  A 4 mm nodule is noted in the right lower lobe on image number 26.  There is also a 4 mm nodule in the left upper lobe on image number 22.  These can be followed.  The upper abdomen demonstrates cirrhotic changes and splenomegaly.  IMPRESSION:  1.  Two small pulmonary nodules which are indeterminate.  A follow- up noncontrast chest CT in 4-6 months is suggested. 2.  No pulmonary mass lesions or adenopathy.   Original Report Authenticated By: Rudie Meyer, M.D.   Ct Angio Abdomen W/cm &/or Wo Contrast  10/29/2012   *RADIOLOGY REPORT*  Clinical Data:  Portal vein thrombosis.  New diagnosis of pancreatic cancer.  Evaluate for possible portal vein thrombolysis.  CT ANGIOGRAPHY ABDOMEN  Technique:  Multidetector CT imaging of the abdomen was performed using the standard protocol during bolus administration of intravenous contrast.  Multiplanar reconstructed images including MIPs were obtained and reviewed to evaluate the vascular anatomy.  Contrast: OMNIPAQUE IOHEXOL 350 MG/ML SOLN  Comparison:  CT 10/03/2012  Findings:  Vascular findings:  The abdominal aorta is patent without aneurysm.  Celiac trunk, SMA, bilateral renal arteries and IMA are patent.  Incidentally, the right hepatic artery originates from the celiac trunk.  Again noted is thrombus involving the portal venous system. Thrombus involves the central aspect of the superior mesenteric veins and extends to the portal confluence and main portal vein. Thrombus extends to the origin of the right portal vein.  Left portal vein is widely patent. Main branches of the right portal vein remain patent.  There is some improved venous flow in the distal mesenteric venous structures compared to the prior examination.  A few prominent collateral venous structures in the porta hepatis and the splenic vein is patent.  There is a large  coronary vein that supplies collateral flow in the porta hepatis. There is a linear low density that extends from the porta hepatis towards the pancreas.  This could represent some thrombus within a collateral venous structure or related to the biliary system.  IVC and renal veins are patent.  Nonvascular structures:  Again there is a 4 mm nodule in the  right lower lobe on image #1, sequence 8.  This was present on the study from 10/01/2012.  There is a small amount of right pleural fluid or pleural thickening.  Trace amount of fluid along the inferior aspect of the liver.  No suspicious liver lesions.  Multiple calcified gallstones without biliary dilatation.  Again noted is a low density lesion in the pancreatic head that measures 1.5 cm and suspicious for neoplasm.  There is also a 10 mm low density structure in the pancreatic body that could represent a cystic structure.  Again noted is stranding around the central aspect the portal venous system consistent with thrombus.  There is mild edema around the superior mesenteric artery but a fat plane is preserved. No acute abnormality to the spleen.  Low density structures in both kidneys are suggestive for cysts.  Normal appearance of the adrenal tissue.  Small lymph nodes in the upper abdomen are nonspecific. Again noted is mild edema in the abdominal mesentery.  Stable dilatation of the right renal pelvis.  Degenerative disc disease in the lumbar spine.  Review of the MIP images confirms the above findings.  IMPRESSION: The extent of the portal vein thrombosis has minimally changed.  The main left and right portal veins remain patent. There is slightly improved venous flow within the distal mesenteric veins.  Again noted are two lesions within the pancreas.  Lesion in the pancreatic head is suspicious for neoplasm and the lesion within the pancreatic body is an indeterminate cystic lesion.  Mild stranding in the abdominal mesentery is most likely associated with  the mesenteric venous and portal venous thrombus.  This has minimally changed.  Bilateral renal cysts and persistent dilatation of the right renal pelvis.  Cholelithiasis.   Original Report Authenticated By: Richarda Overlie, M.D.   Ct Angio Abdomen W/cm &/or Wo Contrast  10/03/2012   **ADDENDUM** CREATED: 10/03/2012 13:31:08  There is a second 1.5 cm lesion in the head of the pancreas that is indeterminate.  MRI the pancreas is recommended to further characterize.  Alternatively,  trans duodenal ultrasound biopsy is also a consideration.  **END ADDENDUM** SIGNED BY: Marlowe Aschoff. Hoss, M.D.  10/03/2012   *RADIOLOGY REPORT*  Clinical Data:  Portal vein thrombosis  CT ANGIOGRAPHY ABDOMEN  Technique:  Multidetector CT imaging of the abdomen was performed using the standard protocol during bolus administration of intravenous contrast.  Multiplanar reconstructed images including MIPs were obtained and reviewed to evaluate the vascular anatomy.  Contrast: OMNIPAQUE IOHEXOL 350 MG/ML SOLN  Comparison:  10/01/2012  Findings:  There is a stable 1.2 cm lesion in the body of the pancreas on image 80 series 3.  It is relatively hypovascular and may simply represent a cyst but it is too small to characterize.  Stable portal vein thrombosis extending from the portal vein confluence, occluding the entire length of the portal vein, and extending into the superior mesenteric vein as well as some tributaries.  The splenic vein is patent.  Renal veins are patent. IVC is patent.  Aorta is nonaneurysmal and patent with mild atherosclerotic change.  Celiac axis is patent.  Branch vessels are patent.  Accessory left hepatic artery.  SMA is patent with mild atherosclerotic change.  Branch vessels are patent.  IMA is patent.  Branch vessels are diminutive and patent.  Single renal arteries are patent.  Diffuse hepatic steatosis  Cholelithiasis  Several small para-aortic lymph nodes are noted.  None are greater than 1 cm short axis diameter.  Stable right hydronephrosis with a slight delay in contrast excretion from the right kidney.  There is stranding noted about the thrombosed portal vein compatible with inflammatory changes.  Small amount of free fluid about the liver.  Diffuse hepatic steatosis.  Spleen and adrenal glands are unremarkable.  Small bilateral pleural effusions and dependent atelectasis.  Advanced degenerative changes in the lumbar spine without compression deformity.  Review of the MIP images confirms the above findings.  IMPRESSION: Stable portal vein thrombosis as described.  There are associated inflammatory changes.  Stable 1.2 cm hypodensity in the body of the pancreas.  13-month follow-up is recommended as cystic neoplasm is not entirely excluded.  Small pleural effusions have enlarged.  Small amount of free fluid is now more noticeable about the liver.  Stable cholelithiasis.  Stable right hydronephrosis and slightly delayed contrast excretion.  These are secondary findings of partial right ureteral obstruction.  No significant obstruction in the arterial vasculature.   Original Report Authenticated By: Jolaine Click, M.D.   [facility]  10/29/2012   *RADIOLOGY REPORT*  NEW PATIENT OFFICE VISIT - LEVEL II [phone])  Chief Complaint:  Biopsy-proven pancreas cancer, subacute portal vein thrombosis, consult for interventional portal vein thrombolysis  History:  72 year old female with a small 1.2 cm pancreatic body lesion.  Endoscopic biopsy confirmed pancreas carcinoma. CT imaging also demonstrated portal vein thrombosis with surrounding inflammatory stranding and extension into the superior mesenteric vein. The portal vein thrombosis currently makes her a nonoperative candidate for surgical resection.  She is here to discuss transhepatic portal vein thrombolysis/thrombectomy which if successful she could be reconsidered for a Whipple procedure. Currently she remains asymptomatic.  No significant abdominal pain,  nausea, vomiting, weight loss, loss of appetite, or physical limitations.  She remains very active.  E C O G functional status zero.  Past Medical History:  Diabetes, peripheral neuropathy, hyperlipidemia, obesity, hypertension, gastroesophageal reflux, pancreatic carcinoma, portal vein thrombosis  Medications:  See EPIC  Allergies:  Latex, Zithromax, oxycodone/acetaminophen  Social History:  Married, nonsmoker, no alcohol.  Accompanied by her husband and daughter today.  Family History:  Positive for lymphoma, diabetes, headaches, hypertension, kidney disease and thyroid disease  Review of Systems:  No current abdominal pain, nausea, vomiting, weight loss, loss of appetite.  Exam:  Afebrile, vital signs stable.  Obese abdomen.  No organomegaly.  No ascites.  No palpable mass  Today, I spent approximately 20 minutes with the patient and family.  We reviewed the interventional therapy including percutaneous transhepatic access of portal vein to perform thrombolysis/thrombectomy.  The procedure, risks, benefits and alternatives were discussed.  Most recent imaging is from 10/03/2012.  Portal vein thrombus at that time by CT imaging was acute to subacute.  The patient has been on Lovenox without any significant abdominal pain.  I would recommend a repeat CTA abdomen to reevaluate the subacute portal vein thrombosis prior to considering percutaneous intervention and/or surgical resection.  Assessment and Plan:  Biopsy-proven pancreas carcinoma.  Subacute portal vein thrombosis by CT imaging.  The patient is on Lovenox without any significant abdominal pain or symptoms.  Plan:  Repeat CTA abdomen to reevaluate the portal vein thrombus since the patient started Lovenox therapy before considering any intervention or surgical resection.  The findings were discussed with Dr. Donell Beers.   Original Report Authenticated By: Judie Petit. Miles Costain, M.D.       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 Her case at  the complicated by the finding of thrombosis of the portal vein which may prevent the ability to resect the tumor. The patient is scheduled for lysis of this thrombus next week and Dr. Donell Beers will make a final decision on whether the patient can proceed with resection.  I had a long discussion with the patient today regarding the importance of surgery if this is at all achievable in the setting of pancreatic cancer. The patient has a elevated CA 19 9 tumor marker at 527 such that she represent a difficult case in terms of a substantial risk of distant occult disease and also because of the above issues is a difficult surgical candidate.  I discussed with the patient the potential role for radiotherapy in. Settings. We discussed the preoperative treatment as well as "definitive treatment" which typically would be done in conjunction with chemotherapy. The patient is seeing Dr. Truett Perna later today to further discuss systemic treatment options.   PLAN: A definite followup plans for me today. Currently she is proceeding with possible lysis of the portal vein thrombosis and this I believe is her most important issue given the important role of surgery in pancreatic cancer which he understands. The patient and her family really wanted a firm planned today it appeared that unfortunately I will need to see what final decisions are made regarding surgery and also further coordinate her care with Dr. Truett Perna. In the absence of surgery, given her case, she may benefit from initial chemotherapy with possible radiation treatment at a later date depending on her circumstances.    I spent 60 minutes minutes face to face with the patient and more than 50% of that time was spent in counseling and/or coordination of care.    ________________________________   Radene Gunning, MD, PhD

## 2012-11-01 NOTE — Telephone Encounter (Signed)
Noted.  Agree we should follow up with [facility].

## 2012-11-04 NOTE — Telephone Encounter (Signed)
Spoke with Melissa in IR.  MD scheduled to perform procedure is not on campus today.  She is going to page the on call MD and try to find out if they want pt to continue Lovenox bridge.

## 2012-11-04 NOTE — Telephone Encounter (Signed)
Spoke with [employer] in IR.  Dr. Denny Levy wants pt to hold Lovenox on Wed night.  Left message giving pt instructions.

## 2012-11-06 ENCOUNTER — Ambulatory Visit: Payer: 59 | Admitting: Nutrition

## 2012-11-06 ENCOUNTER — Encounter (HOSPITAL_COMMUNITY): Payer: Self-pay | Admitting: Pharmacy Technician

## 2012-11-06 NOTE — Progress Notes (Signed)
Patient is a 72 year old female diagnosed with pancreas cancer.  She is a patient of Dr. Truett Perna.  Past medical history includes hyperlipidemia, diabetes, hypertension, thyroid disease, hypertension, GERD, allergies, and anxiety.  Medications include biotin, Pepcid, Lasix, Lantus, Synthroid, Victoza, Glucophage, Zofran, Protonix, and Zocor.  Labs include glucose of 73 on August 28.  Height: 66.5 inches. Weight: 224.4 pounds September 17. Usual body weight 241 pounds.  05/28/2012. BMI: 35.8.  Patient denies difficulty eating at this time.  Does report early satiety.  She admits that she has been eating less.  She has been trying to eat more protein foods and is including a protein shake daily.  She is eager to receive information on eating during therapy.  Nutrition diagnosis: Unintended weight loss related to inadequate oral intake as evidenced by 7% weight loss from usual body weight over the past 5 months.  Intervention: Patient was educated to consume smaller, more frequent meals with adequate calories and protein to minimize weight loss throughout treatment.  Patient was educated on protein containing foods.  I reviewed options for oral nutrition supplements that patient may want to explore.  I provided samples of these for her to take with her.  I encouraged patient to consume a healthy, plant-based diet with adequate protein and calories as tolerated.  Fact sheets were provided.  Contact information was given.  Teach back method used.   Monitoring, evaluation, goals: Patient will tolerate adequate calories and protein to have minimal weight loss throughout treatment.  She will begin one oral nutrition supplement daily to promote weight maintenance.  Next visit: Patient will contact me with questions or concerns.  She will let me know when her treatment plan has been finalized.

## 2012-11-07 ENCOUNTER — Ambulatory Visit (HOSPITAL_COMMUNITY)
Admission: RE | Admit: 2012-11-07 | Discharge: 2012-11-07 | Disposition: A | Discharge status: Home / Self Care | Payer: 59 | Source: Ambulatory Visit | Attending: Interventional Radiology | Admitting: Interventional Radiology

## 2012-11-07 ENCOUNTER — Other Ambulatory Visit (HOSPITAL_COMMUNITY): Payer: Self-pay | Admitting: Interventional Radiology

## 2012-11-07 ENCOUNTER — Encounter (HOSPITAL_COMMUNITY): Payer: Self-pay

## 2012-11-07 ENCOUNTER — Other Ambulatory Visit: Payer: Self-pay | Admitting: Family Medicine

## 2012-11-07 DIAGNOSIS — C259 Malignant neoplasm of pancreas, unspecified: Secondary | ICD-10-CM

## 2012-11-07 DIAGNOSIS — I81 Portal vein thrombosis: Secondary | ICD-10-CM | POA: Insufficient documentation

## 2012-11-07 DIAGNOSIS — K55059 Acute (reversible) ischemia of intestine, part and extent unspecified: Secondary | ICD-10-CM | POA: Insufficient documentation

## 2012-11-07 DIAGNOSIS — Z794 Long term (current) use of insulin: Secondary | ICD-10-CM | POA: Insufficient documentation

## 2012-11-07 DIAGNOSIS — I10 Essential (primary) hypertension: Secondary | ICD-10-CM | POA: Insufficient documentation

## 2012-11-07 DIAGNOSIS — E119 Type 2 diabetes mellitus without complications: Secondary | ICD-10-CM | POA: Insufficient documentation

## 2012-11-07 LAB — COMPREHENSIVE METABOLIC PANEL
AST: 22 U/L (ref 0–37)
Albumin: 3.6 g/dL (ref 3.5–5.2)
Alkaline Phosphatase: 62 U/L (ref 39–117)
Chloride: 104 mEq/L (ref 96–112)
Potassium: 3.5 mEq/L (ref 3.5–5.1)
Sodium: 139 mEq/L (ref 135–145)
Total Bilirubin: 0.3 mg/dL (ref 0.3–1.2)
Total Protein: 6.9 g/dL (ref 6.0–8.3)

## 2012-11-07 LAB — CBC
MCHC: 32.4 g/dL (ref 30.0–36.0)
Platelets: 148 10*3/uL — ABNORMAL LOW (ref 150–400)
RDW: 16 % — ABNORMAL HIGH (ref 11.5–15.5)
WBC: 3.8 10*3/uL — ABNORMAL LOW (ref 4.0–10.5)

## 2012-11-07 LAB — GLUCOSE, CAPILLARY: Glucose-Capillary: 96 mg/dL (ref 70–99)

## 2012-11-07 MED ORDER — HYDROCODONE-ACETAMINOPHEN 5-325 MG PO TABS: 1.0000 | ORAL_TABLET | ORAL | Status: DC | PRN

## 2012-11-07 MED ORDER — ALTEPLASE 100 MG IV SOLR
10.0000 mg | Freq: Once | INTRAVENOUS | Status: DC
  Filled 2012-11-07: qty 10

## 2012-11-07 MED ORDER — ENOXAPARIN SODIUM 120 MG/0.8ML ~~LOC~~ SOLN
110.0000 mg | Freq: Two times a day (BID) | SUBCUTANEOUS | Status: DC
Start: 2012-11-07 — End: 2012-11-14

## 2012-11-07 MED ORDER — IOHEXOL 300 MG/ML  SOLN: 100.0000 mL | Freq: Once | INTRAMUSCULAR | Status: AC | PRN

## 2012-11-07 MED ORDER — SODIUM CHLORIDE 0.9 % IV SOLN: Freq: Once | INTRAVENOUS | Status: DC

## 2012-11-07 MED ORDER — PROMETHAZINE HCL 25 MG/ML IJ SOLN: 12.5000 mg | Freq: Four times a day (QID) | INTRAMUSCULAR | Status: DC | PRN

## 2012-11-07 MED ORDER — FENTANYL CITRATE 0.05 MG/ML IJ SOLN
INTRAMUSCULAR | Status: AC | PRN
  Administered 2012-11-07 (×2): 50 ug via INTRAVENOUS
  Administered 2012-11-07: 25 ug via INTRAVENOUS
  Administered 2012-11-07: 50 ug via INTRAVENOUS
  Administered 2012-11-07 (×2): 25 ug via INTRAVENOUS

## 2012-11-07 MED ORDER — MIDAZOLAM HCL 2 MG/2ML IJ SOLN
INTRAMUSCULAR | Status: AC | PRN
  Administered 2012-11-07: 1 mg via INTRAVENOUS
  Administered 2012-11-07: 0.5 mg via INTRAVENOUS
  Administered 2012-11-07 (×2): 1 mg via INTRAVENOUS
  Administered 2012-11-07: 2 mg via INTRAVENOUS
  Administered 2012-11-07: 0.5 mg via INTRAVENOUS

## 2012-11-07 MED ORDER — FENTANYL CITRATE 0.05 MG/ML IJ SOLN
INTRAMUSCULAR | Status: AC
  Filled 2012-11-07: qty 4

## 2012-11-07 MED ORDER — MIDAZOLAM HCL 2 MG/2ML IJ SOLN
INTRAMUSCULAR | Status: AC
  Filled 2012-11-07: qty 4

## 2012-11-07 MED ORDER — IOHEXOL 300 MG/ML  SOLN
150.0000 mL | Freq: Once | INTRAMUSCULAR | Status: AC | PRN
  Administered 2012-11-07: 40 mL via INTRAVENOUS

## 2012-11-07 MED ORDER — GELATIN ABSORBABLE 12-7 MM EX MISC
CUTANEOUS | Status: AC
  Filled 2012-11-07: qty 1

## 2012-11-07 MED ORDER — SODIUM CHLORIDE 0.9 % IV SOLN
INTRAVENOUS | Status: DC
  Filled 2012-11-07 (×4): qty 40

## 2012-11-07 MED ORDER — ENOXAPARIN SODIUM 120 MG/0.8ML ~~LOC~~ SOLN: 110.0000 mg | Freq: Two times a day (BID) | SUBCUTANEOUS | Status: DC

## 2012-11-07 MED ORDER — ONDANSETRON HCL 4 MG/2ML IJ SOLN: 8.0000 mg | Freq: Four times a day (QID) | INTRAMUSCULAR | Status: DC | PRN

## 2012-11-07 NOTE — Telephone Encounter (Signed)
Pt is to stay on Lovenox x 1 week until she sees Dr. Myrle Sheng next Thursday.

## 2012-11-07 NOTE — Procedures (Signed)
Interventional Radiology Procedure Note  Operators: Ruel Favors, MD and Malachy Moan, MD Procedure: Percutaneous transhepatic portal and SMV venogram. Access: 47F sheath, gel-foam torpedo tract embolization Complications: None Recommendations:  - Bedrest x 4 hours - Pain and nausea control PRN  Signed,  Sterling Big, MD [facility]

## 2012-11-07 NOTE — Telephone Encounter (Signed)
Pt is to stay on Lovenox x 1 week until she sees Dr. Sherill next Thursday.   

## 2012-11-07 NOTE — H&P (Signed)
Chief Complaint: "I'm here for a blood clot treatment" Referring Physician:Sherrill HPI: Theresa Summers is an 72 y.o. female with pancreatic cancer and findings of portal vein thrombosis. She was started on Lovenox but has also seen Dr. Miles Costain in consult for consideration of portal vein thromobolysis. See PACS IR Eval for details. She feels well this am. PMHx and meds reviewed.  Past Medical History:  Past Medical History  Diagnosis Date  . Trigger finger   . Hyperlipidemia   . Diabetes mellitus   . Chronic venous insufficiency   . Morbid obesity   . Hypertension   . Thyroid disease   . GERD (gastroesophageal reflux disease)   . Hypothyroidism   . Arthritis   . Anemia   . Common bile duct dilatation 10/01/12  . Fatty liver 10/01/12  . Portal vein thrombosis   . Cholelithiasis   . Pancreatic lesion 10/01/12  . Hydroureteronephrosis     right  . Pancreatic mass 2014  . Cancer 10/17/12     pancreatic   . Allergy   . Anxiety     Past Surgical History:  Past Surgical History  Procedure Laterality Date  . Lumbar laminectomy    . Breast biopsy    . Eus N/A 10/17/2012    Procedure: UPPER ENDOSCOPIC ULTRASOUND (EUS) LINEAR;  Surgeon: Rachael Fee, MD;  Location: [facility];  Service: Endoscopy;  Laterality: N/A;    Family History:  Family History  Problem Relation Age of Onset  . Heart disease Father   . Arthritis Father   . Breast cancer Mother   . Lymphoma Sister     #1  . Diabetes Sister     #1  . Diabetes Sister     #2  . Diabetes Brother     #1  . Diabetes Brother     #2  . Diabetes Other     siblings  . Hypertension Other     siblings  . Arthritis Other     siblings    Social History:  reports that she has never smoked. She has never used smokeless tobacco. She reports that she does not drink alcohol or use illicit drugs.  Allergies:  Allergies  Allergen Reactions  . Latex Rash  . Oxycodone-Acetaminophen Nausea Only  . Zithromax [Azithromycin]  Other (See Comments)    thrush    Medications:   Medication List    ASK your doctor about these medications       acetaminophen 325 MG tablet  Commonly known as:  TYLENOL  Take 650 mg by mouth daily as needed for pain. For pain     Biotin 5000 MCG Caps  Take 5,000 mcg by mouth at bedtime.     doxazosin 8 MG tablet  Commonly known as:  CARDURA  Take 8 mg by mouth every morning.     enoxaparin 120 MG/0.8ML injection  Commonly known as:  LOVENOX  Inject 0.73 mLs (110 mg total) into the skin every 12 (twelve) hours.     escitalopram 20 MG tablet  Commonly known as:  LEXAPRO  Take 20 mg by mouth every morning.     famotidine 20 MG tablet  Commonly known as:  PEPCID  Take 20 mg by mouth at bedtime.     fesoterodine 4 MG Tb24 tablet  Commonly known as:  TOVIAZ  Take 4 mg by mouth at bedtime.     furosemide 40 MG tablet  Commonly known as:  LASIX  Take 40 mg by  mouth every morning.     LANTUS SOLOSTAR 100 UNIT/ML Sopn  Generic drug:  Insulin Glargine  Inject 70 Units into the skin every morning.     levothyroxine 88 MCG tablet  Commonly known as:  SYNTHROID, LEVOTHROID  Take 88 mcg by mouth daily before breakfast.     loratadine 10 MG tablet  Commonly known as:  CLARITIN  Take 10 mg by mouth every evening.     losartan 25 MG tablet  Commonly known as:  COZAAR  Take 25 mg by mouth every morning.     metFORMIN 500 MG tablet  Commonly known as:  GLUCOPHAGE  Take 1,000 mg by mouth 2 (two) times daily with a meal.     ondansetron 4 MG tablet  Commonly known as:  ZOFRAN  Take 1 tablet (4 mg total) by mouth every 8 (eight) hours as needed for nausea.     pantoprazole 40 MG tablet  Commonly known as:  PROTONIX  Take 40 mg by mouth every morning.     potassium chloride 20 MEQ packet  Commonly known as:  KLOR-CON  Take 20 mEq by mouth every morning.     pregabalin 75 MG capsule  Commonly known as:  LYRICA  Take 75-150 mg by mouth 2 (two) times daily. 75mg  in  tje morning and 150mg  at bedtime     pregabalin 75 MG capsule  Commonly known as:  LYRICA  Take 75-150 mg by mouth 2 (two) times daily. Take one capsule in the am and two capsules at bedtime     simvastatin 20 MG tablet  Commonly known as:  ZOCOR  Take 20 mg by mouth at bedtime.     traMADol 50 MG tablet  Commonly known as:  ULTRAM  Take 50 mg by mouth every 6 (six) hours as needed for pain.     traZODone 50 MG tablet  Commonly known as:  DESYREL  Take 50 mg by mouth at bedtime.     VICTOZA 18 MG/3ML Sopn  Generic drug:  Liraglutide  Inject 1.8 mg into the skin daily.        Please HPI for pertinent positives, otherwise complete 10 system ROS negative.  Physical Exam: BP 141/64  Pulse 81  Temp(Src) 98.2 F (36.8 C) (Oral)  Resp 18  Ht 5' 6.5" (1.689 m)  Wt 224 lb (101.606 kg)  BMI 35.62 kg/m2  SpO2 95% Body mass index is 35.62 kg/(m^2).   General Appearance:  Alert, cooperative, no distress, appears stated age  Head:  Normocephalic, without obvious abnormality, atraumatic  ENT: Unremarkable  Neck: Supple, symmetrical, trachea midline  Lungs:   Clear to auscultation bilaterally, no w/r/r.  Chest Wall:  No tenderness or deformity  Heart:  Regular rate and rhythm, S1, S2 normal, no murmur, rub or gallop.  Abdomen:   Soft, non-tender, non distended.  Extremities: Extremities normal, atraumatic, no cyanosis or edema  Pulses: 2+ and symmetric  Neurologic: Normal affect, no gross deficits.   Results for orders placed during the hospital encounter of 11/07/12 (from the past 48 hour(s))  CBC     Status: Abnormal   Collection Time    11/07/12  6:53 AM      Result Value Range   WBC 3.8 (*) 4.0 - 10.5 K/uL   RBC 4.01  3.87 - 5.11 MIL/uL   Hemoglobin 10.9 (*) 12.0 - 15.0 g/dL   HCT 16.1 (*) 09.6 - 04.5 %   MCV 83.8  78.0 - 100.0 fL  MCH 27.2  26.0 - 34.0 pg   MCHC 32.4  30.0 - 36.0 g/dL   RDW 62.1 (*) 30.8 - 65.7 %   Platelets 148 (*) 150 - 400 K/uL  COMPREHENSIVE  METABOLIC PANEL     Status: Abnormal   Collection Time    11/07/12  6:53 AM      Result Value Range   Sodium 139  135 - 145 mEq/L   Potassium 3.5  3.5 - 5.1 mEq/L   Chloride 104  96 - 112 mEq/L   CO2 25  19 - 32 mEq/L   Glucose, Bld 105 (*) 70 - 99 mg/dL   BUN 12  6 - 23 mg/dL   Creatinine, Ser 8.46  0.50 - 1.10 mg/dL   Calcium 9.6  8.4 - 96.2 mg/dL   Total Protein 6.9  6.0 - 8.3 g/dL   Albumin 3.6  3.5 - 5.2 g/dL   AST 22  0 - 37 U/L   ALT 19  0 - 35 U/L   Alkaline Phosphatase 62  39 - 117 U/L   Total Bilirubin 0.3  0.3 - 1.2 mg/dL   GFR calc non Af Amer >90  >90 mL/min   GFR calc Af Amer >90  >90 mL/min   Comment: (NOTE)     The eGFR has been calculated using the CKD EPI equation.     This calculation has not been validated in all clinical situations.     eGFR's persistently <90 mL/min signify possible Chronic Kidney     Disease.  PROTIME-INR     Status: None   Collection Time    11/07/12  6:53 AM      Result Value Range   Prothrombin Time 12.1  11.6 - 15.2 seconds   INR 0.91  0.00 - 1.49  GLUCOSE, CAPILLARY     Status: Abnormal   Collection Time    11/07/12  7:45 AM      Result Value Range   Glucose-Capillary 100 (*) 70 - 99 mg/dL   No results found.  Assessment/Plan Portal vein thrombosis Pancreatic cancer For Transhepatic portal vein thrombolysis. Discussed/reviewed procedure with pt and family, including risks, complications, use of sedation. Possible plan for overnight observation. Labs reviewed. Consent signed in chart  Brayton El PA-C 11/07/2012, 8:01 AM

## 2012-11-12 ENCOUNTER — Ambulatory Visit (INDEPENDENT_AMBULATORY_CARE_PROVIDER_SITE_OTHER): Payer: 59 | Admitting: Internal Medicine

## 2012-11-12 ENCOUNTER — Encounter: Payer: Self-pay | Admitting: Internal Medicine

## 2012-11-12 VITALS — BP 110/70 | HR 84 | Ht 66.5 in | Wt 224.6 lb

## 2012-11-12 DIAGNOSIS — D509 Iron deficiency anemia, unspecified: Secondary | ICD-10-CM

## 2012-11-12 DIAGNOSIS — I81 Portal vein thrombosis: Secondary | ICD-10-CM

## 2012-11-12 DIAGNOSIS — E538 Deficiency of other specified B group vitamins: Secondary | ICD-10-CM

## 2012-11-12 DIAGNOSIS — D490 Neoplasm of unspecified behavior of digestive system: Secondary | ICD-10-CM

## 2012-11-12 MED ORDER — ALPRAZOLAM 0.5 MG PO TABS: 0.5000 mg | ORAL_TABLET | Freq: Every day | ORAL | Status: DC

## 2012-11-12 MED ORDER — CYANOCOBALAMIN 1000 MCG/ML IJ SOLN: 1000.0000 ug | INTRAMUSCULAR | Status: DC

## 2012-11-12 MED ORDER — MEGESTROL ACETATE 40 MG/ML PO SUSP: 200.0000 mg | Freq: Every day | ORAL | Status: DC

## 2012-11-12 MED ORDER — CYANOCOBALAMIN 1000 MCG/ML IJ SOLN
1000.0000 ug | Freq: Once | INTRAMUSCULAR | Status: AC
  Administered 2012-11-12: 1000 ug via INTRAMUSCULAR

## 2012-11-12 MED ORDER — MAXFE 160-1 MG PO TABS: 1.0000 | ORAL_TABLET | Freq: Every day | ORAL | Status: DC

## 2012-11-12 NOTE — Patient Instructions (Addendum)
We have sent the following medications to your pharmacy for you to pick up at your convenience: MaxFe (iron)-1 tablet daily Megace-1 tsp (5 ml) daily Xanax 0.5 mg-Take 1 tablet every night  We have given you a b12 injection today.  We have placed orders in Epic for Dr Dayton Martes to give you b12 injections monthly x 6 ,then recheck B12 levels   Please follow up with Dr Juanda Chance in 3 months.  CC: Dr Ruthe Mannan, Dr Brunilda Payor, Dr Truett Perna, Dr Mitzi Hansen

## 2012-11-12 NOTE — Progress Notes (Signed)
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 There is no association or extension with the portal vein. She had a portal vein thrombosis and SMV thrombosis as well. She had unsuccessful attempt for hepatic thrombolysis and thrombectomy by Dr Denny Levy in interventional radiology. She had extensive peripancreatic and periduodenal collaterals. She is having pain with meals. She has lost about 16 pounds because of decreased appetite and pain for which she takes tramadol. He has problems sleeping. There has been no jaundice. Her stools soft and yellow ,she has an appointment with Dr.Sherrill this week for chemotherapy and will be starting radiation by Dr.Moody as soon as possible. She is not a surgical candidate as per Dr Donell Beers due to portal vein thrombosis and collaterals. Her complaint is abdominal pain, lack of energy and decreased appetite. She has been anemic      Past Medical History  Diagnosis Date  . Trigger finger   . Hyperlipidemia   . Diabetes mellitus   . Chronic venous insufficiency   . Morbid obesity   . Hypertension   . Thyroid disease   . GERD (gastroesophageal reflux disease)   . Hypothyroidism   . Arthritis   . Anemia   . Common bile duct dilatation 10/01/12  . Fatty liver 10/01/12  . Portal vein thrombosis   . Cholelithiasis   . Pancreatic lesion 10/01/12  . Hydroureteronephrosis     right  . Pancreatic mass 2014  . Cancer 10/17/12     pancreatic   . Allergy   . Anxiety    Past Surgical History  Procedure Laterality Date  . Lumbar laminectomy    . Breast biopsy    . Eus N/A 10/17/2012    Procedure: UPPER ENDOSCOPIC ULTRASOUND (EUS) LINEAR;  Surgeon: Rachael Fee, MD;  Location: [facility];  Service: Endoscopy;  Laterality: N/A;    reports that  she has never smoked. She has never used smokeless tobacco. She reports that she does not drink alcohol or use illicit drugs. family history includes Arthritis in her father and other; Breast cancer in her mother; Diabetes in her brother, brother, other, sister, and sister; Heart disease in her father; Hypertension in her other; Lymphoma in her sister. Allergies  Allergen Reactions  . Latex Rash  . Oxycodone-Acetaminophen Nausea Only  . Zithromax [Azithromycin] Other (See Comments)    thrush        Review of Systems: negative for fever. Positive for weight loss   The remainder of the 10 point ROS is negative except as outlined in H&P   Physical Exam: General appearance  Well developed, in no distress. mildly overweight  Eyes- non icteric. HEENT nontraumatic, normocephalic. Mouth no lesions, tongue papillated, no cheilosis. Neck supple without adenopathy, thyroid not enlarged, no carotid bruits, no JVD. Lungs Clear to auscultation bilaterally. Cor normal S1, normal S2, regular rhythm, no murmur,  quiet precordium. Abdomen:  multiple ecchymoses from Lovenox injections. Active bowel sounds, mild tenderness to the level of the umbilicus. No ascites   Rectal: not done  Extremities no pedal edema. Skin no lesions. Neurological alert and oriented x 3. Psychological normal mood and affect.  Assessment and Plan:  72 year old white female with new diagnosis of adenocarcinoma of the pancreas. She will be starting on chemotherapy and  radiation  next week . She will be followed by Dr. Truett Perna ,oncology. I will see her in 3 months. We will give her B12 thousand micrograms IM, prescription for iron supplements for anemia, Megace 1 teaspoon daily for appetite stimulation and Xanax 0.5 mg one at bedtime for rest.   11/12/2012 Theresa Summers

## 2012-11-14 ENCOUNTER — Other Ambulatory Visit (INDEPENDENT_AMBULATORY_CARE_PROVIDER_SITE_OTHER): Payer: Self-pay | Admitting: General Surgery

## 2012-11-14 ENCOUNTER — Ambulatory Visit (HOSPITAL_BASED_OUTPATIENT_CLINIC_OR_DEPARTMENT_OTHER): Payer: 59 | Admitting: Oncology

## 2012-11-14 VITALS — BP 135/55 | HR 90 | Temp 97.6°F | Resp 18 | Ht 66.0 in | Wt 224.5 lb

## 2012-11-14 DIAGNOSIS — I81 Portal vein thrombosis: Secondary | ICD-10-CM

## 2012-11-14 DIAGNOSIS — N1330 Unspecified hydronephrosis: Secondary | ICD-10-CM

## 2012-11-14 DIAGNOSIS — C259 Malignant neoplasm of pancreas, unspecified: Secondary | ICD-10-CM

## 2012-11-14 MED ORDER — ENOXAPARIN SODIUM 120 MG/0.8ML ~~LOC~~ SOLN: 110.0000 mg | Freq: Two times a day (BID) | SUBCUTANEOUS | Status: DC

## 2012-11-14 NOTE — Progress Notes (Signed)
[facility]    OFFICE PROGRESS NOTE   INTERVAL HISTORY:   She returns for scheduled followup with pancreas cancer. Ms. Gondek continues to have abdominal pain and malaise. She has a poor appetite. She was started on Megace by Dr. Juanda Chance.  She continues Lovenox anticoagulation. She does not like the Lovenox injections.  On 11/07/2012 she underwent a transhepatic portogram/venogram that confirmed a portal vein thrombosis and extensive portosystemic collateral veins.  Her case was presented at the GI tumor conference on 11/13/2012. An attempt at resection of the pancreas mass is not recommended.  Objective:  Vital signs in last 24 hours:  Blood pressure 135/55, pulse 90, temperature 97.6 F (36.4 C), temperature source Oral, resp. rate 18, height 5\' 6"  (1.676 m), weight 224 lb 8 oz (101.833 kg).    Resp: Lungs clear bilaterally Cardio: Regular rate and rhythm GI: Mildly distended, no hepatomegaly, no mass Vascular: No leg edema  Skin: Induration/ecchymoses at the sites of Lovenox injections at the anterior abdominal wall    Lab Results:  Lab Results  Component Value Date   WBC 3.8* 11/07/2012   HGB 10.9* 11/07/2012   HCT 33.6* 11/07/2012   MCV 83.8 11/07/2012   PLT 148* 11/07/2012    Medications: I have reviewed the patient's current medications.  Assessment/Plan: 1. Adenocarcinoma pancreas, pancreas head mass, endoscopic ultrasound on 10/17/2012 confirmed a pancreas head mass without vascular invasion or surrounding lymphadenopathy, biopsy positive for adenocarcinoma.  -Elevated CA 19-9  2. Portal vein thrombosis-maintained on Lovenox , extensive collateral vessel formation confirmed on a venogram 11/07/2012 3. Diabetes  4. Right hydronephrosis-? Related to history of kidney stones versus carcinomatosis  5. "Cirrhotic "changes and splenomegaly reported on the chest CT 10/25/2012  6. History of kidney stones  7. Hypothyroidism  8. Normocytic anemia-?  Anemia of chronic disease 9. 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 Pain secondary to pancreas cancer-adequate relief with tramadol at present   Disposition:  Ms. Soman underwent a venogram procedure that documented portosystemic venous collaterals and she will not be a candidate for a Whipple procedure. I discussed treatment options with Ms. Vialpando and her family. Her case was presented at the GI tumor conference on 11/13/2012. The plan is to proceed with systemic chemotherapy. Radiation will be considered in the future if the tumor remains localized.  I recommend gemcitabine/Abraxane chemotherapy. We discussed the potential toxicities associated with this chemotherapy regimen including the chance for nausea/vomiting, mucositis, diarrhea, alopecia, and hematologic toxicity. We discussed the fever, allergic reaction, rash, and pneumonitis associated with gemcitabine. We reviewed the neuropathy, alopecia, and allergic reaction seen with Abraxane. She has pre-existing neuropathy from diabetes. We will monitor her for worsening of neuropathy.  She would like to begin chemotherapy as soon as possible. We made a referral to Dr. Donell Beers for placement of a Port-A-Cath.  Ms. Driskill is at high-risk of developing recurrent/progressive deep vein thrombosis in the setting of pancreas cancer. I recommend continuing anticoagulation therapy. She does not want to continue Lovenox. We will make a referral to the cancer Center anticoagulation clinic to begin Coumadin anticoagulation once the Port-A-Cath is in place.  Ms. Notarianni will attend a chemotherapy teaching class on 11/18/2012. The plan is to begin gemcitabine/Abraxane on a three out of four week schedule on 11/21/2012. She will return for an office visit on 12/05/2012.  The etiology of the right hydronephrosis has not been determined. She will be referred to Dr. Brunilda Payor for further evaluation.  Approximate 45 minutes were  spent with the patient and her family today. The majority of the time was used for counseling/coordination of care.   Thornton Papas, MD  11/14/2012  5:50 PM

## 2012-11-14 NOTE — Progress Notes (Signed)
Blood sugar this am in 50's. Encouraged patient to eat high protein snack at bedtime and have snack at bedside always. She will be seeing endocrinologist soon in follow up.

## 2012-11-15 ENCOUNTER — Telehealth: Payer: Self-pay | Admitting: Oncology

## 2012-11-15 ENCOUNTER — Telehealth: Payer: Self-pay | Admitting: Pharmacist

## 2012-11-15 ENCOUNTER — Telehealth: Payer: Self-pay | Admitting: *Deleted

## 2012-11-15 MED ORDER — WARFARIN SODIUM 10 MG PO TABS: ORAL_TABLET | ORAL | Status: DC

## 2012-11-15 NOTE — Telephone Encounter (Signed)
Per staff message and POF I have scheduled appts.  JMW  

## 2012-11-15 NOTE — Telephone Encounter (Signed)
Talked to pt and gave her appt for lab, Dr. Brunilda Payor, chemo,chemo education, DR, Donell Beers has been contacted and they will pt for portacath, pt aware of all appt

## 2012-11-15 NOTE — Telephone Encounter (Signed)
New Coumadin Clinic patient (she has been on coumadin in the past briefly) She is currently on Lovenox for portal vein thrombosis but does not like giving herself the injections She would like to change over to coumadin She is having a port placed sometime next week (tuesday most likely) She will not start coumadin until after the port is placed She was on 10 mg of coumadin daily in the past will order this for her pharmacy to fill Instructed patient on the need to take both coumadin and lovenox until her INR is > 2 We will set up to see her on Tuesday morning at 11:45am prior to her chemo education class to discuss her beginning coumadin and follow up on the port placement She will continue her lovenox twice daily for now.

## 2012-11-18 ENCOUNTER — Telehealth: Payer: Self-pay | Admitting: *Deleted

## 2012-11-18 ENCOUNTER — Encounter (HOSPITAL_COMMUNITY): Payer: Self-pay | Admitting: *Deleted

## 2012-11-18 ENCOUNTER — Telehealth (INDEPENDENT_AMBULATORY_CARE_PROVIDER_SITE_OTHER): Payer: Self-pay | Admitting: General Surgery

## 2012-11-18 ENCOUNTER — Other Ambulatory Visit: Payer: Self-pay | Admitting: *Deleted

## 2012-11-18 ENCOUNTER — Telehealth: Payer: Self-pay | Admitting: Oncology

## 2012-11-18 MED ORDER — HYDROCODONE-ACETAMINOPHEN 5-325 MG PO TABS: 1.0000 | ORAL_TABLET | Freq: Four times a day (QID) | ORAL | Status: DC | PRN

## 2012-11-18 NOTE — Progress Notes (Signed)
Patient stated she is unable to eat due to pain.  Patient states she is able to tolerate ensure.  Instructed patient to drink ensure at bedtime tonite prior to midnite if able to tolerate.  Patient voiced understanding.

## 2012-11-18 NOTE — Progress Notes (Signed)
Patient asked regarding instructions regarding Lovenox.  Patient was instructed to call office of Dr Donell Beers regarding as to when to start and stop Lovenox.  Patient voiced understanding.  Patient was given phone number to call office of [phone].

## 2012-11-18 NOTE — Telephone Encounter (Signed)
Pt called to confirm she should wait to start her Warfarin until AFTER the [facility] surgery tomorrow.  Acknowledged this is correct on her part to wait.  She can take it tomorrow pm.

## 2012-11-18 NOTE — Telephone Encounter (Signed)
Received phone call from Dr. Arita Miss office stating that the port is going to be placed tomorrow, 11/19/12.  Patient needs to be re-scheduled for the lab and chemo class that is currently due 11/19/12.  Will let MD and his nurse know.

## 2012-11-18 NOTE — Telephone Encounter (Signed)
Called pt, she reports Tramadol is ineffective for her pain. Has taken Vicodin in the past. Order from Dr. Truett Perna for Vicodin PRN pain sent to [facility].

## 2012-11-18 NOTE — Telephone Encounter (Signed)
Gave pt appt for lab and MD for September 2014 °

## 2012-11-18 NOTE — Telephone Encounter (Signed)
Patient called stating her pain medication is not working, especially at night.  She would like something else.  Will notify Dr. Kalman Drape nurse.  Informed patient someone from the office will cal her back.

## 2012-11-19 ENCOUNTER — Other Ambulatory Visit: Payer: 59

## 2012-11-19 ENCOUNTER — Ambulatory Visit (HOSPITAL_COMMUNITY)
Admission: RE | Admit: 2012-11-19 | Discharge: 2012-11-19 | Disposition: A | Discharge status: Home / Self Care | Payer: 59 | Source: Ambulatory Visit | Attending: General Surgery | Admitting: General Surgery

## 2012-11-19 ENCOUNTER — Other Ambulatory Visit: Payer: 59 | Admitting: Lab

## 2012-11-19 ENCOUNTER — Encounter (HOSPITAL_COMMUNITY): Payer: Self-pay | Admitting: Anesthesiology

## 2012-11-19 ENCOUNTER — Telehealth: Payer: Self-pay | Admitting: Pharmacist

## 2012-11-19 ENCOUNTER — Encounter: Payer: 59 | Admitting: Nutrition

## 2012-11-19 ENCOUNTER — Encounter (HOSPITAL_COMMUNITY): Payer: Self-pay | Admitting: *Deleted

## 2012-11-19 ENCOUNTER — Ambulatory Visit (HOSPITAL_COMMUNITY): Payer: 59

## 2012-11-19 ENCOUNTER — Telehealth: Payer: Self-pay | Admitting: Oncology

## 2012-11-19 ENCOUNTER — Ambulatory Visit (HOSPITAL_COMMUNITY): Payer: 59 | Admitting: Anesthesiology

## 2012-11-19 ENCOUNTER — Telehealth: Payer: Self-pay | Admitting: *Deleted

## 2012-11-19 ENCOUNTER — Encounter (HOSPITAL_COMMUNITY)
Admission: RE | Disposition: A | Discharge status: Home / Self Care | Payer: Self-pay | Source: Ambulatory Visit | Attending: General Surgery

## 2012-11-19 DIAGNOSIS — E785 Hyperlipidemia, unspecified: Secondary | ICD-10-CM | POA: Insufficient documentation

## 2012-11-19 DIAGNOSIS — C259 Malignant neoplasm of pancreas, unspecified: Secondary | ICD-10-CM | POA: Insufficient documentation

## 2012-11-19 DIAGNOSIS — K219 Gastro-esophageal reflux disease without esophagitis: Secondary | ICD-10-CM | POA: Insufficient documentation

## 2012-11-19 DIAGNOSIS — N1330 Unspecified hydronephrosis: Secondary | ICD-10-CM

## 2012-11-19 DIAGNOSIS — E119 Type 2 diabetes mellitus without complications: Secondary | ICD-10-CM | POA: Insufficient documentation

## 2012-11-19 DIAGNOSIS — Z794 Long term (current) use of insulin: Secondary | ICD-10-CM | POA: Insufficient documentation

## 2012-11-19 DIAGNOSIS — I10 Essential (primary) hypertension: Secondary | ICD-10-CM | POA: Insufficient documentation

## 2012-11-19 DIAGNOSIS — I81 Portal vein thrombosis: Secondary | ICD-10-CM | POA: Insufficient documentation

## 2012-11-19 DIAGNOSIS — Z7901 Long term (current) use of anticoagulants: Secondary | ICD-10-CM | POA: Insufficient documentation

## 2012-11-19 HISTORY — PX: PORTACATH PLACEMENT: SHX2246

## 2012-11-19 HISTORY — DX: Myoneural disorder, unspecified: G70.9

## 2012-11-19 LAB — URINALYSIS, ROUTINE W REFLEX MICROSCOPIC
Bilirubin Urine: NEGATIVE
Glucose, UA: NEGATIVE mg/dL
Ketones, ur: 15 mg/dL — AB
Nitrite: NEGATIVE
Urobilinogen, UA: 1 mg/dL (ref 0.0–1.0)
pH: 6 (ref 5.0–8.0)

## 2012-11-19 LAB — CBC
Hemoglobin: 10.6 g/dL — ABNORMAL LOW (ref 12.0–15.0)
MCHC: 31.9 g/dL (ref 30.0–36.0)
MCV: 82.8 fL (ref 78.0–100.0)
WBC: 4.8 10*3/uL (ref 4.0–10.5)

## 2012-11-19 LAB — BASIC METABOLIC PANEL
BUN: 12 mg/dL (ref 6–23)
Chloride: 103 mEq/L (ref 96–112)
GFR calc Af Amer: >90 mL/min (ref >90)
GFR calc non Af Amer: >90 mL/min (ref >90)
Glucose, Bld: 128 mg/dL — ABNORMAL HIGH (ref 70–99)
Potassium: 3.8 mEq/L (ref 3.5–5.1)
Sodium: 137 mEq/L (ref 135–145)

## 2012-11-19 LAB — URINE MICROSCOPIC-ADD ON

## 2012-11-19 LAB — GLUCOSE, CAPILLARY: Glucose-Capillary: 140 mg/dL — ABNORMAL HIGH (ref 70–99)

## 2012-11-19 LAB — APTT: aPTT: 32 seconds (ref 24–37)

## 2012-11-19 LAB — PROTIME-INR: INR: 1.03 (ref 0.00–1.49)

## 2012-11-19 SURGERY — INSERTION, TUNNELED CENTRAL VENOUS DEVICE, WITH PORT
Anesthesia: General | Site: Chest | Laterality: Left | Wound class: Clean

## 2012-11-19 MED ORDER — HEPARIN SOD (PORK) LOCK FLUSH 100 UNIT/ML IV SOLN
INTRAVENOUS | Status: AC
  Filled 2012-11-19: qty 5

## 2012-11-19 MED ORDER — SODIUM CHLORIDE 0.9 % IV SOLN: 250.0000 mL | INTRAVENOUS | Status: DC | PRN

## 2012-11-19 MED ORDER — BUPIVACAINE HCL (PF) 0.25 % IJ SOLN
INTRAMUSCULAR | Status: DC | PRN
  Administered 2012-11-19: 10 mL

## 2012-11-19 MED ORDER — SODIUM CHLORIDE 0.9 % IR SOLN
Freq: Once | Status: AC
  Administered 2012-11-19: 11:00:00
  Filled 2012-11-19: qty 1.2

## 2012-11-19 MED ORDER — SODIUM CHLORIDE 0.9 % IJ SOLN: 3.0000 mL | Freq: Two times a day (BID) | INTRAMUSCULAR | Status: DC

## 2012-11-19 MED ORDER — LIDOCAINE HCL (CARDIAC) 20 MG/ML IV SOLN
INTRAVENOUS | Status: DC | PRN
  Administered 2012-11-19: 80 mg via INTRAVENOUS

## 2012-11-19 MED ORDER — FENTANYL CITRATE 0.05 MG/ML IJ SOLN: 25.0000 ug | INTRAMUSCULAR | Status: DC | PRN

## 2012-11-19 MED ORDER — PROPOFOL 10 MG/ML IV BOLUS
INTRAVENOUS | Status: DC | PRN
  Administered 2012-11-19: 50 mg via INTRAVENOUS
  Administered 2012-11-19: 100 mg via INTRAVENOUS
  Administered 2012-11-19: 150 mg via INTRAVENOUS

## 2012-11-19 MED ORDER — LIDOCAINE-EPINEPHRINE 1 %-1:100000 IJ SOLN
INTRAMUSCULAR | Status: AC
  Filled 2012-11-19: qty 1

## 2012-11-19 MED ORDER — ACETAMINOPHEN 325 MG PO TABS: 650.0000 mg | ORAL_TABLET | ORAL | Status: DC | PRN

## 2012-11-19 MED ORDER — LACTATED RINGERS IV SOLN
INTRAVENOUS | Status: DC
  Administered 2012-11-19 (×2): 1000 mL via INTRAVENOUS

## 2012-11-19 MED ORDER — ONDANSETRON HCL 4 MG/2ML IJ SOLN: 4.0000 mg | Freq: Four times a day (QID) | INTRAMUSCULAR | Status: DC | PRN

## 2012-11-19 MED ORDER — SODIUM CHLORIDE 0.9 % IJ SOLN: 3.0000 mL | INTRAMUSCULAR | Status: DC | PRN

## 2012-11-19 MED ORDER — LACTATED RINGERS IV SOLN: INTRAVENOUS | Status: DC

## 2012-11-19 MED ORDER — FENTANYL CITRATE 0.05 MG/ML IJ SOLN
INTRAMUSCULAR | Status: DC | PRN
  Administered 2012-11-19: 50 ug via INTRAVENOUS
  Administered 2012-11-19 (×2): 25 ug via INTRAVENOUS

## 2012-11-19 MED ORDER — ENOXAPARIN SODIUM 120 MG/0.8ML ~~LOC~~ SOLN: 110.0000 mg | Freq: Two times a day (BID) | SUBCUTANEOUS | Status: DC

## 2012-11-19 MED ORDER — PROMETHAZINE HCL 25 MG/ML IJ SOLN: 6.2500 mg | INTRAMUSCULAR | Status: DC | PRN

## 2012-11-19 MED ORDER — LIDOCAINE-EPINEPHRINE (PF) 1 %-1:200000 IJ SOLN
INTRAMUSCULAR | Status: DC | PRN
  Administered 2012-11-19: 10 mL

## 2012-11-19 MED ORDER — ACETAMINOPHEN 650 MG RE SUPP
650.0000 mg | RECTAL | Status: DC | PRN
  Filled 2012-11-19: qty 1

## 2012-11-19 MED ORDER — HEPARIN SOD (PORK) LOCK FLUSH 100 UNIT/ML IV SOLN
INTRAVENOUS | Status: DC | PRN
  Administered 2012-11-19: 500 [IU]

## 2012-11-19 MED ORDER — CEFAZOLIN SODIUM-DEXTROSE 2-3 GM-% IV SOLR
2.0000 g | INTRAVENOUS | Status: AC
  Administered 2012-11-19: 2 g via INTRAVENOUS

## 2012-11-19 MED ORDER — BUPIVACAINE HCL (PF) 0.25 % IJ SOLN
INTRAMUSCULAR | Status: AC
  Filled 2012-11-19: qty 30

## 2012-11-19 MED ORDER — ONDANSETRON HCL 4 MG/2ML IJ SOLN
INTRAMUSCULAR | Status: DC | PRN
  Administered 2012-11-19: 4 mg via INTRAVENOUS

## 2012-11-19 MED ORDER — HYDROMORPHONE HCL 2 MG PO TABS: 1.0000 mg | ORAL_TABLET | ORAL | Status: DC | PRN

## 2012-11-19 MED ORDER — CEFAZOLIN SODIUM-DEXTROSE 2-3 GM-% IV SOLR
INTRAVENOUS | Status: AC
  Filled 2012-11-19: qty 50

## 2012-11-19 SURGICAL SUPPLY — 42 items
ADH SKN CLS APL DERMABOND .7 (GAUZE/BANDAGES/DRESSINGS) ×1
BLADE HEX COATED 2.75 (ELECTRODE) ×2 IMPLANT
BLADE SURG 15 STRL LF DISP TIS (BLADE) ×1 IMPLANT
BLADE SURG 15 STRL SS (BLADE) ×2
BLADE SURG SZ11 CARB STEEL (BLADE) ×2 IMPLANT
CANISTER SUCTION 2500CC (MISCELLANEOUS) ×2 IMPLANT
CHLORAPREP W/TINT 26ML (MISCELLANEOUS) ×2 IMPLANT
CLOTH BEACON ORANGE TIMEOUT ST (SAFETY) ×1 IMPLANT
DECANTER SPIKE VIAL GLASS SM (MISCELLANEOUS) ×2 IMPLANT
DERMABOND ADVANCED (GAUZE/BANDAGES/DRESSINGS) ×1
DERMABOND ADVANCED .7 DNX12 (GAUZE/BANDAGES/DRESSINGS) ×1 IMPLANT
DRAPE C-ARM 42X120 X-RAY (DRAPES) ×2 IMPLANT
DRAPE PED LAPAROTOMY (DRAPES) ×2 IMPLANT
DRSG TEGADERM 4X4.75 (GAUZE/BANDAGES/DRESSINGS) ×1 IMPLANT
ELECT REM PT RETURN 9FT ADLT (ELECTROSURGICAL) ×2
ELECTRODE REM PT RTRN 9FT ADLT (ELECTROSURGICAL) ×1 IMPLANT
GAUZE SPONGE 4X4 16PLY XRAY LF (GAUZE/BANDAGES/DRESSINGS) ×3 IMPLANT
GLOVE BIO SURGEON STRL SZ 6 (GLOVE) ×4 IMPLANT
GLOVE BIOGEL PI IND STRL 6.5 (GLOVE) ×1 IMPLANT
GLOVE BIOGEL PI IND STRL 7.0 (GLOVE) ×1 IMPLANT
GLOVE BIOGEL PI INDICATOR 6.5 (GLOVE) ×1
GLOVE BIOGEL PI INDICATOR 7.0 (GLOVE) ×1
GLOVE INDICATOR 6.5 STRL GRN (GLOVE) ×4 IMPLANT
GOWN PREVENTION PLUS XXLARGE (GOWN DISPOSABLE) ×2 IMPLANT
INTRODUCER VALVED 8FR 15CM (SHEATH) ×1 IMPLANT
KIT BASIN OR (CUSTOM PROCEDURE TRAY) ×2 IMPLANT
KIT PORT POWER 8FR ISP CVUE (Catheter) ×1 IMPLANT
KIT POWER CATH 8FR (Catheter) IMPLANT
NDL HYPO 25X1 1.5 SAFETY (NEEDLE) IMPLANT
NEEDLE HYPO 22GX1.5 SAFETY (NEEDLE) ×2 IMPLANT
NEEDLE HYPO 25X1 1.5 SAFETY (NEEDLE) IMPLANT
PACK UNIVERSAL I (CUSTOM PROCEDURE TRAY) ×2 IMPLANT
PENCIL BUTTON HOLSTER BLD 10FT (ELECTRODE) ×2 IMPLANT
SPONGE GAUZE 4X4 12PLY (GAUZE/BANDAGES/DRESSINGS) ×1 IMPLANT
SUT MNCRL AB 4-0 PS2 18 (SUTURE) ×3 IMPLANT
SUT PROLENE 2 0 SH DA (SUTURE) ×4 IMPLANT
SUT VIC AB 3-0 SH 27 (SUTURE) ×2
SUT VIC AB 3-0 SH 27XBRD (SUTURE) ×1 IMPLANT
SYR BULB IRRIGATION 50ML (SYRINGE) IMPLANT
SYR CONTROL 10ML LL (SYRINGE) ×2 IMPLANT
TOWEL OR 17X26 10 PK STRL BLUE (TOWEL DISPOSABLE) ×2 IMPLANT
YANKAUER SUCT BULB TIP 10FT TU (MISCELLANEOUS) IMPLANT

## 2012-11-19 NOTE — Telephone Encounter (Signed)
Spoke with patient over the phone. A PAC was placed today. She will start her 10mg  coumadin tonight and continue her lovenox bid She will be in [place], 10/2 for her Abraxane. We will see her during infusion. I do not anticipate her INR to be therapeutic, but trying to eliminate trips with patient having a busy week. She has standing order INR orders in.

## 2012-11-19 NOTE — Anesthesia Preprocedure Evaluation (Signed)
Anesthesia Evaluation  Patient identified by MRN, date of birth, ID band Patient awake    Reviewed: Allergy & Precautions, H&P , NPO status , Patient's Chart, lab work & pertinent test results  Airway Mallampati: II TM Distance: >3 FB Neck ROM: Full    Dental no notable dental hx.    Pulmonary neg pulmonary ROS,  breath sounds clear to auscultation  Pulmonary exam normal       Cardiovascular hypertension, Pt. on medications Rhythm:Regular Rate:Normal     Neuro/Psych negative neurological ROS  negative psych ROS   GI/Hepatic negative GI ROS, Neg liver ROS,   Endo/Other  diabetesHypothyroidism   Renal/GU negative Renal ROS  negative genitourinary   Musculoskeletal negative musculoskeletal ROS (+)   Abdominal   Peds negative pediatric ROS (+)  Hematology negative hematology ROS (+)   Anesthesia Other Findings   Reproductive/Obstetrics negative OB ROS                           Anesthesia Physical Anesthesia Plan  ASA: III  Anesthesia Plan: General   Post-op Pain Management:    Induction: Intravenous  Airway Management Planned: LMA  Additional Equipment:   Intra-op Plan:   Post-operative Plan:   Informed Consent: I have reviewed the patients History and Physical, chart, labs and discussed the procedure including the risks, benefits and alternatives for the proposed anesthesia with the patient or authorized representative who has indicated his/her understanding and acceptance.   Dental advisory given  Plan Discussed with: CRNA and Surgeon  Anesthesia Plan Comments:         Anesthesia Quick Evaluation

## 2012-11-19 NOTE — Preoperative (Signed)
Beta Blockers   Reason not to administer Beta Blockers:Not Applicable 

## 2012-11-19 NOTE — H&P (View-Only) (Signed)
Chief complaint:  Pancreatic cancer  HISTORY: Patient is a 72 year old female who presented to the hospital with severe abdominal pain around 3 weeks ago. 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 She was placed on anti-coagulation and was eventually discharged in stable condition. Her further workup demonstrated 2 masses in the head of the pancreas. She is subsequently undergone endoscopic ultrasound and cytology is positive for pancreatic cancer. The mass does appear to be remote from the portal vein and SMV, so this is not felt to be tumor thrombus. Upon further review of her symptoms. She had been having some discomfort in her abdomen for around a month or 2. This was not frank pain.  She has not had any diarrhea. She also denies issues with her blood sugars that she is aware of. She has not had weight loss. She has no family history of pancreatic cancer. Her mother did have postmenopausal breast cancer and cervical cancer. She has any bleeding issues since being on the anticoagulation. Since being diagnosed with portal vein thrombosis, she has had some nausea.  Past Medical History  Diagnosis Date  . Trigger finger   . Hyperlipidemia   . Diabetes mellitus   . Chronic venous insufficiency   . Morbid obesity   . Hypertension   . Thyroid disease   . GERD (gastroesophageal reflux disease)   . Hypothyroidism   . Arthritis   . Anemia   . Common bile duct dilatation 10/01/12  . Fatty liver 10/01/12  . Portal vein thrombosis   . Cholelithiasis   . Pancreatic lesion 10/01/12  . Hydroureteronephrosis     right  . Cancer   . Pancreatic mass 2014    Past Surgical History  Procedure Laterality Date  . Lumbar laminectomy    . Breast biopsy    . Eus N/A 10/17/2012    Procedure: UPPER ENDOSCOPIC ULTRASOUND (EUS) LINEAR;  Surgeon: Rachael Fee, MD;  Location: [facility];  Service: Endoscopy;  Laterality: N/A;    Current Outpatient Prescriptions  Medication Sig Dispense  Refill  . acetaminophen (TYLENOL) 325 MG tablet Take 325 mg by mouth every 4 (four) hours as needed for pain. Per bottle      . Biotin 5000 MCG CAPS Take 5,000 mcg by mouth daily.       Marland Kitchen doxazosin (CARDURA) 8 MG tablet Take 8 mg by mouth every morning.      . escitalopram (LEXAPRO) 20 MG tablet Take 20 mg by mouth every morning.      . famotidine (PEPCID) 20 MG tablet Take 20 mg by mouth daily as needed for heartburn.       . fesoterodine (TOVIAZ) 4 MG TB24 tablet Take 4 mg by mouth at bedtime.      . furosemide (LASIX) 40 MG tablet Take 40 mg by mouth daily.      . Insulin Glargine (LANTUS SOLOSTAR) 100 UNIT/ML SOPN Inject 70 Units into the skin every morning.      Marland Kitchen levothyroxine (SYNTHROID, LEVOTHROID) 88 MCG tablet Take 88 mcg by mouth daily before breakfast.      . Liraglutide (VICTOZA) 18 MG/3ML SOPN Inject 1.8 mg into the skin daily.      Marland Kitchen loratadine (CLARITIN) 10 MG tablet Take 10 mg by mouth daily as needed for allergies.       Marland Kitchen LORazepam (ATIVAN) 0.5 MG tablet .5 to 1 tab daily qhs prn insomnia.  30 tablet  0  . losartan (COZAAR) 25 MG  tablet Take 25 mg by mouth every morning.      . metFORMIN (GLUCOPHAGE) 1000 MG tablet Take 1,000 mg by mouth 2 (two) times daily with a meal.      . pantoprazole (PROTONIX) 40 MG tablet Take 40 mg by mouth daily.      . potassium chloride (KLOR-CON) 20 MEQ packet Take 20 mEq by mouth daily.      . pregabalin (LYRICA) 75 MG capsule 1 cap every morning and 1 at bedtime      . pregabalin (LYRICA) 75 MG capsule Take 75-150 mg by mouth 2 (two) times daily. Take one capsule in the am and two capsules at bedtime      . simvastatin (ZOCOR) 20 MG tablet Take 20 mg by mouth at bedtime.      . traMADol (ULTRAM) 50 MG tablet Take 50 mg by mouth every 6 (six) hours as needed for pain.      Marland Kitchen enoxaparin (LOVENOX) 120 MG/0.8ML injection Inject 0.73 mLs (110 mg total) into the skin every 12 (twelve) hours.  14 Syringe  0  . ondansetron (ZOFRAN) 4 MG tablet Take 1  tablet (4 mg total) by mouth every 8 (eight) hours as needed for nausea.  20 tablet  0  . warfarin (COUMADIN) 5 MG tablet Take 2 tablets (10 mg total) by mouth daily.  65 tablet  2  . [DISCONTINUED] fesoterodine (TOVIAZ) 4 MG TB24 Take 1 tablet (4 mg total) by mouth at bedtime.  30 tablet  3   No current facility-administered medications for this visit.     Allergies  Allergen Reactions  . Latex Rash  . Oxycodone-Acetaminophen Nausea Only  . Zithromax [Azithromycin] Other (See Comments)    thrush     Family History  Problem Relation Age of Onset  . Heart disease Father   . Arthritis Father   . Breast cancer Mother   . Lymphoma Sister     #1  . Diabetes Sister     #1  . Diabetes Sister     #2  . Diabetes Brother     #1  . Diabetes Brother     #2  . Diabetes Other     siblings  . Hypertension Other     siblings  . Arthritis Other     siblings     History   Social History  . Marital Status: Married    Spouse Name: N/A    Number of Children: N/A  . Years of Education: N/A   Social History Main Topics  . Smoking status: Never Smoker   . Smokeless tobacco: Never Used  . Alcohol Use: No  . Drug Use: No  . Sexual Activity: None   Other Topics Concern  . None   Social History Narrative   Exercises 3-4 times a week   No caffeine   2 children - twins     REVIEW OF SYSTEMS - PERTINENT POSITIVES ONLY: 12 point review of systems negative other than HPI and PMH except for difficulty sleeping, cough, nausea, reflux, urinary incontinence, sinus issues, joint pain, blurry vision.    EXAM: Filed Vitals:   10/22/12 1433  BP: 132/84  Pulse: 84  Temp: 97.6 F (36.4 C)  Resp: 16   Filed Weights   10/22/12 1433  Weight: 222 lb 9.6 oz (100.971 kg)     Gen:  No acute distress.  Well nourished and well groomed.   Neurological: Alert and oriented to person, place, and time. Coordination  normal.  Head: Normocephalic and atraumatic.  Eyes: Conjunctivae are  normal. Pupils are equal, round, and reactive to light. No scleral icterus.  Neck: Normal range of motion. Neck supple. No tracheal deviation or thyromegaly present.  Cardiovascular: Normal rate, regular rhythm, normal heart sounds and intact distal pulses.  Exam reveals no gallop and no friction rub.  No murmur heard. Respiratory: Effort normal.  No respiratory distress. No chest wall tenderness. Breath sounds normal.  No wheezes, rales or rhonchi.  GI: Soft. Bowel sounds are normal. The abdomen is soft and nontender.  There is no rebound and no guarding.  Musculoskeletal: Normal range of motion. Extremities are nontender.  Lymphadenopathy: No cervical, preauricular, postauricular or axillary adenopathy is present Skin: Skin is warm and dry. No rash noted. No diaphoresis. No erythema. No pallor. No clubbing, cyanosis, or edema.   Psychiatric: Normal mood and affect. Behavior is normal. Judgment and thought content normal.    LABORATORY RESULTS: Available labs are reviewed  Labs reviewed in epic.  CA 19-9 527. Albumin 3.3. Pathology Diagnosis FINE NEEDLE ASPIRATION, ENDOSCOPIC, PANCREAS HEAD: ABUNDANT NECROSIS AND MALIGNANT GLANDULAR CELLS, CONSISTENT WITH ADENOCARCINOMA.  RADIOLOGY RESULTS: See E-Chart or I-Site for most recent results.  Images and reports are reviewed. CT angio  IMPRESSION:  Stable portal vein thrombosis as described. There are associated  inflammatory changes.  Stable 1.2 cm hypodensity in the body of the pancreas. 25-month  follow-up is recommended as cystic neoplasm is not entirely  excluded.  Small pleural effusions have enlarged. Small amount of free fluid  is now more noticeable about the liver.  Stable cholelithiasis.  Stable right hydronephrosis and slightly delayed contrast  excretion. These are secondary findings of partial right ureteral  obstruction.  No significant obstruction in the arterial vasculature.  There is a second 1.5 cm lesion in the head  of the pancreas that is  indeterminate. MRI the pancreas is recommended to further  characterize. Alternatively, trans duodenal ultrasound biopsy is  also a consideration.    ASSESSMENT AND PLAN: Pancreatic cancer Patient does appear to have small mass that is localized to the pancreas. However, she has portal vein thrombosis. This is a contraindication to pancreaticoduodenectomy. I will present her at tumor board to see if there are other options to pursue. She is going to meet with Dr. Truett Perna of oncology to discuss chemotherapy and or chemoradiation.  We will likely repeat her imaging studies. There is a small possibility that she will have recanalized her portal vein. Unfortunately, she will likely have collateralization at that point. Collateralization can lead to life-threatening blood loss.  I discussed the patient with Dr. Truett Perna. He advised that she stop her Coumadin and stay on Lovenox. He is not sure if he would offer her 5-FU with radiation or gemcitabine and Abraxane.  He will advise me if he needs a Port-A-Cath placed for the patient's treatment.  I will determine followup for the patient with myself after tumor Board.  45 minutes were spent in counseling and examination with the patient and family, with greater than 50% of the time being spent in counseling.  Patient was advised that she could continue to work as desired and tolerated unless instructed otherwise after further studies.   Chest CT ordered to rule out metastatic disease.       Maudry Diego MD Surgical Oncology, General and Endocrine Surgery [facility]      Visit Diagnoses: 1. Pancreatic cancer     Primary Care Physician: Jovita Gamma  Dayton Martes, MD

## 2012-11-19 NOTE — Transfer of Care (Signed)
Immediate Anesthesia Transfer of Care Note  Patient: Theresa Summers  Procedure(s) Performed: Procedure(s): INSERTION PORT-A-CATH (Left)  Patient Location: PACU  Anesthesia Type:General  Level of Consciousness: awake, alert , oriented, patient cooperative and responds to stimulation  Airway & Oxygen Therapy: Patient Spontanous Breathing and Patient connected to face mask oxygen  Post-op Assessment: Report given to PACU RN, Post -op Vital signs reviewed and stable and Patient moving all extremities X 4  Post vital signs: stable  Complications: No apparent anesthesia complications

## 2012-11-19 NOTE — Telephone Encounter (Signed)
Talked to pt,  she is aware of all appts for October 2014

## 2012-11-19 NOTE — Interval H&P Note (Signed)
History and Physical Interval Note:  11/19/2012 10:24 AM  Theresa Summers  has presented today for surgery, with the diagnosis of pancreatic cancer  The various methods of treatment have been discussed with the patient and family. After consideration of risks, benefits and other options for treatment, the patient has consented to  Procedure(s): INSERTION PORT-A-CATH (N/A) as a surgical intervention .  The patient's history has been reviewed, patient examined, no change in status, stable for surgery.  I have reviewed the patient's chart and labs.  Questions were answered to the patient's satisfaction.     [name]

## 2012-11-19 NOTE — Anesthesia Postprocedure Evaluation (Signed)
  Anesthesia Post-op Note  Patient: Theresa Summers  Procedure(s) Performed: Procedure(s) (LRB): INSERTION PORT-A-CATH (Left)  Patient Location: PACU  Anesthesia Type: General  Level of Consciousness: awake and alert   Airway and Oxygen Therapy: Patient Spontanous Breathing  Post-op Pain: mild  Post-op Assessment: Post-op Vital signs reviewed, Patient's Cardiovascular Status Stable, Respiratory Function Stable, Patent Airway and No signs of Nausea or vomiting  Last Vitals:  Filed Vitals:   11/19/12 1230  BP: 172/65  Pulse: 83  Temp:   Resp: 25    Post-op Vital Signs: stable   Complications: No apparent anesthesia complications

## 2012-11-19 NOTE — Op Note (Signed)
PREOPERATIVE DIAGNOSIS:  Pancreatic cancer with portal vein thrombosis     POSTOPERATIVE DIAGNOSIS:  Same     PROCEDURE: Left subclavian port placement, Bard ClearVue  Power Port, MRI safe, 8-French.      SURGEON:  Almond Lint, MD      ANESTHESIA:  General   FINDINGS:  Good venous return, easy flush, and tip of the catheter and   SVC 26 cm.      SPECIMEN:  None.      ESTIMATED BLOOD LOSS:  Minimal.      COMPLICATIONS:  None known.      PROCEDURE:  Pt was identified in the holding area and taken to   the operating room, where patient was placed supine on the operating room   table.  General anesthesia was induced.  Patient's arms were tucked and the upper   chest and neck were prepped and draped in sterile fashion.  Time-out was   performed according to the surgical safety check list.  When all was   correct, we continued.   Local anesthetic was administered over this   area at the angle of the clavicle.  The vein was accessed with 1 pass of the needle. There was good venous return and the wire passed easily with no ectopy.   Fluoroscopy was used to confirm that the wire was in the vena cava.      The patient was placed back level and the area for the pocket was anethetized   with local anesthetic.  A 3-cm transverse incision was made with a #15   blade.  Cautery was used to divide the subcutaneous tissues down to the   pectoralis muscle.  An [employer] retractor was used to elevate the skin   while a pocket was created on top of the pectoralis fascia.  The port   was placed into the pocket to confirm that it was of adequate size.  The   catheter was preattached to the port.  The port was then secured to the   pectoralis fascia with four 2-0 Prolene sutures.  These were clamped and   not tied down yet.    The catheter was tunneled through to the wire exit   site.  The catheter was placed along the wire to determine what length it should be to be in the SVC.  The catheter was  cut at 22.5 cm.  The tunneler sheath and dilator were passed over the wire and the dilator and wire were removed.  The catheter was advanced through the tunneler sheath and the tunneler sheath was pulled away.  Unfortunately, the catheter passed into the arm.  The wire was passed back through the catheter.  Manipulation of the wire and catheter had to performed multiple times.  The vein had to be restuck twice due to difficulty getting the catheter in the right position.  Eventually, a new catheter had to be used and passed all the way into the right ventricle and pulled back slowly.  Care was taken to keep the catheter in the tunneler sheath as this occurred. This was advanced and the tunneler sheath was removed.  There was good venous   return and easy flush of the catheter.  The Prolene sutures were tied   down to the pectoral fascia.  The skin was reapproximated using 3-0   Vicryl interrupted deep dermal sutures.    Fluoroscopy was used to re-confirm good position of the catheter.  The skin   was then  closed using 4-0 Monocryl in a subcuticular fashion.  The port was flushed with concentrated heparin flush as well.  The wounds were then cleaned, dried, and dressed with Dermabond.  The patient was awakened from anesthesia and taken to the PACU in stable condition.  Needle, sponge, and instrument counts were correct.               Almond Lint, MD

## 2012-11-19 NOTE — Telephone Encounter (Signed)
No additional note

## 2012-11-20 ENCOUNTER — Telehealth (INDEPENDENT_AMBULATORY_CARE_PROVIDER_SITE_OTHER): Payer: Self-pay | Admitting: *Deleted

## 2012-11-20 ENCOUNTER — Other Ambulatory Visit: Payer: Self-pay | Admitting: *Deleted

## 2012-11-20 ENCOUNTER — Encounter: Payer: Self-pay | Admitting: *Deleted

## 2012-11-20 ENCOUNTER — Encounter (HOSPITAL_COMMUNITY): Payer: Self-pay | Admitting: General Surgery

## 2012-11-20 ENCOUNTER — Other Ambulatory Visit: Payer: Self-pay | Admitting: Oncology

## 2012-11-20 ENCOUNTER — Other Ambulatory Visit: Payer: 59

## 2012-11-20 DIAGNOSIS — C259 Malignant neoplasm of pancreas, unspecified: Secondary | ICD-10-CM

## 2012-11-20 MED ORDER — LIDOCAINE-PRILOCAINE 2.5-2.5 % EX CREA: TOPICAL_CREAM | CUTANEOUS | Status: DC | PRN

## 2012-11-20 MED ORDER — LIDOCAINE-PRILOCAINE 2.5-2.5 % EX CREA: 1.0000 "application " | TOPICAL_CREAM | CUTANEOUS | Status: DC | PRN

## 2012-11-20 MED ORDER — PROCHLORPERAZINE MALEATE 10 MG PO TABS: 10.0000 mg | ORAL_TABLET | Freq: Four times a day (QID) | ORAL | Status: DC | PRN

## 2012-11-21 ENCOUNTER — Ambulatory Visit (HOSPITAL_BASED_OUTPATIENT_CLINIC_OR_DEPARTMENT_OTHER): Payer: 59 | Admitting: Pharmacist

## 2012-11-21 ENCOUNTER — Other Ambulatory Visit: Payer: Self-pay | Admitting: *Deleted

## 2012-11-21 ENCOUNTER — Other Ambulatory Visit: Payer: Self-pay | Admitting: Oncology

## 2012-11-21 ENCOUNTER — Ambulatory Visit (HOSPITAL_BASED_OUTPATIENT_CLINIC_OR_DEPARTMENT_OTHER): Payer: 59

## 2012-11-21 VITALS — BP 144/55 | HR 87 | Temp 98.1°F

## 2012-11-21 DIAGNOSIS — C259 Malignant neoplasm of pancreas, unspecified: Secondary | ICD-10-CM

## 2012-11-21 DIAGNOSIS — Z5111 Encounter for antineoplastic chemotherapy: Secondary | ICD-10-CM

## 2012-11-21 DIAGNOSIS — I81 Portal vein thrombosis: Secondary | ICD-10-CM

## 2012-11-21 MED ORDER — DEXAMETHASONE SODIUM PHOSPHATE 10 MG/ML IJ SOLN
10.0000 mg | Freq: Once | INTRAMUSCULAR | Status: AC
  Administered 2012-11-21: 10 mg via INTRAVENOUS

## 2012-11-21 MED ORDER — DEXAMETHASONE SODIUM PHOSPHATE 10 MG/ML IJ SOLN
INTRAMUSCULAR | Status: AC
  Filled 2012-11-21: qty 1

## 2012-11-21 MED ORDER — SODIUM CHLORIDE 0.9 % IJ SOLN
10.0000 mL | INTRAMUSCULAR | Status: DC | PRN
  Administered 2012-11-21: 10 mL
  Filled 2012-11-21: qty 10

## 2012-11-21 MED ORDER — ONDANSETRON 8 MG/50ML IVPB (CHCC)
8.0000 mg | Freq: Once | INTRAVENOUS | Status: AC
  Administered 2012-11-21: 8 mg via INTRAVENOUS

## 2012-11-21 MED ORDER — ONDANSETRON 8 MG/NS 50 ML IVPB
INTRAVENOUS | Status: AC
  Filled 2012-11-21: qty 8

## 2012-11-21 MED ORDER — SODIUM CHLORIDE 0.9 % IV SOLN
Freq: Once | INTRAVENOUS | Status: AC
  Administered 2012-11-21: 09:00:00 via INTRAVENOUS

## 2012-11-21 MED ORDER — HEPARIN SOD (PORK) LOCK FLUSH 100 UNIT/ML IV SOLN
500.0000 [IU] | Freq: Once | INTRAVENOUS | Status: AC | PRN
  Administered 2012-11-21: 500 [IU]
  Filled 2012-11-21: qty 5

## 2012-11-21 MED ORDER — SODIUM CHLORIDE 0.9 % IV SOLN
800.0000 mg/m2 | Freq: Once | INTRAVENOUS | Status: AC
  Administered 2012-11-21: 1748 mg via INTRAVENOUS
  Filled 2012-11-21: qty 45.97

## 2012-11-21 MED ORDER — PACLITAXEL PROTEIN-BOUND CHEMO INJECTION 100 MG
100.0000 mg/m2 | Freq: Once | INTRAVENOUS | Status: AC
  Administered 2012-11-21: 225 mg via INTRAVENOUS
  Filled 2012-11-21: qty 45

## 2012-11-21 NOTE — Progress Notes (Signed)
Pt seen in infusion area today with her husband as she received first time gemzar/abraxane She had port placed on Tuesday and began coumadin last night INR from 9/30 was 1.03 No INR needed today since coumadin just taken last night Will continue coumadin and lovenox until INR is > 2 Continue coumadin 10 mg daily Continue lovenox injections twice daily Pt would like to have INR checked at her primary care office next week for convenience for her Recheck INR on Monday at Primary care office in [facility]) [phone] Called and spoke with desk RN Carolynn who confirmed time and lab appointment for Monday 11/25/12 Results will be faxed to our clinic at [phone] And a pharmacist from our clinic will call to discuss

## 2012-11-21 NOTE — Patient Instructions (Addendum)
No INR needed today since coumadin just taken last night Will continue coumadin and lovenox until INR is > 2 Continue coumadin 10 mg daily Continue lovenox injections twice daily Recheck INR on Monday and Primary care office in [facility]) [phone] Results will be faxed to [phone] And a pharmacist from our clinic will call you to discuss

## 2012-11-21 NOTE — Progress Notes (Signed)
Pt in for chemo, she requests notes to be sent to Dr. Sharl Ma. Has endocrinology appt 10/3 and he is unaware of her cancer diagnosis. Note faxed via computer.

## 2012-11-21 NOTE — Patient Instructions (Addendum)
[facility] Discharge Instructions for Patients Receiving Chemotherapy  Today you received the following chemotherapy agents Abraxane/Gemzar  To help prevent nausea and vomiting after your treatment, we encourage you to take your nausea medication as prescribed.   If you develop nausea and vomiting that is not controlled by your nausea medication, call the clinic.   BELOW ARE SYMPTOMS THAT SHOULD BE REPORTED IMMEDIATELY:  *FEVER GREATER THAN 100.5 F  *CHILLS WITH OR WITHOUT FEVER  NAUSEA AND VOMITING THAT IS NOT CONTROLLED WITH YOUR NAUSEA MEDICATION  *UNUSUAL SHORTNESS OF BREATH  *UNUSUAL BRUISING OR BLEEDING  TENDERNESS IN MOUTH AND THROAT WITH OR WITHOUT PRESENCE OF ULCERS  *URINARY PROBLEMS  *BOWEL PROBLEMS  UNUSUAL RASH Items with * indicate a potential emergency and should be followed up as soon as possible.  Feel free to call the clinic you have any questions or concerns. The clinic phone number is [phone].    

## 2012-11-22 ENCOUNTER — Telehealth: Payer: Self-pay

## 2012-11-22 NOTE — Telephone Encounter (Signed)
Left a message stating that this nurse was calling to follow up on first treatment received yesterday at [facility].  Please call back to let us know if how your are doing.

## 2012-11-22 NOTE — Telephone Encounter (Signed)
Message copied by Lorine Bears on Fri Nov 22, 2012  1:24 PM ------      Message from: Jolaine Artist      Created: Thu Nov 21, 2012 10:52 AM      Regarding: chemo follow-up       1st Gemzar/Abraxane  Dr. Truett Perna [phone] ------

## 2012-11-25 ENCOUNTER — Ambulatory Visit (INDEPENDENT_AMBULATORY_CARE_PROVIDER_SITE_OTHER): Payer: 59 | Admitting: Family Medicine

## 2012-11-25 ENCOUNTER — Ambulatory Visit: Payer: 59 | Admitting: Pharmacist

## 2012-11-25 ENCOUNTER — Telehealth: Payer: Self-pay | Admitting: Family Medicine

## 2012-11-25 DIAGNOSIS — I81 Portal vein thrombosis: Secondary | ICD-10-CM

## 2012-11-25 DIAGNOSIS — Z7901 Long term (current) use of anticoagulants: Secondary | ICD-10-CM

## 2012-11-25 MED ORDER — WARFARIN SODIUM 10 MG PO TABS: ORAL_TABLET | ORAL | Status: DC

## 2012-11-25 NOTE — Progress Notes (Signed)
INR quickly therapeutic = 2.5 today (drawn at [facility]).  Pts INR = 1.03 just 6 days ago. I left voicemail for pt that we will recheck her INR here on 11/28/12. Ok'd by Dr. Truett Perna to stop Lovenox. She'll stay on Coumadin 10 mg/day for now. NO CHARGE- phone encounter. Ebony Hail, Pharm.D., CPP 11/25/2012@4 :15 PM

## 2012-11-25 NOTE — Telephone Encounter (Signed)
Pt INR results 2.5 today.  Ok to d/c Lovenox and continue Coumadin 10mg  daily?

## 2012-11-28 ENCOUNTER — Ambulatory Visit (HOSPITAL_BASED_OUTPATIENT_CLINIC_OR_DEPARTMENT_OTHER): Payer: 59

## 2012-11-28 ENCOUNTER — Ambulatory Visit (HOSPITAL_BASED_OUTPATIENT_CLINIC_OR_DEPARTMENT_OTHER): Payer: 59 | Admitting: Pharmacist

## 2012-11-28 ENCOUNTER — Other Ambulatory Visit (HOSPITAL_BASED_OUTPATIENT_CLINIC_OR_DEPARTMENT_OTHER): Payer: 59 | Admitting: Lab

## 2012-11-28 VITALS — BP 140/80 | HR 85 | Temp 97.7°F

## 2012-11-28 DIAGNOSIS — I81 Portal vein thrombosis: Secondary | ICD-10-CM

## 2012-11-28 DIAGNOSIS — Z5111 Encounter for antineoplastic chemotherapy: Secondary | ICD-10-CM

## 2012-11-28 DIAGNOSIS — C259 Malignant neoplasm of pancreas, unspecified: Secondary | ICD-10-CM

## 2012-11-28 DIAGNOSIS — N1330 Unspecified hydronephrosis: Secondary | ICD-10-CM

## 2012-11-28 LAB — CBC WITH DIFFERENTIAL/PLATELET
Basophils Absolute: 0 10*3/uL (ref 0.0–0.1)
EOS%: 0.9 % (ref 0.0–7.0)
Eosinophils Absolute: 0 10*3/uL (ref 0.0–0.5)
HGB: 10.2 g/dL — ABNORMAL LOW (ref 11.6–15.9)
MCV: 81.6 fL (ref 79.5–101.0)
MONO#: 0.4 10*3/uL (ref 0.1–0.9)
NEUT#: 2.2 10*3/uL (ref 1.5–6.5)
RBC: 3.81 10*6/uL (ref 3.70–5.45)
RDW: 17 % — ABNORMAL HIGH (ref 11.2–14.5)
WBC: 3.7 10*3/uL — ABNORMAL LOW (ref 3.9–10.3)

## 2012-11-28 LAB — PROTIME-INR
INR: 1.9 — ABNORMAL LOW (ref 2.00–3.50)
Protime: 22.8 Seconds — ABNORMAL HIGH (ref 10.6–13.4)

## 2012-11-28 LAB — CANCER ANTIGEN 19-9: CA 19-9: 12907.7 U/mL — ABNORMAL HIGH (ref <35.0)

## 2012-11-28 MED ORDER — DEXAMETHASONE SODIUM PHOSPHATE 10 MG/ML IJ SOLN
10.0000 mg | Freq: Once | INTRAMUSCULAR | Status: AC
  Administered 2012-11-28: 10 mg via INTRAVENOUS

## 2012-11-28 MED ORDER — SODIUM CHLORIDE 0.9 % IV SOLN
800.0000 mg/m2 | Freq: Once | INTRAVENOUS | Status: AC
  Administered 2012-11-28: 1748 mg via INTRAVENOUS
  Filled 2012-11-28: qty 45.97

## 2012-11-28 MED ORDER — ONDANSETRON 8 MG/NS 50 ML IVPB
INTRAVENOUS | Status: AC
  Filled 2012-11-28: qty 8

## 2012-11-28 MED ORDER — SODIUM CHLORIDE 0.9 % IV SOLN
Freq: Once | INTRAVENOUS | Status: AC
  Administered 2012-11-28: 11:00:00 via INTRAVENOUS

## 2012-11-28 MED ORDER — HEPARIN SOD (PORK) LOCK FLUSH 100 UNIT/ML IV SOLN
500.0000 [IU] | Freq: Once | INTRAVENOUS | Status: AC | PRN
  Administered 2012-11-28: 500 [IU]
  Filled 2012-11-28: qty 5

## 2012-11-28 MED ORDER — ONDANSETRON 8 MG/50ML IVPB (CHCC)
8.0000 mg | Freq: Once | INTRAVENOUS | Status: AC
  Administered 2012-11-28: 8 mg via INTRAVENOUS

## 2012-11-28 MED ORDER — DEXAMETHASONE SODIUM PHOSPHATE 10 MG/ML IJ SOLN
INTRAMUSCULAR | Status: AC
  Filled 2012-11-28: qty 1

## 2012-11-28 MED ORDER — SODIUM CHLORIDE 0.9 % IJ SOLN
10.0000 mL | INTRAMUSCULAR | Status: DC | PRN
  Administered 2012-11-28: 10 mL
  Filled 2012-11-28: qty 10

## 2012-11-28 MED ORDER — PACLITAXEL PROTEIN-BOUND CHEMO INJECTION 100 MG
100.0000 mg/m2 | Freq: Once | INTRAVENOUS | Status: AC
  Administered 2012-11-28: 225 mg via INTRAVENOUS
  Filled 2012-11-28: qty 45

## 2012-11-28 NOTE — Progress Notes (Signed)
Pt seen during infusion today.  INR=1.9 on 10 mg coumadin daily Will continue 10 mg daily. Pt states she has been having trouble sleeping.  She is currently taking xanax 0.5 mg and it works for about 2 hrs, then she is up. I reviewed her med list.  She stated she is not taking the trazadone for sleep, but it has worked in the past.  I told her it would be fine to try it again. I also mentioned temazepam or ambien.  She was not a interested in [place].  She plans to try Trazadone in place of xanax and discuss options with MD next week.

## 2012-11-28 NOTE — Patient Instructions (Signed)
[facility] Discharge Instructions for Patients Receiving Chemotherapy  Today you received the following chemotherapy agents Gemzar/Abraxane.  To help prevent nausea and vomiting after your treatment, we encourage you to take your nausea medication as prescribed.   If you develop nausea and vomiting that is not controlled by your nausea medication, call the clinic.   BELOW ARE SYMPTOMS THAT SHOULD BE REPORTED IMMEDIATELY:  *FEVER GREATER THAN 100.5 F  *CHILLS WITH OR WITHOUT FEVER  NAUSEA AND VOMITING THAT IS NOT CONTROLLED WITH YOUR NAUSEA MEDICATION  *UNUSUAL SHORTNESS OF BREATH  *UNUSUAL BRUISING OR BLEEDING  TENDERNESS IN MOUTH AND THROAT WITH OR WITHOUT PRESENCE OF ULCERS  *URINARY PROBLEMS  *BOWEL PROBLEMS  UNUSUAL RASH Items with * indicate a potential emergency and should be followed up as soon as possible.  Feel free to call the clinic you have any questions or concerns. The clinic phone number is [phone].    

## 2012-11-28 NOTE — Patient Instructions (Signed)
Continue coumadin 10 mg daily.  Recheck INR on 12/05/12- will see pt in infusion.

## 2012-11-29 ENCOUNTER — Telehealth: Payer: Self-pay | Admitting: *Deleted

## 2012-11-29 MED ORDER — TEMAZEPAM 15 MG PO CAPS: 15.0000 mg | ORAL_CAPSULE | Freq: Every evening | ORAL | Status: DC | PRN

## 2012-11-29 NOTE — Telephone Encounter (Addendum)
Call from pt reporting she has been unable to sleep. Took at total of 100 mg Trazodone last night which was ineffective. Requesting to try Restoril, pharmacist mentioned yesterday. Returned call. Pt reports she has been taking Trazodone for years and has always worked up until her cancer diagnosis. Xanax has not helped. Pt reports she has tried Benadryl in the past and it has not helped. Reviewed with Misty Stanley, NP: Order received for Restoril 15 mg QHS PRN. Do not take Xanax or Trazodone while taking this med. Pt voiced understanding of instructions. Rx called to pharmacy.

## 2012-12-01 ENCOUNTER — Other Ambulatory Visit: Payer: Self-pay | Admitting: Oncology

## 2012-12-05 ENCOUNTER — Ambulatory Visit: Payer: 59 | Admitting: Nutrition

## 2012-12-05 ENCOUNTER — Telehealth: Payer: Self-pay | Admitting: Oncology

## 2012-12-05 ENCOUNTER — Ambulatory Visit (HOSPITAL_BASED_OUTPATIENT_CLINIC_OR_DEPARTMENT_OTHER): Payer: 59 | Admitting: Nurse Practitioner

## 2012-12-05 ENCOUNTER — Ambulatory Visit (HOSPITAL_BASED_OUTPATIENT_CLINIC_OR_DEPARTMENT_OTHER): Payer: 59 | Admitting: Pharmacist

## 2012-12-05 ENCOUNTER — Ambulatory Visit (HOSPITAL_BASED_OUTPATIENT_CLINIC_OR_DEPARTMENT_OTHER): Payer: 59

## 2012-12-05 ENCOUNTER — Other Ambulatory Visit (HOSPITAL_BASED_OUTPATIENT_CLINIC_OR_DEPARTMENT_OTHER): Payer: 59 | Admitting: Lab

## 2012-12-05 VITALS — BP 176/53 | HR 93 | Temp 98.1°F | Resp 20 | Ht 66.0 in | Wt 219.0 lb

## 2012-12-05 DIAGNOSIS — N1330 Unspecified hydronephrosis: Secondary | ICD-10-CM

## 2012-12-05 DIAGNOSIS — C250 Malignant neoplasm of head of pancreas: Secondary | ICD-10-CM

## 2012-12-05 DIAGNOSIS — C259 Malignant neoplasm of pancreas, unspecified: Secondary | ICD-10-CM

## 2012-12-05 DIAGNOSIS — G893 Neoplasm related pain (acute) (chronic): Secondary | ICD-10-CM

## 2012-12-05 DIAGNOSIS — D649 Anemia, unspecified: Secondary | ICD-10-CM

## 2012-12-05 DIAGNOSIS — I81 Portal vein thrombosis: Secondary | ICD-10-CM

## 2012-12-05 DIAGNOSIS — Z5111 Encounter for antineoplastic chemotherapy: Secondary | ICD-10-CM

## 2012-12-05 DIAGNOSIS — E039 Hypothyroidism, unspecified: Secondary | ICD-10-CM

## 2012-12-05 LAB — CBC WITH DIFFERENTIAL/PLATELET
EOS%: 2.1 % (ref 0.0–7.0)
HCT: 29.6 % — ABNORMAL LOW (ref 34.8–46.6)
MCH: 26.6 pg (ref 25.1–34.0)
MCHC: 32.8 g/dL (ref 31.5–36.0)
MCV: 81.3 fL (ref 79.5–101.0)
MONO#: 0.3 10*3/uL (ref 0.1–0.9)
MONO%: 8.9 % (ref 0.0–14.0)
RBC: 3.64 10*6/uL — ABNORMAL LOW (ref 3.70–5.45)
RDW: 16.7 % — ABNORMAL HIGH (ref 11.2–14.5)
nRBC: 0 % (ref 0–0)

## 2012-12-05 LAB — PROTIME-INR: INR: 3.3 (ref 2.00–3.50)

## 2012-12-05 MED ORDER — ONDANSETRON 8 MG/50ML IVPB (CHCC)
8.0000 mg | Freq: Once | INTRAVENOUS | Status: AC
  Administered 2012-12-05: 8 mg via INTRAVENOUS

## 2012-12-05 MED ORDER — HEPARIN SOD (PORK) LOCK FLUSH 100 UNIT/ML IV SOLN
500.0000 [IU] | Freq: Once | INTRAVENOUS | Status: AC | PRN
  Administered 2012-12-05: 500 [IU]
  Filled 2012-12-05: qty 5

## 2012-12-05 MED ORDER — ONDANSETRON 8 MG/NS 50 ML IVPB
INTRAVENOUS | Status: AC
  Filled 2012-12-05: qty 8

## 2012-12-05 MED ORDER — SODIUM CHLORIDE 0.9 % IJ SOLN
10.0000 mL | INTRAMUSCULAR | Status: DC | PRN
  Administered 2012-12-05: 10 mL
  Filled 2012-12-05: qty 10

## 2012-12-05 MED ORDER — SODIUM CHLORIDE 0.9 % IV SOLN
800.0000 mg/m2 | Freq: Once | INTRAVENOUS | Status: AC
  Administered 2012-12-05: 1748 mg via INTRAVENOUS
  Filled 2012-12-05: qty 45.97

## 2012-12-05 MED ORDER — DEXAMETHASONE SODIUM PHOSPHATE 10 MG/ML IJ SOLN
INTRAMUSCULAR | Status: AC
  Filled 2012-12-05: qty 1

## 2012-12-05 MED ORDER — SODIUM CHLORIDE 0.9 % IV SOLN
Freq: Once | INTRAVENOUS | Status: AC
  Administered 2012-12-05: 12:00:00 via INTRAVENOUS

## 2012-12-05 MED ORDER — PACLITAXEL PROTEIN-BOUND CHEMO INJECTION 100 MG
100.0000 mg/m2 | Freq: Once | INTRAVENOUS | Status: AC
  Administered 2012-12-05: 225 mg via INTRAVENOUS
  Filled 2012-12-05: qty 45

## 2012-12-05 MED ORDER — DEXAMETHASONE SODIUM PHOSPHATE 10 MG/ML IJ SOLN
10.0000 mg | Freq: Once | INTRAMUSCULAR | Status: AC
  Administered 2012-12-05: 10 mg via INTRAVENOUS

## 2012-12-05 NOTE — Patient Instructions (Signed)
[facility] Discharge Instructions for Patients Receiving Chemotherapy  Today you received the following chemotherapy agents Abraxane and Gemzar.  To help prevent nausea and vomiting after your treatment, we encourage you to take your nausea medication.   If you develop nausea and vomiting that is not controlled by your nausea medication, call the clinic.   BELOW ARE SYMPTOMS THAT SHOULD BE REPORTED IMMEDIATELY:  *FEVER GREATER THAN 100.5 F  *CHILLS WITH OR WITHOUT FEVER  NAUSEA AND VOMITING THAT IS NOT CONTROLLED WITH YOUR NAUSEA MEDICATION  *UNUSUAL SHORTNESS OF BREATH  *UNUSUAL BRUISING OR BLEEDING  TENDERNESS IN MOUTH AND THROAT WITH OR WITHOUT PRESENCE OF ULCERS  *URINARY PROBLEMS  *BOWEL PROBLEMS  UNUSUAL RASH Items with * indicate a potential emergency and should be followed up as soon as possible.  Feel free to call the clinic you have any questions or concerns. The clinic phone number is [phone].    

## 2012-12-05 NOTE — Telephone Encounter (Signed)
gv pt appt schedule for October and November. Per BS ok to move 11/6 lab tx to 11/7 per pt req.

## 2012-12-05 NOTE — Progress Notes (Signed)
INR slightly elevated today.  This may be due to the steroids from chemo.  Theresa Summers did not report and bleeding or bruising.  She has been taking coumadin 10mg  for 2 weeks.  Will continue Coumadin 10mg  daily and check PT/INR next week to ensure INR is not increasing.

## 2012-12-05 NOTE — Progress Notes (Signed)
Patient states she is eating smaller amounts at mealtimes.  She does not always snack.  Weight has decreased to 219 pounds October 16 down from 241 pounds in April 2014.  She is also down 6 pounds over this past month.  She reports that her Victoza has been discontinued.  She has better Glucose control.  She has had days where her stools have been loose.  Patient has many questions regarding oral intake.  Nutrition diagnosis: Unintended weight loss continues.  Intervention: Patient educated to be sure to consume high protein foods with meals and snacks 6 times daily.  Patient was educated on foods that might cause distress.  She was also educated on foods to include to improve diarrhea.  Questions were answered.  Teach back method used.  Monitoring, evaluation, goals: Patient will increase oral intake to minimize weight loss.  She will use oral nutrition supplements as needed.  Next visit: Friday, November 7 , during chemotherapy.

## 2012-12-05 NOTE — Progress Notes (Signed)
OFFICE PROGRESS NOTE  Interval history:  Theresa Summers is a 72 year old woman with recently diagnosed to pancreas cancer. She is not a candidate for a Whipple procedure. She began treatment with gemcitabine/Abraxane weekly x3 followed by a one-week break on 11/21/2012. She is seen today for scheduled followup.  She continues to have abdominal pain. She takes tramadol and Tylenol as needed. She has intermittent nausea. No vomiting. She denies any mouth sores. She had loose stools after the chemotherapy for one to 2 days. She took some Imodium. She has baseline neuropathy symptoms in the feet. No change following the chemotherapy. No skin rash. She denies shortness of breath. No chest pain. No leg swelling. She denies fever and cough.   Objective: Blood pressure 176/53, pulse 93, temperature 98.1 F (36.7 C), temperature source Oral, resp. rate 20, height 5\' 6"  (1.676 m), weight 219 lb (99.338 kg).  Oropharynx is without thrush or ulceration. Lungs are clear. No wheezes or rales. Regular cardiac rhythm. Port-A-Cath site is without erythema. Abdomen is soft. No hepatomegaly. No mass. Legs without edema. Resolving ecchymoses over the anterior abdominal wall.  Lab Results: Lab Results  Component Value Date   WBC 3.4* 12/05/2012   HGB 9.7* 12/05/2012   HCT 29.6* 12/05/2012   MCV 81.3 12/05/2012   PLT 121* 12/05/2012    Chemistry:    Chemistry      Component Value Date/Time   NA 137 11/19/2012 0850   K 3.8 11/19/2012 0850   CL 103 11/19/2012 0850   CO2 23 11/19/2012 0850   BUN 12 11/19/2012 0850   CREATININE 0.57 11/19/2012 0850      Component Value Date/Time   CALCIUM 9.4 11/19/2012 0850   ALKPHOS 62 11/07/2012 0653   AST 22 11/07/2012 0653   ALT 19 11/07/2012 0653   BILITOT 0.3 11/07/2012 0653       Studies/Results: Ir Angiogram Selective Each Additional Vessel  11/07/2012   *RADIOLOGY REPORT*  IR TRANSHEPATIC PORTOGRAM; IR VENOGRAM SELECTIVE EACH ADDITIONAL VESSEL  Date: 11/07/2012   Clinical History: 72 year old female with pancreatic cancer and 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 She presents to interventional radiology for attempted percutaneous transhepatic thrombolysis/thrombectomy procedure.  Procedures Performed: 5.  Gelfoam plug embolization of the transhepatic tract 1. Ultrasound-guided puncture of the right portal vein branch 2.  The transhepatic portal venogram. 3.  Successful catheterization of a distal jejunal branch of the superior mesenteric vein. 4.  Superior mesenteric venogram.  Interventional Radiologist: Ruel Favors, MD with Sterling Big, MD assisting.  Sedation: Moderate (conscious) sedation was used.  6 mg Versed, 275 mcg Fentanyl were administered intravenously.  The patient's vital signs were monitored continuously by radiology nursing throughout the procedure.  Sedation Time: 92 minutes  Fluoroscopy time: 31 minutes 48 seconds  Contrast volume: 40 ml Omnipaque-300  PROCEDURE/FINDINGS:   Informed consent was obtained from the patient following explanation of the procedure, risks, benefits and alternatives. The patient understands, agrees and consents for the procedure. All questions were addressed. A time out was performed.  Maximal barrier sterile technique utilized including caps, mask, sterile gowns, sterile gloves, large sterile drape, hand hygiene, and betadine skin prep.  The right upper quadrant was interrogated with ultrasound.  A suitable branch of the right portal vein was identified.  Local anesthesia was attained by infiltration of 1% lidocaine.  A small dermatotomy was made with a #11 blade.  A 21 gauge Accustick needle was then advanced under real time ultrasound guidance and used to puncture the  portal vein branch.  A 0.018-inch wire was advanced into the central portal veins.  The Accustick sheath was then advanced over a 0.018-inch wire.  The wire was exchanged for a short 0.035-inch Amplatz wire. Accustick sheath was exchanged  for a working 6-French vascular sheath.  An initial portal venogram was performed through the percutaneous transhepatic sheath.  The right and left main portal veins are patent.  There is a rounded filling defect within the main portal vein at the confluence consistent with the previously identified thrombus.  The catheter was slowly advanced into the chronic thrombus using a hydrophilic Roadrunner wire.  Hand injection of contrast material from within the thrombus demonstrates that the thrombus is almost totally occlusive.  The thrombus is very firm consistent with a chronic clot.  Ultimately, the catheter was navigated into a distal jejunal branch vessel wall of the superior mesenteric vein with free return of blood flow.  A superior mesenteric venogram was then performed which demonstrated near total occlusion of the SMA and main portal vein with an extensive network of portosystemic collateral vessels extending from the jejunal branches along the pancreaticoduodenal arcade and into the portal venous system.  The findings were discussed with Dr. Donell Beers via telephone.  The decision was made jointly not to proceed further as any continued effort is likely to provide sufficient decompression of the newly developed collateral vessels to promote surgical candidacy.  The catheter was removed.  The sheath was drawn back into the intrahepatic portion of the liver.  Several Gelfoam torpedoes were used to embolize the transhepatic tract.  The sheath was ultimately removed.  Post procedural ultrasound interrogation of the right upper quadrant demonstrates no active peri hepatic bleeding or perihepatic hematoma.  The patient tolerated the procedure well; there was no immediate complication.  IMPRESSION:  1.  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  2.   Transhepatic superior mesenteric venogram from a distal jejunal branch demonstrates an extensive network of peripancreatic and peri duodenal portosystemic collateral veins.   Original Report Authenticated By: Malachy Moan, M.D.   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 She presents to interventional radiology for attempted percutaneous transhepatic thrombolysis/thrombectomy procedure.  Procedures Performed: 5.  Gelfoam plug embolization of the transhepatic tract 1. Ultrasound-guided puncture of the right portal vein branch 2.  The transhepatic portal venogram. 3.  Successful catheterization of a distal jejunal branch of the superior mesenteric vein. 4.  Superior mesenteric venogram.  Interventional Radiologist: Ruel Favors, MD with Sterling Big, MD assisting.  Sedation: Moderate (conscious) sedation was used.  6 mg Versed, 275 mcg Fentanyl were administered intravenously.  The patient's vital signs were monitored continuously by radiology nursing throughout the procedure.  Sedation Time: 92 minutes  Fluoroscopy time: 31 minutes 48 seconds  Contrast volume: 40 ml Omnipaque-300  PROCEDURE/FINDINGS:   Informed consent was obtained from the patient following explanation of the procedure, risks, benefits and alternatives. The patient understands, agrees and consents for the procedure. All questions were addressed. A time out was performed.  Maximal barrier sterile technique utilized including caps, mask, sterile gowns, sterile gloves, large sterile drape, hand hygiene, and betadine skin prep.  The right  upper quadrant was interrogated with ultrasound.  A suitable branch of the right portal vein was identified.  Local anesthesia was attained by infiltration of 1% lidocaine.   A small dermatotomy was made with a #11 blade.  A 21 gauge Accustick needle was then advanced under real time ultrasound guidance and used to puncture the portal vein branch.  A 0.018-inch wire was advanced into the central portal veins.  The Accustick sheath was then advanced over a 0.018-inch wire.  The wire was exchanged for a short 0.035-inch Amplatz wire. Accustick sheath was exchanged for a working 6-French vascular sheath.  An initial portal venogram was performed through the percutaneous transhepatic sheath.  The right and left main portal veins are patent.  There is a rounded filling defect within the main portal vein at the confluence consistent with the previously identified thrombus.  The catheter was slowly advanced into the chronic thrombus using a hydrophilic Roadrunner wire.  Hand injection of contrast material from within the thrombus demonstrates that the thrombus is almost totally occlusive.  The thrombus is very firm consistent with a chronic clot.  Ultimately, the catheter was navigated into a distal jejunal branch vessel wall of the superior mesenteric vein with free return of blood flow.  A superior mesenteric venogram was then performed which demonstrated near total occlusion of the SMA and main portal vein with an extensive network of portosystemic collateral vessels extending from the jejunal branches along the pancreaticoduodenal arcade and into the portal venous system.  The findings were discussed with Dr. Donell Beers via telephone.  The decision was made jointly not to proceed further as any continued effort is likely to provide sufficient decompression of the newly developed collateral vessels to promote surgical candidacy.  The catheter was removed.  The sheath was drawn back into the intrahepatic portion of the liver.  Several Gelfoam torpedoes were used to embolize the transhepatic tract.  The sheath was ultimately removed.  Post procedural ultrasound interrogation of the right upper  quadrant demonstrates no active peri hepatic bleeding or perihepatic hematoma.  The patient tolerated the procedure well; there was no immediate complication.  IMPRESSION:  1.  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  2.  Transhepatic superior mesenteric venogram from a distal jejunal branch demonstrates an extensive network of peripancreatic and peri duodenal portosystemic collateral veins.   Original Report Authenticated By: Malachy Moan, M.D.   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 She presents to interventional radiology for attempted percutaneous transhepatic thrombolysis/thrombectomy procedure.  Procedures Performed: 5.  Gelfoam plug embolization of the transhepatic tract 1. Ultrasound-guided puncture of the right portal vein branch 2.  The transhepatic portal venogram. 3.  Successful catheterization of a distal jejunal branch of the superior mesenteric vein. 4.  Superior mesenteric venogram.  Interventional Radiologist: Ruel Favors, MD with Sterling Big, MD assisting.  Sedation: Moderate (conscious) sedation was used.  6 mg Versed, 275 mcg Fentanyl were administered intravenously.  The patient's vital signs were monitored continuously by radiology nursing throughout the procedure.  Sedation Time: 92 minutes  Fluoroscopy time: 31 minutes 48 seconds  Contrast volume: 40 ml Omnipaque-300  PROCEDURE/FINDINGS:   Informed consent  was obtained from the patient following explanation of the procedure, risks, benefits and alternatives. The patient understands, agrees and consents for the procedure. All  questions were addressed. A time out was performed.  Maximal barrier sterile technique utilized including caps, mask, sterile gowns, sterile gloves, large sterile drape, hand hygiene, and betadine skin prep.  The right upper quadrant was interrogated with ultrasound.  A suitable branch of the right portal vein was identified.  Local anesthesia was attained by infiltration of 1% lidocaine.  A small dermatotomy was made with a #11 blade.  A 21 gauge Accustick needle was then advanced under real time ultrasound guidance and used to puncture the portal vein branch.  A 0.018-inch wire was advanced into the central portal veins.  The Accustick sheath was then advanced over a 0.018-inch wire.  The wire was exchanged for a short 0.035-inch Amplatz wire. Accustick sheath was exchanged for a working 6-French vascular sheath.  An initial portal venogram was performed through the percutaneous transhepatic sheath.  The right and left main portal veins are patent.  There is a rounded filling defect within the main portal vein at the confluence consistent with the previously identified thrombus.  The catheter was slowly advanced into the chronic thrombus using a hydrophilic Roadrunner wire.  Hand injection of contrast material from within the thrombus demonstrates that the thrombus is almost totally occlusive.  The thrombus is very firm consistent with a chronic clot.  Ultimately, the catheter was navigated into a distal jejunal branch vessel wall of the superior mesenteric vein with free return of blood flow.  A superior mesenteric venogram was then performed which demonstrated near total occlusion of the SMA and main portal vein with an extensive network of portosystemic collateral vessels extending from the jejunal branches along the pancreaticoduodenal arcade and into the portal venous system.  The findings were discussed with Dr. Donell Beers via telephone.  The decision was made jointly not to proceed further as any continued  effort is likely to provide sufficient decompression of the newly developed collateral vessels to promote surgical candidacy.  The catheter was removed.  The sheath was drawn back into the intrahepatic portion of the liver.  Several Gelfoam torpedoes were used to embolize the transhepatic tract.  The sheath was ultimately removed.  Post procedural ultrasound interrogation of the right upper quadrant demonstrates no active peri hepatic bleeding or perihepatic hematoma.  The patient tolerated the procedure well; there was no immediate complication.  IMPRESSION:  1.  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  2.  Transhepatic superior mesenteric venogram from a distal jejunal branch demonstrates an extensive network of peripancreatic and peri duodenal portosystemic collateral veins.   Original Report Authenticated By: Malachy Moan, M.D.   Dg Chest Port 1 View  11/19/2012   CLINICAL DATA:  Status post port placement  EXAM: PORTABLE CHEST - 1 VIEW  COMPARISON:  10/01/2012  FINDINGS: A left chest wall port is now seen with catheter tip at the cavoatrial junction. No pneumothorax is noted. The overall inspiratory effort is poor. This creates crowding of the vascular markings. Cardiac shadow is stable.  IMPRESSION: No pneumothorax following port placement.   Electronically Signed   By: Alcide Clever   On: 11/19/2012 13:08   Dg C-arm 61-120 Min-no Report  11/19/2012   CLINICAL DATA: placement of port-a-cath   C-ARM 61-120 MINUTES  Fluoroscopy was utilized by the requesting physician.  No radiographic  interpretation.     Medications: I have reviewed the patient's current medications.  Assessment/Plan:  1. Adenocarcinoma pancreas, pancreas head mass, endoscopic ultrasound on 10/17/2012 confirmed a pancreas head mass without vascular invasion  or surrounding lymphadenopathy, biopsy positive for adenocarcinoma.   Elevated CA 19-9.  Initiation of gemcitabine/Abraxane on a three out of four week schedule on 11/21/2012. 2. 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 She has been transition to Coumadin. 3. Diabetes  4. Right hydronephrosis-? Related to history of kidney stones versus carcinomatosis  5. "Cirrhotic "changes and splenomegaly reported on the chest CT 10/25/2012  6. History of kidney stones  7. Hypothyroidism  8. Normocytic anemia-? Anemia of chronic disease  9. 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  10. Pain secondary to pancreas cancer-adequate relief with tramadol at present.  Disposition-Ms. Benoist appears stable. She seems to tolerated the first 2 treatments with gemcitabine/Abraxane well overall. Plan to proceed with cycle 1 week 3 today as scheduled. She will return for cycle 2 week 1 on 12/19/2012. We will obtain a repeat CA 19-9 on 12/26/2012. She will be seen in followup on 01/02/2013. She will contact the office in the interim with any problems.  Plan reviewed with Dr. Truett Perna.   Lonna Cobb ANP/GNP-BC

## 2012-12-05 NOTE — Telephone Encounter (Signed)
pt needed to change time of cc done...emialed Gujrati about change

## 2012-12-09 ENCOUNTER — Ambulatory Visit: Payer: 59

## 2012-12-09 ENCOUNTER — Other Ambulatory Visit: Payer: Self-pay | Admitting: Family Medicine

## 2012-12-09 NOTE — Telephone Encounter (Signed)
Received refill request electronically. Last office visit 05/28/12. Is it okay to refill medication?

## 2012-12-10 ENCOUNTER — Telehealth: Payer: Self-pay | Admitting: *Deleted

## 2012-12-10 ENCOUNTER — Ambulatory Visit: Payer: 59

## 2012-12-10 ENCOUNTER — Other Ambulatory Visit: Payer: 59 | Admitting: Lab

## 2012-12-10 NOTE — Telephone Encounter (Signed)
Received phone call from patient.  She wanted to review her medications,  discuss the schedule, know what they are for,  and how she should take them.  She stated she has been having "bad" diarrhea every morning.  She stated she take Imodium for it and it helps it.  She stated she has pain and nausea.  She has not been taking her pain and nausea medications as prescribed.  This was reviewed with patient.   She understands to go to the ED or call [phone] if her symptoms should worsen.  She stated she did not want to come in and see MD, just wanted to review medications and tips for eating.  Teach back method was used for teaching.  Emotional support was given.  She stated she felt better.

## 2012-12-13 ENCOUNTER — Ambulatory Visit (HOSPITAL_BASED_OUTPATIENT_CLINIC_OR_DEPARTMENT_OTHER): Payer: 59 | Admitting: Pharmacist

## 2012-12-13 ENCOUNTER — Encounter (INDEPENDENT_AMBULATORY_CARE_PROVIDER_SITE_OTHER): Payer: Commercial Managed Care - PPO | Admitting: General Surgery

## 2012-12-13 ENCOUNTER — Ambulatory Visit: Payer: 59

## 2012-12-13 ENCOUNTER — Other Ambulatory Visit (HOSPITAL_BASED_OUTPATIENT_CLINIC_OR_DEPARTMENT_OTHER): Payer: 59 | Admitting: Lab

## 2012-12-13 ENCOUNTER — Ambulatory Visit (INDEPENDENT_AMBULATORY_CARE_PROVIDER_SITE_OTHER): Payer: 59

## 2012-12-13 ENCOUNTER — Telehealth: Payer: Self-pay | Admitting: Oncology

## 2012-12-13 DIAGNOSIS — E538 Deficiency of other specified B group vitamins: Secondary | ICD-10-CM

## 2012-12-13 DIAGNOSIS — N1330 Unspecified hydronephrosis: Secondary | ICD-10-CM

## 2012-12-13 DIAGNOSIS — C259 Malignant neoplasm of pancreas, unspecified: Secondary | ICD-10-CM

## 2012-12-13 DIAGNOSIS — I81 Portal vein thrombosis: Secondary | ICD-10-CM

## 2012-12-13 LAB — CBC WITH DIFFERENTIAL/PLATELET
Basophils Absolute: 0 10*3/uL (ref 0.0–0.1)
Eosinophils Absolute: 0.1 10*3/uL (ref 0.0–0.5)
HGB: 8.4 g/dL — ABNORMAL LOW (ref 11.6–15.9)
LYMPH%: 34.6 % (ref 14.0–49.7)
MCV: 81.8 fL (ref 79.5–101.0)
MONO#: 0.4 10*3/uL (ref 0.1–0.9)
MONO%: 13 % (ref 0.0–14.0)
NEUT#: 1.5 10*3/uL (ref 1.5–6.5)
RBC: 3.19 10*6/uL — ABNORMAL LOW (ref 3.70–5.45)
RDW: 17.8 % — ABNORMAL HIGH (ref 11.2–14.5)
WBC: 2.9 10*3/uL — ABNORMAL LOW (ref 3.9–10.3)
lymph#: 1 10*3/uL (ref 0.9–3.3)
nRBC: 0 % (ref 0–0)

## 2012-12-13 LAB — PROTIME-INR

## 2012-12-13 LAB — PROTHROMBIN TIME
INR: 6.65 (ref <1.50)
Prothrombin Time: 55.2 seconds — ABNORMAL HIGH (ref 11.6–15.2)

## 2012-12-13 LAB — POCT INR: INR: 6.65

## 2012-12-13 MED ORDER — CYANOCOBALAMIN 1000 MCG/ML IJ SOLN
1000.0000 ug | Freq: Once | INTRAMUSCULAR | Status: AC
  Administered 2012-12-13: 1000 ug via INTRAMUSCULAR

## 2012-12-13 NOTE — Progress Notes (Signed)
INR elevated this morning requiring lab send out. INR returned high at 6.65. Spoke with patient over the phone once INR results were available. Pt reports no issues regarding anticoagulation No bleeding or bruising She states she feels well with only some mild light-headedness at times She does state that she sometimes notices a metallic taste in her mouth (possibly chemo treatment related?) No missed or extra doses Pt states she took coumadin this morning already before her lab appointment Instructed patient to hold coumadin over the weekend and recheck on Monday Lab on Monday 110/27/14 at 10:15am and coumadin clinic at 10:30am

## 2012-12-13 NOTE — Telephone Encounter (Signed)
pt would like to have b12 here if she has appt...emailed Dr. Truett Perna

## 2012-12-13 NOTE — Patient Instructions (Addendum)
Hold Coumadin over the weekend (saturday and Sunday) then recheck INR on Monday 01/16/13 at 10:15am

## 2012-12-16 ENCOUNTER — Ambulatory Visit (HOSPITAL_BASED_OUTPATIENT_CLINIC_OR_DEPARTMENT_OTHER): Payer: 59 | Admitting: Pharmacist

## 2012-12-16 ENCOUNTER — Other Ambulatory Visit: Payer: Self-pay | Admitting: Oncology

## 2012-12-16 ENCOUNTER — Other Ambulatory Visit (HOSPITAL_BASED_OUTPATIENT_CLINIC_OR_DEPARTMENT_OTHER): Payer: 59 | Admitting: Lab

## 2012-12-16 DIAGNOSIS — N1330 Unspecified hydronephrosis: Secondary | ICD-10-CM

## 2012-12-16 DIAGNOSIS — I81 Portal vein thrombosis: Secondary | ICD-10-CM

## 2012-12-16 DIAGNOSIS — C259 Malignant neoplasm of pancreas, unspecified: Secondary | ICD-10-CM

## 2012-12-16 LAB — PROTIME-INR
INR: 2.5 (ref 2.00–3.50)
Protime: 30 Seconds — ABNORMAL HIGH (ref 10.6–13.4)

## 2012-12-16 NOTE — Progress Notes (Signed)
INR back at goal after holding over the weekend with an INR of 6.65 on 12/13/12.  INR may have been increased due to decreased appetite when Theresa Summers receives chemo and steroids with chemo.  Will resume Coumadin at a lower dose of coumadin 7.5mg  daily.  Will check PT/INR in 3 days when Theresa Summers is at [facility] for chemo.

## 2012-12-19 ENCOUNTER — Ambulatory Visit (HOSPITAL_BASED_OUTPATIENT_CLINIC_OR_DEPARTMENT_OTHER): Payer: 59

## 2012-12-19 ENCOUNTER — Ambulatory Visit (HOSPITAL_BASED_OUTPATIENT_CLINIC_OR_DEPARTMENT_OTHER): Payer: 59 | Admitting: Pharmacist

## 2012-12-19 ENCOUNTER — Other Ambulatory Visit (HOSPITAL_BASED_OUTPATIENT_CLINIC_OR_DEPARTMENT_OTHER): Payer: 59 | Admitting: Lab

## 2012-12-19 DIAGNOSIS — C250 Malignant neoplasm of head of pancreas: Secondary | ICD-10-CM

## 2012-12-19 DIAGNOSIS — C259 Malignant neoplasm of pancreas, unspecified: Secondary | ICD-10-CM

## 2012-12-19 DIAGNOSIS — Z5111 Encounter for antineoplastic chemotherapy: Secondary | ICD-10-CM

## 2012-12-19 DIAGNOSIS — I81 Portal vein thrombosis: Secondary | ICD-10-CM

## 2012-12-19 DIAGNOSIS — N1330 Unspecified hydronephrosis: Secondary | ICD-10-CM

## 2012-12-19 LAB — TECHNOLOGIST REVIEW

## 2012-12-19 LAB — CBC WITH DIFFERENTIAL/PLATELET
Basophils Absolute: 0.1 10*3/uL (ref 0.0–0.1)
EOS%: 8.2 % — ABNORMAL HIGH (ref 0.0–7.0)
Eosinophils Absolute: 0.4 10*3/uL (ref 0.0–0.5)
HCT: 28.8 % — ABNORMAL LOW (ref 34.8–46.6)
HGB: 9 g/dL — ABNORMAL LOW (ref 11.6–15.9)
LYMPH%: 26.7 % (ref 14.0–49.7)
MCH: 25.9 pg (ref 25.1–34.0)
MCHC: 31.3 g/dL — ABNORMAL LOW (ref 31.5–36.0)
MCV: 82.8 fL (ref 79.5–101.0)
MONO%: 13.1 % (ref 0.0–14.0)
NEUT#: 2.6 10*3/uL (ref 1.5–6.5)
NEUT%: 50.4 % (ref 38.4–76.8)
Platelets: 233 10*3/uL (ref 145–400)
RDW: 18.4 % — ABNORMAL HIGH (ref 11.2–14.5)
nRBC: 0 % (ref 0–0)

## 2012-12-19 LAB — COMPREHENSIVE METABOLIC PANEL (CC13)
ALT: 16 U/L (ref 0–55)
Alkaline Phosphatase: 52 U/L (ref 40–150)
Anion Gap: 9 mEq/L (ref 3–11)
BUN: 8.4 mg/dL (ref 7.0–26.0)
CO2: 22 mEq/L (ref 22–29)
Glucose: 200 mg/dl — ABNORMAL HIGH (ref 70–140)
Sodium: 142 mEq/L (ref 136–145)
Total Bilirubin: 0.28 mg/dL (ref 0.20–1.20)
Total Protein: 6.1 g/dL — ABNORMAL LOW (ref 6.4–8.3)

## 2012-12-19 LAB — PROTIME-INR: INR: 1.7 — ABNORMAL LOW (ref 2.00–3.50)

## 2012-12-19 LAB — POCT INR: INR: 1.7

## 2012-12-19 MED ORDER — ONDANSETRON 8 MG/NS 50 ML IVPB
INTRAVENOUS | Status: AC
  Filled 2012-12-19: qty 8

## 2012-12-19 MED ORDER — SODIUM CHLORIDE 0.9 % IV SOLN
800.0000 mg/m2 | Freq: Once | INTRAVENOUS | Status: AC
  Administered 2012-12-19: 1748 mg via INTRAVENOUS
  Filled 2012-12-19: qty 45.97

## 2012-12-19 MED ORDER — ONDANSETRON 8 MG/50ML IVPB (CHCC)
8.0000 mg | Freq: Once | INTRAVENOUS | Status: AC
  Administered 2012-12-19: 8 mg via INTRAVENOUS

## 2012-12-19 MED ORDER — SODIUM CHLORIDE 0.9 % IJ SOLN
10.0000 mL | INTRAMUSCULAR | Status: DC | PRN
  Administered 2012-12-19: 10 mL
  Filled 2012-12-19: qty 10

## 2012-12-19 MED ORDER — DEXAMETHASONE SODIUM PHOSPHATE 10 MG/ML IJ SOLN
10.0000 mg | Freq: Once | INTRAMUSCULAR | Status: AC
  Administered 2012-12-19: 10 mg via INTRAVENOUS

## 2012-12-19 MED ORDER — SODIUM CHLORIDE 0.9 % IV SOLN
Freq: Once | INTRAVENOUS | Status: AC
  Administered 2012-12-19: 13:00:00 via INTRAVENOUS

## 2012-12-19 MED ORDER — HEPARIN SOD (PORK) LOCK FLUSH 100 UNIT/ML IV SOLN
500.0000 [IU] | Freq: Once | INTRAVENOUS | Status: AC | PRN
  Administered 2012-12-19: 500 [IU]
  Filled 2012-12-19: qty 5

## 2012-12-19 MED ORDER — DEXAMETHASONE SODIUM PHOSPHATE 10 MG/ML IJ SOLN
INTRAMUSCULAR | Status: AC
  Filled 2012-12-19: qty 1

## 2012-12-19 MED ORDER — PACLITAXEL PROTEIN-BOUND CHEMO INJECTION 100 MG
100.0000 mg/m2 | Freq: Once | INTRAVENOUS | Status: AC
  Administered 2012-12-19: 225 mg via INTRAVENOUS
  Filled 2012-12-19: qty 45

## 2012-12-19 NOTE — Patient Instructions (Signed)
INR below goal after dose decrease Starting today increase dose to Coumadin 7.5mg  daily (1 1/2 tablet) except for 10 mg (2 tablets) on Tuesdays and Thursdays.   Will check PT/INR on 12/27/12.  Lab is scheduled at 11:15, then chemo.  We will see you in the infusion room.

## 2012-12-19 NOTE — Patient Instructions (Signed)
[facility] Discharge Instructions for Patients Receiving Chemotherapy  Today you received the following chemotherapy agents: abraxane, gemzar   To help prevent nausea and vomiting after your treatment, we encourage you to take your nausea medication.  Take it as often as prescribed.     If you develop nausea and vomiting that is not controlled by your nausea medication, call the clinic. If it is after clinic hours your family physician or the after hours number for the clinic or go to the Emergency Department.   BELOW ARE SYMPTOMS THAT SHOULD BE REPORTED IMMEDIATELY:  *FEVER GREATER THAN 100.5 F  *CHILLS WITH OR WITHOUT FEVER  NAUSEA AND VOMITING THAT IS NOT CONTROLLED WITH YOUR NAUSEA MEDICATION  *UNUSUAL SHORTNESS OF BREATH  *UNUSUAL BRUISING OR BLEEDING  TENDERNESS IN MOUTH AND THROAT WITH OR WITHOUT PRESENCE OF ULCERS  *URINARY PROBLEMS  *BOWEL PROBLEMS  UNUSUAL RASH Items with * indicate a potential emergency and should be followed up as soon as possible.  Feel free to call the clinic you have any questions or concerns. The clinic phone number is [phone].   I have been informed and understand all the instructions given to me. I know to contact the clinic, my physician, or go to the Emergency Department if any problems should occur. I do not have any questions at this time, but understand that I may call the clinic during office hours   should I have any questions or need assistance in obtaining follow up care.    __________________________________________  _____________  __________ Signature of Patient or Authorized Representative            Date                   Time    __________________________________________ Nurse's Signature    

## 2012-12-19 NOTE — Progress Notes (Signed)
Pt is requesting a letter to return to work part time. Wants to return on 12/30/12. Please call when ready.

## 2012-12-19 NOTE — Progress Notes (Signed)
INR at goal Pt is doing well today after INR elevation last week She was started on lower dose of 7.5 mg daily on Monday (vs. 10 mg daily) She reports taking her coumadin as instructed with no missed doses Only diet change is she had a serving of turnip greens this week Will slowly increase her coumadin dose to determine effect. Starting today increase dose to Coumadin 7.5mg  daily (1 1/2 tablet) except for 10 mg (2 tablets) on Tuesdays and Thursdays.  Will check PT/INR on 12/27/12.  Lab is scheduled at 11:15, then chemo.  We will see you in the infusion room.

## 2012-12-20 ENCOUNTER — Telehealth: Payer: Self-pay | Admitting: *Deleted

## 2012-12-20 NOTE — Telephone Encounter (Signed)
Needs a note and can be written on script and signed that she can return to work on 12/30/12 on part time basis. She wants to return to work. Fax to her office manager, Crystal Scales [phone] at the [facility] Made her aware MD will not be in office until Monday.

## 2012-12-23 ENCOUNTER — Encounter (INDEPENDENT_AMBULATORY_CARE_PROVIDER_SITE_OTHER): Payer: Self-pay | Admitting: General Surgery

## 2012-12-23 ENCOUNTER — Other Ambulatory Visit: Payer: Self-pay | Admitting: Nurse Practitioner

## 2012-12-23 ENCOUNTER — Ambulatory Visit (INDEPENDENT_AMBULATORY_CARE_PROVIDER_SITE_OTHER): Payer: Commercial Managed Care - PPO | Admitting: General Surgery

## 2012-12-23 VITALS — BP 106/54 | HR 90 | Temp 98.4°F | Resp 16 | Ht 66.5 in | Wt 222.0 lb

## 2012-12-23 DIAGNOSIS — C259 Malignant neoplasm of pancreas, unspecified: Secondary | ICD-10-CM

## 2012-12-23 NOTE — Assessment & Plan Note (Signed)
I was able to access her Port-A-Cath.  I advised the patient to have the cancer center call us if she has any issues with her Port-A-Cath on Friday. It does not appear that the Port-A-Cath is infected.   I suspect that she probably had a seroma around the Port-A-Cath and that the first several nurses ended up aspirating. I think that when the third nurse tried that she was able to access the port since the seroma was gone.  If she has difficulty again, I would refer her to IR for port study.

## 2012-12-23 NOTE — Progress Notes (Signed)
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 I placed a Port-A-Cath on her around a month ago. She had difficulty with her Port-A-Cath last week when they tried to access it. There is also description of it being red and itchy. I was asked to evaluate this.  The patient states that the itchiness and redness has resolved. She states that the third nurse was able to draw blood and use it for chemotherapy. She does not have any pain in this site.    EXAM: General:  Alert and oriented. Incision:  No evidence of infection.     PATHOLOGY: n/a   ASSESSMENT AND PLAN:   Pancreatic cancer I was able to access her Port-A-Cath.  I advised the patient to have the cancer center call us if she has any issues with her Port-A-Cath on Friday. It does not appear that the Port-A-Cath is infected.   I suspect that she probably had a seroma around the Port-A-Cath and that the first several nurses ended up aspirating. I think that when the third nurse tried that she was able to access the port since the seroma was gone.  If she has difficulty again, I would refer her to IR for port study.       Maudry Diego, MD Surgical Oncology, General & Endocrine Surgery [facility]  Ruthe Mannan, MD Dianne Dun, MD

## 2012-12-23 NOTE — Patient Instructions (Signed)
Ask them to call if you have a problem again with your port this Friday.

## 2012-12-25 ENCOUNTER — Telehealth: Payer: Self-pay | Admitting: *Deleted

## 2012-12-25 ENCOUNTER — Ambulatory Visit (HOSPITAL_BASED_OUTPATIENT_CLINIC_OR_DEPARTMENT_OTHER): Payer: 59 | Admitting: Nurse Practitioner

## 2012-12-25 ENCOUNTER — Ambulatory Visit (HOSPITAL_BASED_OUTPATIENT_CLINIC_OR_DEPARTMENT_OTHER): Payer: 59 | Admitting: Lab

## 2012-12-25 ENCOUNTER — Ambulatory Visit (HOSPITAL_COMMUNITY)
Admission: RE | Admit: 2012-12-25 | Discharge: 2012-12-25 | Disposition: A | Discharge status: Home / Self Care | Payer: 59 | Source: Ambulatory Visit | Attending: Oncology | Admitting: Oncology

## 2012-12-25 VITALS — BP 146/52 | HR 96 | Temp 98.4°F | Resp 18 | Ht 66.5 in | Wt 220.4 lb

## 2012-12-25 DIAGNOSIS — C259 Malignant neoplasm of pancreas, unspecified: Secondary | ICD-10-CM

## 2012-12-25 DIAGNOSIS — M7989 Other specified soft tissue disorders: Secondary | ICD-10-CM | POA: Insufficient documentation

## 2012-12-25 DIAGNOSIS — C250 Malignant neoplasm of head of pancreas: Secondary | ICD-10-CM

## 2012-12-25 DIAGNOSIS — L539 Erythematous condition, unspecified: Secondary | ICD-10-CM | POA: Insufficient documentation

## 2012-12-25 DIAGNOSIS — Z86718 Personal history of other venous thrombosis and embolism: Secondary | ICD-10-CM

## 2012-12-25 DIAGNOSIS — N1330 Unspecified hydronephrosis: Secondary | ICD-10-CM

## 2012-12-25 DIAGNOSIS — M79609 Pain in unspecified limb: Secondary | ICD-10-CM | POA: Insufficient documentation

## 2012-12-25 DIAGNOSIS — I81 Portal vein thrombosis: Secondary | ICD-10-CM

## 2012-12-25 DIAGNOSIS — C801 Malignant (primary) neoplasm, unspecified: Secondary | ICD-10-CM

## 2012-12-25 LAB — CBC WITH DIFFERENTIAL/PLATELET
Basophils Absolute: 0 10*3/uL (ref 0.0–0.1)
Eosinophils Absolute: 0 10*3/uL (ref 0.0–0.5)
HGB: 8.3 g/dL — ABNORMAL LOW (ref 11.6–15.9)
MCV: 81.4 fL (ref 79.5–101.0)
MONO#: 0.3 10*3/uL (ref 0.1–0.9)
MONO%: 11.9 % (ref 0.0–14.0)
NEUT#: 1.3 10*3/uL — ABNORMAL LOW (ref 1.5–6.5)
Platelets: 189 10*3/uL (ref 145–400)
RDW: 18.5 % — ABNORMAL HIGH (ref 11.2–14.5)
WBC: 2.5 10*3/uL — ABNORMAL LOW (ref 3.9–10.3)

## 2012-12-25 LAB — PROTIME-INR: INR: 1.7 — ABNORMAL LOW (ref 2.00–3.50)

## 2012-12-25 MED ORDER — ENOXAPARIN SODIUM 150 MG/ML ~~LOC~~ SOLN
1.5000 mg/kg | Freq: Once | SUBCUTANEOUS | Status: AC
  Administered 2012-12-25: 150 mg via SUBCUTANEOUS
  Filled 2012-12-25: qty 1

## 2012-12-25 NOTE — Patient Instructions (Signed)
Enoxaparin, Home Use °Enoxaparin (Lovenox®) injection is a medication used to prevent clots from developing in your veins. Medications such as enoxaparin are called blood thinners or anticoagulants. If blood clots are untreated they could travel to your lungs. This is called a pulmonary embolus. A blood clot in your lungs can be fatal. °Caregivers often use anticoagulants such as enoxaparin to prevent clots following surgery. It is also used along with aspirin when the heart is not getting enough blood. °Continue the enoxaparin injections as directed by your caregiver. Your caregiver will use blood clotting test results to decide when you can safely stop using enoxaparin injections. If your caregiver prescribes any additional anticoagulant, you must take it exactly as directed. °RISKS AND COMPLICATIONS °· If you have received recent epidural anesthesia, spinal anesthesia, or a spinal tap while receiving anticoagulants, you are at risk for developing a blood clot in or around the spine. This condition could result in long-term or permanent paralysis. °· Because anticoagulants thin your blood, severe bleeding may occur from any tissue or organ. Symptoms of the blood being too thin may include: °· Bleeding from the nose or gums that does not stop quickly. °· Unusual bruising or bruising easily. °· Swelling or pain at an injection site. °· A cut that does not stop bleeding within 10 minutes. °· Continual nausea for more than 1 day or vomiting blood. °· Coughing up blood. °· Blood in the urine which may appear as pink, red, or brown urine. °· Blood in bowel movements which may appear as red, dark or black stools. °· Sudden weakness or numbness of the face, arm, or leg, especially on one side of the body. °· Sudden confusion. °· Trouble speaking (aphasia) or understanding. °· Sudden trouble seeing in one or both eyes. °· Sudden trouble walking. °· Dizziness. °· Loss of balance or coordination. °· Severe pain, such as a  headache, joint pain, or back pain. °· Fever. °· Bruising around the injection sites may be expected. °· Platelet drops, known as "thrombocytopenia," can occur with enoxaparin use. A condition called "heparin-induced thrombocytopenia" has been seen. If you have had this condition, you should tell your caregiver. Your caregiver may direct you to have blood tests to monitor this condition. °· Do not use if you have allergies to the medication, heparin, or pork products. °· Other side effects may include mild local reactions or irritation at the site of injection, pain, bruising, and redness of skin. °HOME CARE INSTRUCTIONS °You will be instructed by your caregiver how to give enoxaparin injections. °1. Before giving your medication you should make sure the injection is a clear and colorless or pale yellow solution. If your medication becomes discolored or has particles in the bottle, do not use and notify your caregiver. °2. When using the 30 and 40 mg pre-filled syringes, do not expel the air bubble from the syringe before the injection. This makes sure you use all the medication in the syringe. °3. The injections will be given subcutaneously. This means it is given into the fat over the belly (abdomen). It is given deep beneath the skin but not into the muscle. The shots should be injected around the abdominal wall. Change the sites of injection each time. The whole length of the needle should be introduced into a skin fold held between the thumb and forefinger; the skin fold should be held throughout the injection. Do not rub the injection site after completion of the injection. This increases bruising. Enoxaparin injection pre-filled syringes   and graduated pre-filled syringes are available with a system that shields the needle after injection. °4. Inject by pushing the plunger to the bottom of the syringe. °5. Remove the syringe from the injection site keeping your finger on the plunger rod. Be careful not to  stick yourself or others. °6. After injection and the syringe is empty, set off the safety system by firmly pushing the plunger rod. The protective sleeve will automatically cover the needle and you can hear a click. The click means your needle is safely covered. Do not try replacing the needle shield. °7. Get rid of the syringe in the nearest sharps container. °8. Keep your medication safely stored at room temperatures. °· Due to the complications of anticoagulants, it is very important that you take your anticoagulant as directed by your caregiver. Anticoagulants need to be taken exactly as instructed. Be sure you understand all your anticoagulant instructions. °· Changes in medicines, supplements, diet, and illness can affect your anticoagulation therapy. Be sure to inform your caregivers of any of these changes. °· While on anticoagulants, you will need to have blood tests done routinely as directed by your caregivers. °· Be careful not to cut yourself when using sharp objects. °· Limit physical activities or sports that could result in a fall or cause injury. °· It is extremely important that you tell all of your caregivers and dentist that you are taking an anticoagulant, especially if you are injured or plan to have any type of procedure or operation. °· Follow up with your laboratory test and caregiver appointments as directed. It is very important to keep your appointments. Not keeping appointments could result in a chronic or permanent injury, pain, or disability. °SEEK MEDICAL CARE IF: °· You develop any rashes. °· You have any worsening of the condition for which you are receiving anticoagulation therapy. °SEEK IMMEDIATE MEDICAL CARE IF: °· Bleeding from the nose or gums does not stop quickly. °· You have unusual bruising or are bruising easily. °· Swelling or pain occurs at an injection site. °· A cut does not stop bleeding within 10 minutes. °· You have continual nausea for more than 1 day or are  vomiting blood. °· You are coughing up blood. °· You have blood in the urine. °· You have dark or black stools. °· You have sudden weakness or numbness of the face, arm, or leg, especially on one side of the body. °· You have sudden confusion. °· You have trouble speaking (aphasia) or understanding. °· You have sudden trouble seeing in one or both eyes. °· You have sudden trouble walking. °· You have dizziness. °· You have a loss of balance or coordination. °· You have severe pain, such as a headache, joint pain, or back pain. °· You have a serious fall or head injury, even if you are not bleeding. °· You have an oral temperature above 102° F (38.9° C), not controlled by medicine. °ANY OF THESE SYMPTOMS MAY REPRESENT A SERIOUS PROBLEM THAT IS AN EMERGENCY. Do not wait to see if the symptoms will go away. Get medical help right away. Call your local emergency services (911 in [place] DO NOT drive yourself to the hospital. °MAKE SURE YOU: °· Understand these instructions. °· Will watch your condition. °· Will get help right away if you are not doing well or get worse. °Document Released: 12/09/2003 Document Revised: 05/01/2011 Document Reviewed: 02/06/2005 °[employer]® Patient Information ©2014 [employer]. ° °

## 2012-12-25 NOTE — Telephone Encounter (Signed)
Call from pt reporting pain, redness and swelling in lower R leg. Area is tender to touch and slightly warm. Denies pain with ambulation. Reviewed with Dr. Truett Perna: Order received to bring pt in to have leg evaluated.

## 2012-12-25 NOTE — Progress Notes (Signed)
VASCULAR LAB PRELIMINARY  PRELIMINARY  PRELIMINARY  PRELIMINARY  Right lower extremity venous duplex completed.    Preliminary report:  Right:  No evidence of DVT, superficial thrombosis, or Baker's cyst.  Denaisha Swango, RVS 12/25/2012, 4:47 PM

## 2012-12-25 NOTE — Progress Notes (Signed)
OFFICE PROGRESS NOTE  Interval history:  Theresa Summers is a 72 year old woman with pancreas cancer. She is not a candidate for Whipple procedure. She began treatment with gemcitabine/Abraxane weekly x3 followed by a one-week break on 11/21/2012. She last received chemotherapy on 12/19/2012.  She is maintained on Coumadin for a portal vein thrombosis. The INR was supratherapeutic on 12/13/2012 at 6.65. Coumadin was held. Followup INR on 12/16/2012 returned at 2.5. The Coumadin was resumed. The INR returned at 1.7 on 12/19/2012.  She is seen today for an unscheduled visit for evaluation of leg pain and swelling.   She reports a one-day history of "tenderness" at the right lower leg with associated erythema. She feels the right lower leg edematous. No known injury. She denies fever. No shortness of breath or chest pain. No cough. She has intermittent nausea. Bowels moving regularly.   Objective: Blood pressure 146/52, pulse 96, temperature 98.4 F (36.9 C), temperature source Oral, resp. rate 18, height 5' 6.5" (1.689 m), weight 220 lb 6.4 oz (99.973 kg).  Oropharynx is without thrush or ulceration. Lungs are clear. No wheezes or rales. Regular cardiac rhythm. Abdomen soft and nontender. No hepatomegaly. Marked tenderness at the lower legs bilaterally right greater than left. The right lower leg is edematous. Faint erythema at the right lower leg. Calves are nontender.  Lab Results: Lab Results  Component Value Date   WBC 2.5* 12/25/2012   HGB 8.3* 12/25/2012   HCT 25.3* 12/25/2012   MCV 81.4 12/25/2012   PLT 189 12/25/2012    Chemistry:    Chemistry      Component Value Date/Time   NA 142 12/19/2012 1120   NA 137 11/19/2012 0850   K 4.0 12/19/2012 1120   K 3.8 11/19/2012 0850   CL 103 11/19/2012 0850   CO2 22 12/19/2012 1120   CO2 23 11/19/2012 0850   BUN 8.4 12/19/2012 1120   BUN 12 11/19/2012 0850   CREATININE 0.7 12/19/2012 1120   CREATININE 0.57 11/19/2012 0850      Component Value  Date/Time   CALCIUM 8.9 12/19/2012 1120   CALCIUM 9.4 11/19/2012 0850   ALKPHOS 52 12/19/2012 1120   ALKPHOS 62 11/07/2012 0653   AST 17 12/19/2012 1120   AST 22 11/07/2012 0653   ALT 16 12/19/2012 1120   ALT 19 11/07/2012 0653   BILITOT 0.28 12/19/2012 1120   BILITOT 0.3 11/07/2012 0653       Studies/Results: No results found.  Medications: I have reviewed the patient's current medications.  Assessment/Plan:  1. Adenocarcinoma pancreas, pancreas head mass, endoscopic ultrasound on 10/17/2012 confirmed a pancreas head mass without vascular invasion or surrounding lymphadenopathy, biopsy positive for adenocarcinoma.  Elevated CA 19-9.  Initiation of gemcitabine/Abraxane on a three out of four week schedule on 11/21/2012. 2. 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 She has been transitioned to Coumadin.  3. Diabetes.  4. Right hydronephrosis-? Related to history of kidney stones versus carcinomatosis.  5. "Cirrhotic "changes and splenomegaly reported on the chest CT 10/25/2012.  6. History of kidney stones.  7. Hypothyroidism.  8. Normocytic anemia-? Anemia of chronic disease.  9. 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  10. Pain secondary to pancreas cancer-adequate relief with tramadol. 11. Pain/tenderness, edema, erythema right lower leg.  Disposition-Ms. Maresh presents to the office today with a one-day history of right lower leg pain, edema and erythema in the setting of a subtherapeutic PT/INR. Findings are  concerning for a DVT.  She will receive a dose of Lovenox 1.5 mg per kilogram subcutaneous in the office today. We are referring her for a venous Doppler.  We will contact her with further instructions regarding anticoagulation pending the Doppler result.  Plan reviewed with Dr. Cyndie Chime.  25 minutes were spent face-to-face at today's visit with  the majority of that time involved in counseling/coordination of care.   Lonna Cobb ANP/GNP-BC

## 2012-12-25 NOTE — Progress Notes (Signed)
Lovenox injection given in office. Patient given oral and written instructions on self administration. Patient verbalized understanding. Teach back done.

## 2012-12-26 ENCOUNTER — Other Ambulatory Visit: Payer: Self-pay | Admitting: Nurse Practitioner

## 2012-12-26 ENCOUNTER — Other Ambulatory Visit: Payer: Self-pay | Admitting: *Deleted

## 2012-12-26 ENCOUNTER — Telehealth: Payer: Self-pay | Admitting: Nurse Practitioner

## 2012-12-26 DIAGNOSIS — C259 Malignant neoplasm of pancreas, unspecified: Secondary | ICD-10-CM

## 2012-12-26 DIAGNOSIS — I81 Portal vein thrombosis: Secondary | ICD-10-CM

## 2012-12-26 LAB — CANCER ANTIGEN 19-9: CA 19-9: 571 U/mL — ABNORMAL HIGH (ref <35.0)

## 2012-12-26 MED ORDER — ENOXAPARIN SODIUM 150 MG/ML ~~LOC~~ SOLN: 150.0000 mg | SUBCUTANEOUS | Status: DC

## 2012-12-26 NOTE — Telephone Encounter (Signed)
I contacted Theresa Summers with instructions to continue Lovenox 150 mg subcutaneous daily (new prescription was sent to her pharmacy) and take Coumadin 10 mg today. She is scheduled to return to the office 12/27/2012 for chemotherapy and will followup with the Coumadin clinic pharmacist at that time.

## 2012-12-27 ENCOUNTER — Ambulatory Visit: Payer: 59 | Admitting: Nutrition

## 2012-12-27 ENCOUNTER — Other Ambulatory Visit: Payer: Self-pay | Admitting: Nurse Practitioner

## 2012-12-27 ENCOUNTER — Ambulatory Visit (HOSPITAL_BASED_OUTPATIENT_CLINIC_OR_DEPARTMENT_OTHER): Payer: 59

## 2012-12-27 ENCOUNTER — Other Ambulatory Visit (HOSPITAL_BASED_OUTPATIENT_CLINIC_OR_DEPARTMENT_OTHER): Payer: 59 | Admitting: Lab

## 2012-12-27 ENCOUNTER — Ambulatory Visit: Payer: 59 | Admitting: Pharmacist

## 2012-12-27 VITALS — BP 143/58 | HR 90 | Temp 98.4°F

## 2012-12-27 DIAGNOSIS — C259 Malignant neoplasm of pancreas, unspecified: Secondary | ICD-10-CM

## 2012-12-27 DIAGNOSIS — Z5111 Encounter for antineoplastic chemotherapy: Secondary | ICD-10-CM

## 2012-12-27 DIAGNOSIS — C250 Malignant neoplasm of head of pancreas: Secondary | ICD-10-CM

## 2012-12-27 DIAGNOSIS — I81 Portal vein thrombosis: Secondary | ICD-10-CM

## 2012-12-27 LAB — CBC WITH DIFFERENTIAL/PLATELET
Basophils Absolute: 0 10*3/uL (ref 0.0–0.1)
EOS%: 3.5 % (ref 0.0–7.0)
Eosinophils Absolute: 0.2 10*3/uL (ref 0.0–0.5)
HCT: 25.9 % — ABNORMAL LOW (ref 34.8–46.6)
HGB: 8.2 g/dL — ABNORMAL LOW (ref 11.6–15.9)
LYMPH%: 22 % (ref 14.0–49.7)
MCH: 26.2 pg (ref 25.1–34.0)
MCHC: 31.7 g/dL (ref 31.5–36.0)
MCV: 82.7 fL (ref 79.5–101.0)
MONO#: 0.6 10*3/uL (ref 0.1–0.9)
MONO%: 12.3 % (ref 0.0–14.0)
NEUT#: 2.9 10*3/uL (ref 1.5–6.5)
NEUT%: 61.8 % (ref 38.4–76.8)
Platelets: 184 10*3/uL (ref 145–400)

## 2012-12-27 LAB — POCT INR: INR: 1.4

## 2012-12-27 LAB — PROTIME-INR: INR: 1.4 — ABNORMAL LOW (ref 2.00–3.50)

## 2012-12-27 MED ORDER — DEXAMETHASONE SODIUM PHOSPHATE 10 MG/ML IJ SOLN
10.0000 mg | Freq: Once | INTRAMUSCULAR | Status: AC
  Administered 2012-12-27: 10 mg via INTRAVENOUS

## 2012-12-27 MED ORDER — SODIUM CHLORIDE 0.9 % IJ SOLN
10.0000 mL | INTRAMUSCULAR | Status: DC | PRN
  Administered 2012-12-27: 10 mL
  Filled 2012-12-27: qty 10

## 2012-12-27 MED ORDER — ONDANSETRON 8 MG/50ML IVPB (CHCC)
8.0000 mg | Freq: Once | INTRAVENOUS | Status: AC
  Administered 2012-12-27: 8 mg via INTRAVENOUS

## 2012-12-27 MED ORDER — SODIUM CHLORIDE 0.9 % IV SOLN
Freq: Once | INTRAVENOUS | Status: AC
  Administered 2012-12-27: 12:00:00 via INTRAVENOUS

## 2012-12-27 MED ORDER — DEXAMETHASONE SODIUM PHOSPHATE 10 MG/ML IJ SOLN
INTRAMUSCULAR | Status: AC
  Filled 2012-12-27: qty 1

## 2012-12-27 MED ORDER — ONDANSETRON 8 MG/NS 50 ML IVPB
INTRAVENOUS | Status: AC
  Filled 2012-12-27: qty 8

## 2012-12-27 MED ORDER — HEPARIN SOD (PORK) LOCK FLUSH 100 UNIT/ML IV SOLN
500.0000 [IU] | Freq: Once | INTRAVENOUS | Status: AC | PRN
  Administered 2012-12-27: 500 [IU]
  Filled 2012-12-27: qty 5

## 2012-12-27 MED ORDER — HYDROMORPHONE HCL 2 MG PO TABS: 1.0000 mg | ORAL_TABLET | ORAL | Status: DC | PRN

## 2012-12-27 MED ORDER — ONDANSETRON HCL 4 MG PO TABS: ORAL_TABLET | ORAL | Status: DC

## 2012-12-27 MED ORDER — CEPHALEXIN 500 MG PO CAPS: 500.0000 mg | ORAL_CAPSULE | Freq: Three times a day (TID) | ORAL | Status: DC

## 2012-12-27 MED ORDER — PACLITAXEL PROTEIN-BOUND CHEMO INJECTION 100 MG
100.0000 mg/m2 | Freq: Once | INTRAVENOUS | Status: AC
  Administered 2012-12-27: 225 mg via INTRAVENOUS
  Filled 2012-12-27: qty 45

## 2012-12-27 MED ORDER — SODIUM CHLORIDE 0.9 % IV SOLN
800.0000 mg/m2 | Freq: Once | INTRAVENOUS | Status: AC
  Administered 2012-12-27: 1748 mg via INTRAVENOUS
  Filled 2012-12-27: qty 45.97

## 2012-12-27 NOTE — Patient Instructions (Signed)
[facility] Discharge Instructions for Patients Receiving Chemotherapy  Today you received the following chemotherapy agents:  Abraxane and Gemzar  To help prevent nausea and vomiting after your treatment, we encourage you to take your nausea medication as ordered per MD.   If you develop nausea and vomiting that is not controlled by your nausea medication, call the clinic.   BELOW ARE SYMPTOMS THAT SHOULD BE REPORTED IMMEDIATELY:  *FEVER GREATER THAN 100.5 F  *CHILLS WITH OR WITHOUT FEVER  NAUSEA AND VOMITING THAT IS NOT CONTROLLED WITH YOUR NAUSEA MEDICATION  *UNUSUAL SHORTNESS OF BREATH  *UNUSUAL BRUISING OR BLEEDING  TENDERNESS IN MOUTH AND THROAT WITH OR WITHOUT PRESENCE OF ULCERS  *URINARY PROBLEMS  *BOWEL PROBLEMS  UNUSUAL RASH Items with * indicate a potential emergency and should be followed up as soon as possible.  Feel free to call the clinic you have any questions or concerns. The clinic phone number is [phone].    

## 2012-12-27 NOTE — Progress Notes (Signed)
Patient's weight stable and documented as 220.4 pounds.  She states she continues to have pain and nausea.  She has also had diarrhea for which she took immodium.  She thinks she is eating well.  Nutrition Diagnosis:  Weight loss improved.  Intervention:  Patient educated on strategies for eating with nausea. Recommended ginger to help with nausea. Encouraged patient keep something dry, bland and salty on hand for nausea and take medications as prescribed.  Educated her on strategies for eating to minimize diarrhea.  Teach back method used.  Monitoring, Evaluation, Goals:  Patient will tolerate adequate calories and protein to promote weight maintenance.  Next Visit: Thursday, November 13 during chemotherapy.

## 2012-12-27 NOTE — Progress Notes (Signed)
INR = 1.4     Goal 2-3. Patient with edema/redness/warmth in RLE.  Doppler 12/25/12 was negative for DVT.  Lonna Cobb, NP and Dr. Truett Perna have both assessed RLE today.   Due to concern for possible cellulitis, patient is being started on Keflex 500 mg tid today. No other medication changes. No dietary changes per patient. Patient had supratherapeutic INR at increased dose within the last month.   She will take Coumadin 10 mg today, tomorrow, and Sunday. Patient will return to clinic for INR on Monday morning at 8am.   Since patient's first day back at work is Monday 11/10 she will come in at 8am and have labs drawn.   Pharmacy will call her with further instructions for Coumadin dosing. Above plans discussed with Dr. Truett Perna, he does not want to bridge with Lovenox over the weekend. Cletis Athens, PharmD

## 2012-12-30 ENCOUNTER — Ambulatory Visit (INDEPENDENT_AMBULATORY_CARE_PROVIDER_SITE_OTHER): Payer: 59 | Admitting: Pharmacist

## 2012-12-30 ENCOUNTER — Other Ambulatory Visit (HOSPITAL_BASED_OUTPATIENT_CLINIC_OR_DEPARTMENT_OTHER): Payer: 59 | Admitting: Lab

## 2012-12-30 DIAGNOSIS — C259 Malignant neoplasm of pancreas, unspecified: Secondary | ICD-10-CM

## 2012-12-30 DIAGNOSIS — N1330 Unspecified hydronephrosis: Secondary | ICD-10-CM

## 2012-12-30 DIAGNOSIS — I81 Portal vein thrombosis: Secondary | ICD-10-CM

## 2012-12-30 LAB — PROTIME-INR

## 2012-12-30 LAB — POCT INR: INR: 2.2

## 2012-12-30 NOTE — Progress Notes (Signed)
INR = 2.2 after pt took 10 mg daily starting Fri 11/26/12 as directed. Pt is on Keflex for RLE cellulitis. I s/w pt over phone today at her work.  She states her R leg is red but is no worse.  She has had no problems ambulating. INR has jumped from 1.4 on Friday to 2.2 today.  I think this rise is a little rapid-- likely due to increased Coumadin dose & Keflex. I will have pt take Coumadin 5 mg today, 7.5 mg tomorrow & 5 mg on Wed. Recheck her INR on Thurs when she is here for tx.  We can see pt in infusion. Pt aware of plan & repeated her dosing plan w/ understanding. NO CHARGE- phone encounter. Ebony Hail, Pharm.D., CPP 12/30/2012@10 :13 AM

## 2012-12-31 ENCOUNTER — Other Ambulatory Visit: Payer: Self-pay | Admitting: *Deleted

## 2013-01-02 ENCOUNTER — Ambulatory Visit (HOSPITAL_BASED_OUTPATIENT_CLINIC_OR_DEPARTMENT_OTHER): Payer: 59 | Admitting: Oncology

## 2013-01-02 ENCOUNTER — Ambulatory Visit: Payer: 59 | Admitting: Pharmacist

## 2013-01-02 ENCOUNTER — Ambulatory Visit: Payer: 59

## 2013-01-02 ENCOUNTER — Encounter: Payer: 59 | Admitting: Nutrition

## 2013-01-02 ENCOUNTER — Telehealth: Payer: Self-pay | Admitting: *Deleted

## 2013-01-02 ENCOUNTER — Other Ambulatory Visit (HOSPITAL_BASED_OUTPATIENT_CLINIC_OR_DEPARTMENT_OTHER): Payer: 59 | Admitting: Lab

## 2013-01-02 VITALS — BP 156/64 | HR 90 | Temp 97.9°F | Resp 18 | Ht 66.5 in | Wt 225.3 lb

## 2013-01-02 DIAGNOSIS — G893 Neoplasm related pain (acute) (chronic): Secondary | ICD-10-CM

## 2013-01-02 DIAGNOSIS — N1330 Unspecified hydronephrosis: Secondary | ICD-10-CM

## 2013-01-02 DIAGNOSIS — R609 Edema, unspecified: Secondary | ICD-10-CM

## 2013-01-02 DIAGNOSIS — R109 Unspecified abdominal pain: Secondary | ICD-10-CM

## 2013-01-02 DIAGNOSIS — C250 Malignant neoplasm of head of pancreas: Secondary | ICD-10-CM

## 2013-01-02 DIAGNOSIS — C259 Malignant neoplasm of pancreas, unspecified: Secondary | ICD-10-CM

## 2013-01-02 DIAGNOSIS — L539 Erythematous condition, unspecified: Secondary | ICD-10-CM

## 2013-01-02 DIAGNOSIS — D61818 Other pancytopenia: Secondary | ICD-10-CM

## 2013-01-02 DIAGNOSIS — D6481 Anemia due to antineoplastic chemotherapy: Secondary | ICD-10-CM

## 2013-01-02 DIAGNOSIS — I81 Portal vein thrombosis: Secondary | ICD-10-CM

## 2013-01-02 DIAGNOSIS — D638 Anemia in other chronic diseases classified elsewhere: Secondary | ICD-10-CM

## 2013-01-02 LAB — CBC WITH DIFFERENTIAL/PLATELET
BASO%: 1.2 % (ref 0.0–2.0)
EOS%: 1.6 % (ref 0.0–7.0)
Eosinophils Absolute: 0 10*3/uL (ref 0.0–0.5)
HCT: 22.9 % — ABNORMAL LOW (ref 34.8–46.6)
LYMPH%: 19.8 % (ref 14.0–49.7)
MCH: 25.7 pg (ref 25.1–34.0)
MCHC: 31.4 g/dL — ABNORMAL LOW (ref 31.5–36.0)
MONO%: 18.6 % — ABNORMAL HIGH (ref 0.0–14.0)
NEUT#: 1.5 10*3/uL (ref 1.5–6.5)
NEUT%: 58.8 % (ref 38.4–76.8)
Platelets: 83 10*3/uL — ABNORMAL LOW (ref 145–400)
lymph#: 0.5 10*3/uL — ABNORMAL LOW (ref 0.9–3.3)

## 2013-01-02 LAB — PROTIME-INR

## 2013-01-02 LAB — TECHNOLOGIST REVIEW

## 2013-01-02 NOTE — Patient Instructions (Signed)
INR at goal Take coumadin 7.5 mg on tuesdays, Thursdays, and Saturdays, with 5 mg the other days of the week Return next week with scheduled appointments

## 2013-01-02 NOTE — Progress Notes (Signed)
INR at goal today. Pt was seen by Dr. Truett Perna today but did not receive treatment due to thrombocytopenia/anemia This was a Telephone Encounter - Pt called this afternoon. Pt is doing well but is a little fatigued due to her anemia with Hgb of 7.2 She reports no bleeding or bruising No missed doses of coumadin Pt states today is her last day on Keflex Her INR may start to decline with the stoppage of Keflex but this is hard to judge, for now we will keep at the lower dose as Ms. Odonovan had a recent INR > 6 in the last few weeks Plan:  Take coumadin 5 mg daily except for 7.5 mg on Tuesdays, Thursdays, and Saturdays. Recheck INR in 1 week with follow up appointments on Wednesday 01/08/13. Lab at 8:45am, Coumadin clinic at 9am, MD visit at 9:15am

## 2013-01-02 NOTE — Progress Notes (Signed)
   [facility]    OFFICE PROGRESS NOTE   INTERVAL HISTORY:   She returns for a scheduled followup visit. She has completed 5 treatments with gemcitabine/Abraxane, last on 12/27/2012. No fever or nausea/vomiting. No neuropathy symptoms. She complains of diarrhea up to 5 or 6 times per day. Imodium helps. Her chief complaint is pain and erythema at the right lower leg. When she was here on 12/25/2012 a Doppler was negative for venous thrombosis. She was placed on a course of antibiotics. The erythema persists. She continues to have abdominal pain and is taking Dilaudid twice daily. The Dilaudid helps.  Objective:  Vital signs in last 24 hours:  Blood pressure 156/64, pulse 90, temperature 97.9 F (36.6 C), temperature source Oral, resp. rate 18, height 5' 6.5" (1.689 m), weight 225 lb 4.8 oz (102.195 kg).    HEENT: No thrush or ulcers Resp: Lungs clear bilaterally Cardio: Regular rate and rhythm GI: No hepatomegaly, no mass Vascular: Trace pitting edema at the right greater the left lower leg.  Skin: Erythema at the right lower leg with demarcation posteriorly. There is associated tenderness. No palpable cord. No apparent thigh edema.   Portacath/PICC-without erythema  Lab Results:  Hemoglobin 7.2, platelets 83,000, ANC 1.5  PT/INR 2.4 CA 19-9 on 12/25/2012-571  Medications: I have reviewed the patient's current medications.  Assessment/Plan: 1. Adenocarcinoma pancreas, pancreas head mass, endoscopic ultrasound on 10/17/2012 confirmed a pancreas head mass without vascular invasion or surrounding lymphadenopathy, biopsy positive for adenocarcinoma.  Elevated CA 19-9.  Initiation of gemcitabine/Abraxane on a three out of four week schedule on 11/21/2012. 2. 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 She is maintained on Coumadin.  3. Diabetes.  4. Right hydronephrosis-?  Related to history of kidney stones versus carcinomatosis.  5. "Cirrhotic "changes and splenomegaly reported on the chest CT 10/25/2012.  6. History of kidney stones.  7. Hypothyroidism.  8. Normocytic anemia-secondary to chronic disease and chemotherapy 9. 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  10. Pain secondary to pancreas cancer-she is taking Dilaudid 11. Pain/tenderness, edema, erythema right lower leg 12/25/2012-negative duplex 12/25/2012,? Pseudo-cellulitis secondary to gemcitabine 12. Pancytopenia secondary to chemotherapy    Disposition:  Ms. Joo has completed 5 treatments with gemcitabine/Abraxane. She appears to be tolerating the chemotherapy well. She continues to have abdominal pain. The CA 19-9 was lower on 12/25/2012. She has developed pancytopenia secondary to chemotherapy. Chemotherapy will be held today. She will return for an office visit and chemotherapy as indicated in one week.  The plan is to complete 9 treatments with gemcitabine/Abraxane prior to a restaging CT evaluation.  She will continue Coumadin anticoagulation. The right lower extremity edema and erythema persists. I have a low clinical suspicion for a deep vein thrombosis. She may have "pseudo-cellulitis "related to gemcitabine. She will contact us for increased pain/erythema at the right leg or a fever. She will keep the leg elevated.   Thornton Papas, MD  01/02/2013  8:43 AM

## 2013-01-02 NOTE — Telephone Encounter (Signed)
Per staff message and POF I have scheduled appts. No available on 11/19, moved appt to 11/20  JMW

## 2013-01-05 ENCOUNTER — Other Ambulatory Visit: Payer: Self-pay | Admitting: Oncology

## 2013-01-08 ENCOUNTER — Ambulatory Visit (HOSPITAL_BASED_OUTPATIENT_CLINIC_OR_DEPARTMENT_OTHER): Payer: 59 | Admitting: Nurse Practitioner

## 2013-01-08 ENCOUNTER — Other Ambulatory Visit (HOSPITAL_BASED_OUTPATIENT_CLINIC_OR_DEPARTMENT_OTHER): Payer: 59 | Admitting: Lab

## 2013-01-08 ENCOUNTER — Ambulatory Visit: Payer: 59 | Admitting: Pharmacist

## 2013-01-08 ENCOUNTER — Ambulatory Visit (HOSPITAL_COMMUNITY)
Admission: RE | Admit: 2013-01-08 | Discharge: 2013-01-08 | Disposition: A | Discharge status: Home / Self Care | Payer: 59 | Source: Ambulatory Visit | Attending: Oncology | Admitting: Oncology

## 2013-01-08 ENCOUNTER — Encounter: Payer: 59 | Admitting: Nutrition

## 2013-01-08 ENCOUNTER — Telehealth: Payer: Self-pay | Admitting: Oncology

## 2013-01-08 ENCOUNTER — Telehealth: Payer: Self-pay | Admitting: *Deleted

## 2013-01-08 VITALS — BP 143/52 | HR 89 | Temp 98.6°F | Resp 18 | Ht 66.5 in | Wt 227.5 lb

## 2013-01-08 DIAGNOSIS — I81 Portal vein thrombosis: Secondary | ICD-10-CM

## 2013-01-08 DIAGNOSIS — M7989 Other specified soft tissue disorders: Secondary | ICD-10-CM

## 2013-01-08 DIAGNOSIS — C250 Malignant neoplasm of head of pancreas: Secondary | ICD-10-CM

## 2013-01-08 DIAGNOSIS — E119 Type 2 diabetes mellitus without complications: Secondary | ICD-10-CM

## 2013-01-08 DIAGNOSIS — C259 Malignant neoplasm of pancreas, unspecified: Secondary | ICD-10-CM

## 2013-01-08 DIAGNOSIS — R609 Edema, unspecified: Secondary | ICD-10-CM

## 2013-01-08 DIAGNOSIS — E039 Hypothyroidism, unspecified: Secondary | ICD-10-CM

## 2013-01-08 DIAGNOSIS — D61811 Other drug-induced pancytopenia: Secondary | ICD-10-CM

## 2013-01-08 DIAGNOSIS — M79609 Pain in unspecified limb: Secondary | ICD-10-CM

## 2013-01-08 LAB — PROTIME-INR
INR: 2.1 (ref 2.00–3.50)
Protime: 25.2 Seconds — ABNORMAL HIGH (ref 10.6–13.4)

## 2013-01-08 LAB — POCT INR: INR: 2.1

## 2013-01-08 LAB — CBC WITH DIFFERENTIAL/PLATELET
Basophils Absolute: 0 10*3/uL (ref 0.0–0.1)
EOS%: 2.5 % (ref 0.0–7.0)
Eosinophils Absolute: 0.2 10*3/uL (ref 0.0–0.5)
HGB: 7.8 g/dL — ABNORMAL LOW (ref 11.6–15.9)
MONO#: 0.9 10*3/uL (ref 0.1–0.9)
NEUT#: 4.2 10*3/uL (ref 1.5–6.5)
NEUT%: 69.5 % (ref 38.4–76.8)
Platelets: 286 10*3/uL (ref 145–400)
RBC: 2.97 10*6/uL — ABNORMAL LOW (ref 3.70–5.45)
RDW: 20.8 % — ABNORMAL HIGH (ref 11.2–14.5)
WBC: 6.1 10*3/uL (ref 3.9–10.3)
lymph#: 0.8 10*3/uL — ABNORMAL LOW (ref 0.9–3.3)

## 2013-01-08 LAB — TECHNOLOGIST REVIEW

## 2013-01-08 MED ORDER — DOXYCYCLINE HYCLATE 100 MG PO TABS: 100.0000 mg | ORAL_TABLET | Freq: Two times a day (BID) | ORAL | Status: DC

## 2013-01-08 NOTE — Progress Notes (Addendum)
OFFICE PROGRESS NOTE  Interval history:  Theresa Summers returns as scheduled. She was last treated with gemcitabine/Abraxane on 12/27/2012. Treatment was held on 01/02/2013 due to pancytopenia. He has occasional nausea. She denies abdominal pain. Bowels moving regularly. No numbness or tingling in her hands. She has stable neuropathy symptoms in the feet predated chemotherapy.  She continues to note bilateral leg edema right greater than left. She continues to have right lower leg pain and redness. She denies fever.  Objective: Blood pressure 143/52, pulse 89, temperature 98.6 F (37 C), temperature source Oral, resp. rate 18, height 5' 6.5" (1.689 m), weight 227 lb 8 oz (103.193 kg).  No thrush or ulcerations. Lungs are clear. Regular cardiac rhythm. Port-A-Cath site is without erythema. Abdomen is soft. No hepatomegaly. Pitting edema at the lower legs bilaterally right greater than left. Mild erythema at the right lateral and lower leg. Marked tenderness. No palpable cord.  Lab Results: Lab Results  Component Value Date   WBC 6.1 01/08/2013   HGB 7.8* 01/08/2013   HCT 24.7* 01/08/2013   MCV 83.2 01/08/2013   PLT 286 01/08/2013    Chemistry:    Chemistry      Component Value Date/Time   NA 142 12/19/2012 1120   NA 137 11/19/2012 0850   K 4.0 12/19/2012 1120   K 3.8 11/19/2012 0850   CL 103 11/19/2012 0850   CO2 22 12/19/2012 1120   CO2 23 11/19/2012 0850   BUN 8.4 12/19/2012 1120   BUN 12 11/19/2012 0850   CREATININE 0.7 12/19/2012 1120   CREATININE 0.57 11/19/2012 0850      Component Value Date/Time   CALCIUM 8.9 12/19/2012 1120   CALCIUM 9.4 11/19/2012 0850   ALKPHOS 52 12/19/2012 1120   ALKPHOS 62 11/07/2012 0653   AST 17 12/19/2012 1120   AST 22 11/07/2012 0653   ALT 16 12/19/2012 1120   ALT 19 11/07/2012 0653   BILITOT 0.28 12/19/2012 1120   BILITOT 0.3 11/07/2012 0653       Studies/Results: No results found.  Medications: I have reviewed the patient's current  medications.  Assessment/Plan:  1. Adenocarcinoma pancreas, pancreas head mass, endoscopic ultrasound on 10/17/2012 confirmed a pancreas head mass without vascular invasion or surrounding lymphadenopathy, biopsy positive for adenocarcinoma.  Elevated CA 19-9.  Initiation of gemcitabine/Abraxane on a three out of four week schedule on 11/21/2012. CA 19-9 improved at 571 on 12/25/2012. 2. 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 She is maintained on Coumadin.  3. Diabetes.  4. Right hydronephrosis-? Related to history of kidney stones versus carcinomatosis.  5. "Cirrhotic" changes and splenomegaly reported on the chest CT 10/25/2012.  6. History of kidney stones.  7. Hypothyroidism.  8. Normocytic anemia-secondary to chronic disease and chemotherapy  9. 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  10. Pain secondary to pancreas cancer-she is taking Dilaudid. She denies abdominal pain at today's visit.  11. Pain/tenderness, edema, erythema right lower leg 12/25/2012-negative duplex 12/25/2012,? Pseudo-cellulitis secondary to gemcitabine.  12. Pancytopenia secondary to chemotherapy.  Disposition-blood counts are better. Plan to proceed with treatment 6 gemcitabine/Abraxane as scheduled 01/09/2013.  She continues to have pain/tenderness and edema of the right lower leg. The erythema appears better. We are referring her for a repeat venous Doppler today. She will also complete a second course of antibiotics with doxycycline. She understands the symptoms may be pseudo-cellulitis related to gemcitabine and could worsen following treatment tomorrow. She will  contact the office if symptoms worsen.   She will return for a followup visit and chemotherapy on 01/22/2013. She will contact the office in the interim as outlined above or with any other problems.  Patient seen with Dr.  Truett Perna.  25 minutes were spent face-to-face at today's visit with the majority of that time involved in counseling/coordination of care.     Theresa Summers ANP/GNP-BC    This was a shared visit with Theresa Summers.  There is persistent mild edema, tenderness, and induration at the right lower leg. The erythema has improved. It is unclear whether the right leg changes are related to a venous thrombosis, cellulitis, or a side effect of chemotherapy. We will check a repeat Doppler of the right leg.she will complete a course of doxycycline. If the Doppler is negative and her symptoms persist we will make a referral to vascular surgery.  She will complete another cycle of gemcitabine/Abraxane on 01/09/2013.  Mancel Bale, M.D.

## 2013-01-08 NOTE — Telephone Encounter (Signed)
Call from Vascular Lab: Pt is negative for DVT. Instructed them to have pt to follow up as scheduled.

## 2013-01-08 NOTE — Telephone Encounter (Signed)
gv andp ritned appt sched and avs forpt for NOV and DEC.....sed added tx

## 2013-01-08 NOTE — Progress Notes (Signed)
INR continues to be at goal 2-3. Theresa Summers has appt with Lonna Cobb, NP and will be starting doxycyline for a cellulitis.  No bleeding or bruising.  Will continue coumadin 7.5mg  T/Th/Sat and 5mg  other days.  Will need to check PT/INR next week since Theresa Amster is starting doxycyline.  Will f/u up with her tomorrow in chemo to schedule an appt.

## 2013-01-08 NOTE — Progress Notes (Signed)
*  PRELIMINARY RESULTS* Vascular Ultrasound Right lower extremity venous duplex has been completed.  Preliminary findings: no evidence of DVT  Called report to [facility] at Dr. Kalman Drape office.   Farrel Demark, RDMS, RVT  01/08/2013, 12:47 PM

## 2013-01-09 ENCOUNTER — Other Ambulatory Visit: Payer: Self-pay | Admitting: Oncology

## 2013-01-09 ENCOUNTER — Ambulatory Visit: Payer: 59 | Admitting: Nutrition

## 2013-01-09 ENCOUNTER — Ambulatory Visit (HOSPITAL_BASED_OUTPATIENT_CLINIC_OR_DEPARTMENT_OTHER): Payer: 59

## 2013-01-09 VITALS — BP 147/71 | HR 99 | Temp 98.3°F | Resp 18

## 2013-01-09 DIAGNOSIS — C259 Malignant neoplasm of pancreas, unspecified: Secondary | ICD-10-CM

## 2013-01-09 DIAGNOSIS — C250 Malignant neoplasm of head of pancreas: Secondary | ICD-10-CM

## 2013-01-09 DIAGNOSIS — Z5111 Encounter for antineoplastic chemotherapy: Secondary | ICD-10-CM

## 2013-01-09 MED ORDER — ONDANSETRON 8 MG/50ML IVPB (CHCC)
8.0000 mg | Freq: Once | INTRAVENOUS | Status: AC
  Administered 2013-01-09: 8 mg via INTRAVENOUS

## 2013-01-09 MED ORDER — ONDANSETRON 8 MG/NS 50 ML IVPB
INTRAVENOUS | Status: AC
  Filled 2013-01-09: qty 8

## 2013-01-09 MED ORDER — SODIUM CHLORIDE 0.9 % IJ SOLN
10.0000 mL | INTRAMUSCULAR | Status: DC | PRN
  Administered 2013-01-09: 10 mL
  Filled 2013-01-09: qty 10

## 2013-01-09 MED ORDER — PACLITAXEL PROTEIN-BOUND CHEMO INJECTION 100 MG
80.0000 mg/m2 | Freq: Once | INTRAVENOUS | Status: AC
  Administered 2013-01-09: 175 mg via INTRAVENOUS
  Filled 2013-01-09: qty 35

## 2013-01-09 MED ORDER — DEXAMETHASONE SODIUM PHOSPHATE 10 MG/ML IJ SOLN
10.0000 mg | Freq: Once | INTRAMUSCULAR | Status: AC
  Administered 2013-01-09: 10 mg via INTRAVENOUS

## 2013-01-09 MED ORDER — SODIUM CHLORIDE 0.9 % IV SOLN
Freq: Once | INTRAVENOUS | Status: AC
  Administered 2013-01-09: 15:00:00 via INTRAVENOUS

## 2013-01-09 MED ORDER — HEPARIN SOD (PORK) LOCK FLUSH 100 UNIT/ML IV SOLN
500.0000 [IU] | Freq: Once | INTRAVENOUS | Status: AC | PRN
  Administered 2013-01-09: 500 [IU]
  Filled 2013-01-09: qty 5

## 2013-01-09 MED ORDER — DEXAMETHASONE SODIUM PHOSPHATE 10 MG/ML IJ SOLN
INTRAMUSCULAR | Status: AC
  Filled 2013-01-09: qty 1

## 2013-01-09 MED ORDER — SODIUM CHLORIDE 0.9 % IV SOLN
700.0000 mg/m2 | Freq: Once | INTRAVENOUS | Status: AC
  Administered 2013-01-09: 1520 mg via INTRAVENOUS
  Filled 2013-01-09: qty 39.98

## 2013-01-09 NOTE — Progress Notes (Signed)
Pt has rash,redness,swelling in rt lower extremity. Pt is on Doxycyline for cellulits. Pt denies any fevers or worsening of symptoms. Pt states leg is very tender but that it is improving. Pt knows to call with any worsening of symptoms,fever,increased swelling or redness or any problems or concerns.

## 2013-01-09 NOTE — Patient Instructions (Signed)
[facility] Discharge Instructions for Patients Receiving Chemotherapy  Today you received the following chemotherapy agents:  Abraxane and Gemzar  To help prevent nausea and vomiting after your treatment, we encourage you to take your nausea medication as ordered per MD.   If you develop nausea and vomiting that is not controlled by your nausea medication, call the clinic.   BELOW ARE SYMPTOMS THAT SHOULD BE REPORTED IMMEDIATELY:  *FEVER GREATER THAN 100.5 F  *CHILLS WITH OR WITHOUT FEVER  NAUSEA AND VOMITING THAT IS NOT CONTROLLED WITH YOUR NAUSEA MEDICATION  *UNUSUAL SHORTNESS OF BREATH  *UNUSUAL BRUISING OR BLEEDING  TENDERNESS IN MOUTH AND THROAT WITH OR WITHOUT PRESENCE OF ULCERS  *URINARY PROBLEMS  *BOWEL PROBLEMS  UNUSUAL RASH Items with * indicate a potential emergency and should be followed up as soon as possible.  Feel free to call the clinic you have any questions or concerns. The clinic phone number is [phone].    

## 2013-01-09 NOTE — Progress Notes (Signed)
Patient reports she feels well.  She is eating well.  She denies nutrition side effects.  Her weight has increased to 227.5 pounds documented November 19.  She continues to have some bilateral leg edema, however, she feels this has improved.  Nutrition diagnosis: Weight loss improved.  Intervention: Patient educated to consume a healthy, plant-based diet with adequate protein.  She is to adjust her oral intake based on developing nutrition side effects.  Patient verbalizes understanding.  Teach back method used.  Monitoring, evaluation, goals: Patient will tolerate adequate calories and protein to promote weight maintenance.  Next visit: Wednesday, December 17th, during chemotherapy.

## 2013-01-10 ENCOUNTER — Other Ambulatory Visit: Payer: Self-pay

## 2013-01-10 DIAGNOSIS — Z1231 Encounter for screening mammogram for malignant neoplasm of breast: Secondary | ICD-10-CM

## 2013-01-14 ENCOUNTER — Telehealth: Payer: Self-pay | Admitting: *Deleted

## 2013-01-14 ENCOUNTER — Emergency Department (HOSPITAL_COMMUNITY): Payer: 59

## 2013-01-14 ENCOUNTER — Inpatient Hospital Stay (HOSPITAL_COMMUNITY)
Admission: EM | Admit: 2013-01-14 | Discharge: 2013-01-16 | DRG: 602 | Disposition: A | Discharge status: Home / Self Care | Payer: 59 | Attending: Internal Medicine | Admitting: Internal Medicine

## 2013-01-14 ENCOUNTER — Encounter (HOSPITAL_COMMUNITY): Payer: Self-pay | Admitting: Emergency Medicine

## 2013-01-14 DIAGNOSIS — M7989 Other specified soft tissue disorders: Secondary | ICD-10-CM

## 2013-01-14 DIAGNOSIS — E538 Deficiency of other specified B group vitamins: Secondary | ICD-10-CM

## 2013-01-14 DIAGNOSIS — E1142 Type 2 diabetes mellitus with diabetic polyneuropathy: Secondary | ICD-10-CM | POA: Diagnosis present

## 2013-01-14 DIAGNOSIS — Z8249 Family history of ischemic heart disease and other diseases of the circulatory system: Secondary | ICD-10-CM

## 2013-01-14 DIAGNOSIS — I10 Essential (primary) hypertension: Secondary | ICD-10-CM | POA: Diagnosis present

## 2013-01-14 DIAGNOSIS — C250 Malignant neoplasm of head of pancreas: Secondary | ICD-10-CM | POA: Diagnosis present

## 2013-01-14 DIAGNOSIS — D638 Anemia in other chronic diseases classified elsewhere: Secondary | ICD-10-CM | POA: Diagnosis present

## 2013-01-14 DIAGNOSIS — D649 Anemia, unspecified: Secondary | ICD-10-CM | POA: Diagnosis present

## 2013-01-14 DIAGNOSIS — K7689 Other specified diseases of liver: Secondary | ICD-10-CM | POA: Diagnosis present

## 2013-01-14 DIAGNOSIS — F3289 Other specified depressive episodes: Secondary | ICD-10-CM | POA: Diagnosis present

## 2013-01-14 DIAGNOSIS — L02419 Cutaneous abscess of limb, unspecified: Principal | ICD-10-CM | POA: Diagnosis present

## 2013-01-14 DIAGNOSIS — L0390 Cellulitis, unspecified: Secondary | ICD-10-CM

## 2013-01-14 DIAGNOSIS — R0989 Other specified symptoms and signs involving the circulatory and respiratory systems: Secondary | ICD-10-CM

## 2013-01-14 DIAGNOSIS — E119 Type 2 diabetes mellitus without complications: Secondary | ICD-10-CM | POA: Diagnosis present

## 2013-01-14 DIAGNOSIS — E1149 Type 2 diabetes mellitus with other diabetic neurological complication: Secondary | ICD-10-CM | POA: Diagnosis present

## 2013-01-14 DIAGNOSIS — I872 Venous insufficiency (chronic) (peripheral): Secondary | ICD-10-CM

## 2013-01-14 DIAGNOSIS — R0609 Other forms of dyspnea: Secondary | ICD-10-CM

## 2013-01-14 DIAGNOSIS — N1330 Unspecified hydronephrosis: Secondary | ICD-10-CM

## 2013-01-14 DIAGNOSIS — Z803 Family history of malignant neoplasm of breast: Secondary | ICD-10-CM

## 2013-01-14 DIAGNOSIS — D62 Acute posthemorrhagic anemia: Secondary | ICD-10-CM | POA: Diagnosis present

## 2013-01-14 DIAGNOSIS — K219 Gastro-esophageal reflux disease without esophagitis: Secondary | ICD-10-CM | POA: Diagnosis present

## 2013-01-14 DIAGNOSIS — Z7901 Long term (current) use of anticoagulants: Secondary | ICD-10-CM

## 2013-01-14 DIAGNOSIS — F329 Major depressive disorder, single episode, unspecified: Secondary | ICD-10-CM | POA: Diagnosis present

## 2013-01-14 DIAGNOSIS — Z79899 Other long term (current) drug therapy: Secondary | ICD-10-CM

## 2013-01-14 DIAGNOSIS — C259 Malignant neoplasm of pancreas, unspecified: Secondary | ICD-10-CM | POA: Diagnosis present

## 2013-01-14 DIAGNOSIS — E876 Hypokalemia: Secondary | ICD-10-CM | POA: Diagnosis present

## 2013-01-14 DIAGNOSIS — D709 Neutropenia, unspecified: Secondary | ICD-10-CM | POA: Diagnosis present

## 2013-01-14 DIAGNOSIS — N318 Other neuromuscular dysfunction of bladder: Secondary | ICD-10-CM | POA: Diagnosis present

## 2013-01-14 DIAGNOSIS — E785 Hyperlipidemia, unspecified: Secondary | ICD-10-CM | POA: Diagnosis present

## 2013-01-14 DIAGNOSIS — Z833 Family history of diabetes mellitus: Secondary | ICD-10-CM

## 2013-01-14 DIAGNOSIS — E039 Hypothyroidism, unspecified: Secondary | ICD-10-CM | POA: Diagnosis present

## 2013-01-14 DIAGNOSIS — Z807 Family history of other malignant neoplasms of lymphoid, hematopoietic and related tissues: Secondary | ICD-10-CM

## 2013-01-14 DIAGNOSIS — L03115 Cellulitis of right lower limb: Secondary | ICD-10-CM | POA: Diagnosis present

## 2013-01-14 DIAGNOSIS — R0602 Shortness of breath: Secondary | ICD-10-CM

## 2013-01-14 DIAGNOSIS — M79609 Pain in unspecified limb: Secondary | ICD-10-CM

## 2013-01-14 DIAGNOSIS — I81 Portal vein thrombosis: Secondary | ICD-10-CM | POA: Diagnosis present

## 2013-01-14 DIAGNOSIS — Z794 Long term (current) use of insulin: Secondary | ICD-10-CM

## 2013-01-14 LAB — CBC WITH DIFFERENTIAL/PLATELET
Basophils Absolute: 0 10*3/uL (ref 0.0–0.1)
Eosinophils Absolute: 0.1 10*3/uL (ref 0.0–0.7)
Eosinophils Relative: 1 % (ref 0–5)
HCT: 21.4 % — ABNORMAL LOW (ref 36.0–46.0)
Hemoglobin: 6.9 g/dL — CL (ref 12.0–15.0)
Lymphocytes Relative: 11 % — ABNORMAL LOW (ref 12–46)
Lymphs Abs: 0.6 10*3/uL — ABNORMAL LOW (ref 0.7–4.0)
MCV: 82.3 fL (ref 78.0–100.0)
Monocytes Absolute: 0.4 10*3/uL (ref 0.1–1.0)
Monocytes Relative: 7 % (ref 3–12)
RBC: 2.6 MIL/uL — ABNORMAL LOW (ref 3.87–5.11)
RDW: 19.9 % — ABNORMAL HIGH (ref 11.5–15.5)
WBC: 5.4 10*3/uL (ref 4.0–10.5)

## 2013-01-14 LAB — PROTIME-INR
INR: 1.84 — ABNORMAL HIGH (ref 0.00–1.49)
Prothrombin Time: 20.7 seconds — ABNORMAL HIGH (ref 11.6–15.2)

## 2013-01-14 LAB — HEPATIC FUNCTION PANEL
ALT: 30 U/L (ref 0–35)
AST: 30 U/L (ref 0–37)
Bilirubin, Direct: <0.1 mg/dL (ref 0.0–0.3)
Total Bilirubin: 0.3 mg/dL (ref 0.3–1.2)

## 2013-01-14 LAB — BASIC METABOLIC PANEL
CO2: 26 mEq/L (ref 19–32)
Calcium: 8.5 mg/dL (ref 8.4–10.5)
Chloride: 97 mEq/L (ref 96–112)
Creatinine, Ser: 0.59 mg/dL (ref 0.50–1.10)
GFR calc non Af Amer: 89 mL/min — ABNORMAL LOW (ref >90)
Glucose, Bld: 134 mg/dL — ABNORMAL HIGH (ref 70–99)

## 2013-01-14 LAB — PRO B NATRIURETIC PEPTIDE: Pro B Natriuretic peptide (BNP): 419.5 pg/mL — ABNORMAL HIGH (ref 0–125)

## 2013-01-14 LAB — GLUCOSE, CAPILLARY: Glucose-Capillary: 73 mg/dL (ref 70–99)

## 2013-01-14 MED ORDER — DOXAZOSIN MESYLATE 8 MG PO TABS
8.0000 mg | ORAL_TABLET | Freq: Every morning | ORAL | Status: DC
  Administered 2013-01-15: 8 mg via ORAL
  Filled 2013-01-14 (×2): qty 1

## 2013-01-14 MED ORDER — ESCITALOPRAM OXALATE 20 MG PO TABS
20.0000 mg | ORAL_TABLET | Freq: Every morning | ORAL | Status: DC
  Administered 2013-01-15: 20 mg via ORAL
  Filled 2013-01-14 (×2): qty 1

## 2013-01-14 MED ORDER — TEMAZEPAM 15 MG PO CAPS
15.0000 mg | ORAL_CAPSULE | Freq: Every evening | ORAL | Status: DC | PRN
  Administered 2013-01-14 – 2013-01-15 (×2): 15 mg via ORAL
  Filled 2013-01-14 (×2): qty 1

## 2013-01-14 MED ORDER — ACETAMINOPHEN 325 MG PO TABS
650.0000 mg | ORAL_TABLET | Freq: Once | ORAL | Status: AC
  Administered 2013-01-14: 650 mg via ORAL

## 2013-01-14 MED ORDER — FUROSEMIDE 40 MG PO TABS
40.0000 mg | ORAL_TABLET | Freq: Every morning | ORAL | Status: DC
  Administered 2013-01-15: 40 mg via ORAL
  Filled 2013-01-14 (×2): qty 1

## 2013-01-14 MED ORDER — TAB-A-VITE/IRON PO TABS
1.0000 | ORAL_TABLET | Freq: Every day | ORAL | Status: DC
  Administered 2013-01-15: 1 via ORAL
  Filled 2013-01-14 (×2): qty 1

## 2013-01-14 MED ORDER — MEGESTROL ACETATE 40 MG/ML PO SUSP
200.0000 mg | Freq: Every day | ORAL | Status: DC
  Filled 2013-01-14: qty 5

## 2013-01-14 MED ORDER — HYDROMORPHONE HCL 2 MG PO TABS
1.0000 mg | ORAL_TABLET | ORAL | Status: DC | PRN
  Administered 2013-01-15: 2 mg via ORAL
  Filled 2013-01-14: qty 1

## 2013-01-14 MED ORDER — LOSARTAN POTASSIUM 25 MG PO TABS
25.0000 mg | ORAL_TABLET | Freq: Every morning | ORAL | Status: DC
  Administered 2013-01-15: 25 mg via ORAL
  Filled 2013-01-14 (×2): qty 1

## 2013-01-14 MED ORDER — PREGABALIN 75 MG PO CAPS
150.0000 mg | ORAL_CAPSULE | Freq: Every day | ORAL | Status: DC
  Administered 2013-01-14 – 2013-01-15 (×2): 150 mg via ORAL
  Filled 2013-01-14 (×2): qty 2

## 2013-01-14 MED ORDER — FAMOTIDINE 20 MG PO TABS
20.0000 mg | ORAL_TABLET | Freq: Every day | ORAL | Status: DC
  Filled 2013-01-14 (×3): qty 1

## 2013-01-14 MED ORDER — LEVOTHYROXINE SODIUM 88 MCG PO TABS
88.0000 ug | ORAL_TABLET | Freq: Every day | ORAL | Status: DC
  Administered 2013-01-15: 88 ug via ORAL
  Filled 2013-01-14 (×3): qty 1

## 2013-01-14 MED ORDER — WARFARIN - PHARMACIST DOSING INPATIENT: Freq: Every day | Status: DC

## 2013-01-14 MED ORDER — TRAMADOL HCL 50 MG PO TABS: 50.0000 mg | ORAL_TABLET | Freq: Four times a day (QID) | ORAL | Status: DC | PRN

## 2013-01-14 MED ORDER — SIMVASTATIN 20 MG PO TABS
20.0000 mg | ORAL_TABLET | Freq: Every day | ORAL | Status: DC
  Administered 2013-01-14 – 2013-01-15 (×2): 20 mg via ORAL
  Filled 2013-01-14 (×3): qty 1

## 2013-01-14 MED ORDER — CYANOCOBALAMIN 1000 MCG/ML IJ SOLN
1000.0000 ug | INTRAMUSCULAR | Status: DC
  Filled 2013-01-14: qty 1

## 2013-01-14 MED ORDER — INSULIN GLARGINE 100 UNIT/ML ~~LOC~~ SOLN
70.0000 [IU] | Freq: Every morning | SUBCUTANEOUS | Status: DC
  Administered 2013-01-15: 70 [IU] via SUBCUTANEOUS
  Filled 2013-01-14 (×2): qty 0.7

## 2013-01-14 MED ORDER — VANCOMYCIN HCL IN DEXTROSE 1-5 GM/200ML-% IV SOLN
1000.0000 mg | Freq: Two times a day (BID) | INTRAVENOUS | Status: DC
  Filled 2013-01-14 (×2): qty 200

## 2013-01-14 MED ORDER — SODIUM CHLORIDE 0.9 % IJ SOLN
INTRAMUSCULAR | Status: AC
  Administered 2013-01-14: 18:00:00
  Filled 2013-01-14: qty 10

## 2013-01-14 MED ORDER — ACETAMINOPHEN 325 MG PO TABS: 650.0000 mg | ORAL_TABLET | Freq: Every day | ORAL | Status: DC | PRN

## 2013-01-14 MED ORDER — PROCHLORPERAZINE MALEATE 10 MG PO TABS
10.0000 mg | ORAL_TABLET | Freq: Four times a day (QID) | ORAL | Status: DC | PRN
  Filled 2013-01-14: qty 1

## 2013-01-14 MED ORDER — VANCOMYCIN HCL 10 G IV SOLR
2000.0000 mg | Freq: Once | INTRAVENOUS | Status: AC
  Administered 2013-01-15: 2000 mg via INTRAVENOUS
  Filled 2013-01-14 (×2): qty 2000

## 2013-01-14 MED ORDER — LIDOCAINE-PRILOCAINE 2.5-2.5 % EX CREA
1.0000 "application " | TOPICAL_CREAM | Freq: Once | CUTANEOUS | Status: DC
  Filled 2013-01-14: qty 5

## 2013-01-14 MED ORDER — POTASSIUM CHLORIDE 20 MEQ PO PACK
20.0000 meq | PACK | Freq: Every morning | ORAL | Status: DC
  Filled 2013-01-14: qty 1

## 2013-01-14 MED ORDER — ONDANSETRON HCL 4 MG PO TABS: 4.0000 mg | ORAL_TABLET | Freq: Three times a day (TID) | ORAL | Status: DC | PRN

## 2013-01-14 MED ORDER — WARFARIN SODIUM 7.5 MG PO TABS
15.0000 mg | ORAL_TABLET | Freq: Once | ORAL | Status: AC
  Administered 2013-01-14: 15 mg via ORAL
  Filled 2013-01-14: qty 2

## 2013-01-14 MED ORDER — MAXFE 160-1 MG PO TABS: 1.0000 | ORAL_TABLET | Freq: Every day | ORAL | Status: DC

## 2013-01-14 MED ORDER — BIOTIN 5000 MCG PO CAPS
5000.0000 ug | ORAL_CAPSULE | Freq: Every day | ORAL | Status: DC
Start: 2013-01-14 — End: 2013-01-14

## 2013-01-14 MED ORDER — ACETAMINOPHEN 325 MG PO TABS
ORAL_TABLET | ORAL | Status: AC
  Filled 2013-01-14: qty 2

## 2013-01-14 MED ORDER — METFORMIN HCL 500 MG PO TABS
500.0000 mg | ORAL_TABLET | Freq: Two times a day (BID) | ORAL | Status: DC
  Filled 2013-01-14 (×3): qty 1

## 2013-01-14 MED ORDER — CLINDAMYCIN PHOSPHATE 900 MG/50ML IV SOLN
900.0000 mg | Freq: Once | INTRAVENOUS | Status: AC
  Administered 2013-01-14: 900 mg via INTRAVENOUS
  Filled 2013-01-14 (×2): qty 50

## 2013-01-14 NOTE — ED Provider Notes (Signed)
CSN: [id number]     Arrival date & time 01/14/13  1712 History   First MD Initiated Contact with Patient 01/14/13 1715     Chief Complaint  Patient presents with  . Shortness of Breath   (Consider location/radiation/quality/duration/timing/severity/associated sxs/prior Treatment) HPI  This is a 72 year old female with history of diabetes, hypertension, and pancreatic cancer who presents with right lower extremity swelling and redness and shortness of breath. Patient reports progressive to 3 week history of right lower extremity pain, erythema, and swelling. She's been treated with Keflex and is currently on doxycycline. Patient reports worsening of her symptoms. She also during this time has had 2 venous duplex scans to rule out DVT. Patient states that over the last 3-4 days, she has had progressive dyspnea on exertion. She denies orthopnea or chest pain. She denies any fevers.  Past Medical History  Diagnosis Date  . Trigger finger   . Hyperlipidemia   . Diabetes mellitus   . Chronic venous insufficiency   . Morbid obesity   . Hypertension   . Thyroid disease   . GERD (gastroesophageal reflux disease)   . Hypothyroidism   . Arthritis   . Anemia   . Common bile duct dilatation 10/01/12  . Fatty liver 10/01/12  . Portal vein thrombosis   . Cholelithiasis   . Pancreatic lesion 10/01/12  . Hydroureteronephrosis     right  . Pancreatic mass 2014  . Cancer 10/17/12     pancreatic   . Allergy   . Anxiety   . Neuromuscular disorder     neuropathy   Past Surgical History  Procedure Laterality Date  . Lumbar laminectomy    . Breast biopsy    . Eus N/A 10/17/2012    Procedure: UPPER ENDOSCOPIC ULTRASOUND (EUS) LINEAR;  Surgeon: Rachael Fee, MD;  Location: [facility] ENDOSCOPY;  Service: Endoscopy;  Laterality: N/A;  . Portacath placement Left 11/19/2012    Procedure: INSERTION PORT-A-CATH;  Surgeon: Almond Lint, MD;  Location: [facility] ORS;  Service: General;  Laterality: Left;   Family  History  Problem Relation Age of Onset  . Heart disease Father   . Arthritis Father   . Breast cancer Mother   . Lymphoma Sister     #1  . Diabetes Sister     #1  . Diabetes Sister     #2  . Diabetes Brother     #1  . Diabetes Brother     #2  . Diabetes Other     siblings  . Hypertension Other     siblings  . Arthritis Other     siblings   History  Substance Use Topics  . Smoking status: Never Smoker   . Smokeless tobacco: Never Used  . Alcohol Use: No   OB History   Grav Para Term Preterm Abortions TAB SAB Ect Mult Living                 Review of Systems  Constitutional: Negative for fever.  Respiratory: Positive for shortness of breath. Negative for cough and chest tightness.   Cardiovascular: Positive for leg swelling. Negative for chest pain.  Gastrointestinal: Negative for nausea, vomiting and abdominal pain.  Genitourinary: Negative for dysuria.  Skin: Positive for color change.  Neurological: Negative for headaches.  All other systems reviewed and are negative.    Allergies  Oxycodone hcl; Latex; and Zithromax  Home Medications   Current Outpatient Rx  Name  Route  Sig  Dispense  Refill  .  acetaminophen (TYLENOL) 325 MG tablet   Oral   Take 650 mg by mouth daily as needed for pain. For pain         . Biotin 5000 MCG CAPS   Oral   Take 5,000 mcg by mouth at bedtime.          Marland Kitchen doxazosin (CARDURA) 8 MG tablet   Oral   Take 8 mg by mouth every morning.         Marland Kitchen doxycycline (VIBRA-TABS) 100 MG tablet   Oral   Take 1 tablet (100 mg total) by mouth 2 (two) times daily.   20 tablet   0   . escitalopram (LEXAPRO) 20 MG tablet   Oral   Take 20 mg by mouth every morning.         . famotidine (PEPCID) 20 MG tablet   Oral   Take 20 mg by mouth at bedtime.          . furosemide (LASIX) 40 MG tablet   Oral   Take 40 mg by mouth every morning.          Marland Kitchen HYDROmorphone (DILAUDID) 2 MG tablet   Oral   Take 0.5-1 tablets (1-2 mg  total) by mouth every 4 (four) hours as needed.   50 tablet   0   . Insulin Glargine (LANTUS SOLOSTAR) 100 UNIT/ML SOPN   Subcutaneous   Inject 70 Units into the skin every morning.         . Iron-FA-B12-Biotin-C-DSS-Mg-Zn (MAXFE) 160-1 MG TABS   Oral   Take 1 tablet by mouth daily.   30 tablet   2   . levothyroxine (SYNTHROID, LEVOTHROID) 88 MCG tablet   Oral   Take 88 mcg by mouth daily before breakfast. Name brand only         . lidocaine-prilocaine (EMLA) cream   Topical   Apply 1 application topically as needed. Apply 1 teaspoon to PAC site 1-2 hours prior to stick and cover with plastic wrap to numb site. Do not rub cream in.   30 g   prn   . losartan (COZAAR) 25 MG tablet   Oral   Take 25 mg by mouth every morning.         . megestrol (MEGACE) 40 MG/ML suspension   Oral   Take 200 mg by mouth daily. One teaspoon daily per patient         . metFORMIN (GLUCOPHAGE) 500 MG tablet   Oral   Take 500 mg by mouth 2 (two) times daily with a meal.          . ondansetron (ZOFRAN) 4 MG tablet      TAKE 1 TABLET BY MOUTH EVERY 8 HOURS AS NEEDED FOR NAUSEA   20 tablet   2   . Polyethyl Glycol-Propyl Glycol (SYSTANE OP)   Ophthalmic   Apply 1 drop to eye 3 (three) times daily as needed (dry eyes).         . potassium chloride (KLOR-CON) 20 MEQ packet   Oral   Take 20 mEq by mouth every morning.          . pregabalin (LYRICA) 75 MG capsule   Oral   Take 75-150 mg by mouth 2 (two) times daily. 75mg  in tje morning and 150mg  at bedtime         . prochlorperazine (COMPAZINE) 10 MG tablet   Oral   Take 1 tablet (10 mg total) by mouth every 6 (  six) hours as needed (nausea).   60 tablet   1   . simvastatin (ZOCOR) 20 MG tablet   Oral   Take 20 mg by mouth at bedtime.         . temazepam (RESTORIL) 15 MG capsule      TAKE ONE CAPSULE BY MOUTH AT BEDTIME AS NEEDED FOR SLEEP *DO NOT TAKE WITH XANAX OR TRAZADONE*   30 capsule   2     CALLED TO  PHARMACIST   . traMADol (ULTRAM) 50 MG tablet   Oral   Take 50 mg by mouth every 6 (six) hours as needed for pain.         Marland Kitchen warfarin (COUMADIN) 10 MG tablet   Oral   Take 10 mg by mouth daily. Tuesday, Thursday, Saturday 15mg . Sunday, Monday, wed., Friday 10mg .          BP 153/57  Pulse 101  Temp(Src) 101.1 F (38.4 C) (Oral)  Resp 18  SpO2 92% Physical Exam  Nursing note and vitals reviewed. Constitutional: She is oriented to person, place, and time.  Pleasant, elderly, no acute distress  HENT:  Head: Normocephalic and atraumatic.  Eyes: Pupils are equal, round, and reactive to light.  Neck: Neck supple.  Cardiovascular: Regular rhythm and normal heart sounds.   Tachycardia  Pulmonary/Chest: Effort normal and breath sounds normal. No respiratory distress. She has no wheezes. She has no rales.  Abdominal: Soft. She exhibits no distension. There is tenderness.  Musculoskeletal:  3+ right lower extremity edema with warmth and erythema to the knee, 2+ left lower extremity edema with mild erythema  Neurological: She is alert and oriented to person, place, and time.  Skin: Skin is warm and dry.  See musculoskeletal above  Psychiatric: She has a normal mood and affect.    ED Course  Procedures (including critical care time) Labs Review Labs Reviewed  CBC WITH DIFFERENTIAL - Abnormal; Notable for the following:    RBC 2.60 (*)    Hemoglobin 6.9 (*)    HCT 21.4 (*)    RDW 19.9 (*)    Neutrophils Relative % 80 (*)    Lymphocytes Relative 11 (*)    Lymphs Abs 0.6 (*)    All other components within normal limits  BASIC METABOLIC PANEL - Abnormal; Notable for the following:    Potassium 3.3 (*)    Glucose, Bld 134 (*)    GFR calc non Af Amer 89 (*)    All other components within normal limits  PRO B NATRIURETIC PEPTIDE - Abnormal; Notable for the following:    Pro B Natriuretic peptide (BNP) 419.5 (*)    All other components within normal limits  PROTIME-INR -  Abnormal; Notable for the following:    Prothrombin Time 20.7 (*)    INR 1.84 (*)    All other components within normal limits  HEPATIC FUNCTION PANEL - Abnormal; Notable for the following:    Albumin 3.0 (*)    All other components within normal limits  CBC  BASIC METABOLIC PANEL  POCT I-STAT TROPONIN I  TYPE AND SCREEN  PREPARE RBC (CROSSMATCH)  ABO/RH   Imaging Review Dg Chest 2 View  01/14/2013   CLINICAL DATA:  Shortness of breath, hypertension, diabetes  EXAM: CHEST  2 VIEW  COMPARISON:  11/19/2012  FINDINGS: Left subclavian power injectable port catheter tip at the lower SVC. Cardiomegaly with vascular and interstitial prominence, mild edema not excluded. No focal pneumonia, collapse or consolidation. No effusion  or pneumothorax. Trachea is midline. Atherosclerosis of the aorta. Diffuse degenerative changes of the spine. No compression fracture.  IMPRESSION: Cardiomegaly with vascular congestion versus early interstitial edema.   Electronically Signed   By: Ruel Favors M.D.   On: 01/14/2013 18:38    EKG Interpretation    Date/Time:  Tuesday January 14 2013 17:32:23 EST Ventricular Rate:  101 PR Interval:  154 QRS Duration: 78 QT Interval:  334 QTC Calculation: 433 R Axis:   21 Text Interpretation:  Sinus tachycardia Low voltage, precordial leads Confirmed by Angelito Hopping  MD, Makiyah Zentz ([id number]) on 01/14/2013 6:59:35 PM            MDM   4. Shortness of breath   5. Anemia   6. Cellulitis       This is a 72 year old female who presents with right lower extremity redness, swelling and warmth as well as shortness of breath. She is nontoxic-appearing on my exam.  Vital signs are notable for mild tachycardia. EKG is nonischemic. Patient has failed outpatient antibiotics for right lower extremity cellulitis. Patient will be given a dose of IV clindamycin. Regarding patient's shortness of breath, she is at risk for PE.  Other considerations include ACS, CHF, and pneumonia.  Labwork was obtained and a chest x-ray was notable for pulmonary vascular congestion. Patient has had multiple ultrasounds to rule out DVT, however, INR subtherapeutic. Ultrasound was obtained and is negative for DVT. Lab work is notable for anemia with a hemoglobin of 6.9.  I discussed the patient with the on-call oncologist Dr. Delight Ovens.  She agrees with transfusion the patient 2 units of blood. Anemia may be secondary to chemotherapy. Hemoccult was also obtained. She also agrees with admission for IV antibiotics for failed outpatient treatment of cellulitis.  Shortness of breath is likely multifactorial, a result of anemia and vascular congestion  I discussed this with Dr. Lyda Perone.    Prior to blood transfusion, patient was noted to have a temperature of 101.1. She was given Tylenol. Blood cultures were drawn.    Shon Baton, MD 01/14/13 2201

## 2013-01-14 NOTE — Progress Notes (Signed)
ANTIBIOTIC CONSULT NOTE - INITIAL  Pharmacy Consult for Vancomycin Indication: Cellulitis of RLE  Allergies  Allergen Reactions  . Oxycodone Hcl Nausea Only  . Latex Rash  . Zithromax [Azithromycin] Other (See Comments)    thrush    Patient Measurements: Height: 5' 6.53" (169 cm) (documented 01/08/13) Weight: 227 lb 1.2 oz (103 kg) (Documented 01/08/13) IBW/kg (Calculated) : 60.53 Adjusted Body Weight:   Vital Signs: Temp: 101 F (38.3 C) (11/25 2145) Temp src: Oral (11/25 2145) BP: 151/62 mmHg (11/25 2145) Pulse Rate: 92 (11/25 2145) Intake/Output from previous day:   Intake/Output from this shift: Total I/O In: 12.5 [Blood:12.5] Out: -   Labs:  Recent Labs  01/14/13 1809  WBC 5.4  HGB 6.9*  PLT 220  CREATININE 0.59   Estimated Creatinine Clearance: 77.8 ml/min (by C-G formula based on Cr of 0.59). No results found for this basename: Rolm Gala, VANCORANDOM, GENTTROUGH, GENTPEAK, GENTRANDOM, TOBRATROUGH, TOBRAPEAK, TOBRARND, AMIKACINPEAK, AMIKACINTROU, AMIKACIN,  in the last 72 hours   Microbiology: Recent Results (from the past 720 hour(s))  TECHNOLOGIST REVIEW     Status: None   Collection Time    12/19/12 11:19 AM      Result Value Range Status   Technologist Review Rare Metas and Myelocytes present   Final  TECHNOLOGIST REVIEW     Status: None   Collection Time    12/27/12 11:16 AM      Result Value Range Status   Technologist Review Few Hypersegs, Rare myelocyte on scan   Final  TECHNOLOGIST REVIEW     Status: None   Collection Time    01/02/13  8:17 AM      Result Value Range Status   Technologist Review     Final   Value: few schistocytes and helmet cells, Metas and Myelocytes present  TECHNOLOGIST REVIEW     Status: None   Collection Time    01/08/13  8:49 AM      Result Value Range Status   Technologist Review     Final   Value: rare meta, moderate ovalos, few teardrops and shistocytes    Medical History: Past Medical  History  Diagnosis Date  . Trigger finger   . Hyperlipidemia   . Diabetes mellitus   . Chronic venous insufficiency   . Morbid obesity   . Hypertension   . Thyroid disease   . GERD (gastroesophageal reflux disease)   . Hypothyroidism   . Arthritis   . Anemia   . Common bile duct dilatation 10/01/12  . Fatty liver 10/01/12  . Portal vein thrombosis   . Cholelithiasis   . Pancreatic lesion 10/01/12  . Hydroureteronephrosis     right  . Pancreatic mass 2014  . Cancer 10/17/12     pancreatic   . Allergy   . Anxiety   . Neuromuscular disorder     neuropathy    Medications:  Scheduled:  . acetaminophen      . cyanocobalamin  1,000 mcg Intramuscular Q30 days  . cyanocobalamin  1,000 mcg Intramuscular Q30 days  . [START ON 01/15/2013] doxazosin  8 mg Oral q morning - 10a  . [START ON 01/15/2013] escitalopram  20 mg Oral q morning - 10a  . famotidine  20 mg Oral QHS  . [START ON 01/15/2013] furosemide  40 mg Oral q morning - 10a  . [START ON 01/15/2013] Insulin Glargine  70 Units Subcutaneous q morning - 10a  . [START ON 01/15/2013] levothyroxine  88 mcg Oral QAC  breakfast  . lidocaine-prilocaine  1 application Topical Once  . [START ON 01/15/2013] losartan  25 mg Oral q morning - 10a  . MAXFE  1 tablet Oral Daily  . [START ON 01/15/2013] megestrol  200 mg Oral Daily  . [START ON 01/15/2013] metFORMIN  500 mg Oral BID WC  . [START ON 01/15/2013] potassium chloride  20 mEq Oral q morning - 10a  . pregabalin  150 mg Oral QHS  . simvastatin  20 mg Oral QHS  . warfarin  15 mg Oral Once  . [START ON 01/15/2013] Warfarin - Pharmacist Dosing Inpatient   Does not apply q1800   Infusions:   PRN: acetaminophen, HYDROmorphone, ondansetron, prochlorperazine, temazepam, traMADol Assessment: 72 yo F with 3-4 history of SOB, these symptoms have gotten progressively worse. Has developed BLE swelling and RLE erythema and tenderness.  Goal of Therapy:  Vancomycin trough level 10-15  mcg/ml  Plan:  Begin with a loading dose of Vancomycin 2000mg  x1. Vancomycin 1000mg  IV q 12 hours. Measure antibiotic drug levels at steady state Follow up culture results  Loletta Specter 01/14/2013,10:22 PM

## 2013-01-14 NOTE — ED Notes (Signed)
Per patient, started having B/L lower extremity swelling for 4 weeks-started having SOB 3-4 days ago-progressively gotten worse

## 2013-01-14 NOTE — Progress Notes (Signed)

## 2013-01-14 NOTE — Telephone Encounter (Signed)
Confirmed with MD she needs to go to the emergency room. Called patient with instructions to go to emergency room now. She agrees to do so.

## 2013-01-14 NOTE — Progress Notes (Signed)
ANTICOAGULATION CONSULT NOTE - Initial Consult  Pharmacy Consult for Warfarin Indication: VTE treatment  Allergies  Allergen Reactions  . Oxycodone Hcl Nausea Only  . Latex Rash  . Zithromax [Azithromycin] Other (See Comments)    thrush    Patient Measurements: Height: 5' 6.53" (169 cm) (documented 01/08/13) Weight: 227 lb 1.2 oz (103 kg) (Documented 01/08/13) IBW/kg (Calculated) : 60.53 Heparin Dosing Weight:   Vital Signs: Temp: 101 F (38.3 C) (11/25 2145) Temp src: Oral (11/25 2145) BP: 151/62 mmHg (11/25 2145) Pulse Rate: 92 (11/25 2145)  Labs:  Recent Labs  01/14/13 1809  HGB 6.9*  HCT 21.4*  PLT 220  LABPROT 20.7*  INR 1.84*  CREATININE 0.59    Estimated Creatinine Clearance: 77.8 ml/min (by C-G formula based on Cr of 0.59).   Medical History: Past Medical History  Diagnosis Date  . Trigger finger   . Hyperlipidemia   . Diabetes mellitus   . Chronic venous insufficiency   . Morbid obesity   . Hypertension   . Thyroid disease   . GERD (gastroesophageal reflux disease)   . Hypothyroidism   . Arthritis   . Anemia   . Common bile duct dilatation 10/01/12  . Fatty liver 10/01/12  . Portal vein thrombosis   . Cholelithiasis   . Pancreatic lesion 10/01/12  . Hydroureteronephrosis     right  . Pancreatic mass 2014  . Cancer 10/17/12     pancreatic   . Allergy   . Anxiety   . Neuromuscular disorder     neuropathy    Medications:  Scheduled:  . acetaminophen      . cyanocobalamin  1,000 mcg Intramuscular Q30 days  . cyanocobalamin  1,000 mcg Intramuscular Q30 days  . [START ON 01/15/2013] doxazosin  8 mg Oral q morning - 10a  . [START ON 01/15/2013] escitalopram  20 mg Oral q morning - 10a  . famotidine  20 mg Oral QHS  . [START ON 01/15/2013] furosemide  40 mg Oral q morning - 10a  . [START ON 01/15/2013] Insulin Glargine  70 Units Subcutaneous q morning - 10a  . [START ON 01/15/2013] levothyroxine  88 mcg Oral QAC breakfast  .  lidocaine-prilocaine  1 application Topical Once  . [START ON 01/15/2013] losartan  25 mg Oral q morning - 10a  . MAXFE  1 tablet Oral Daily  . [START ON 01/15/2013] megestrol  200 mg Oral Daily  . [START ON 01/15/2013] metFORMIN  500 mg Oral BID WC  . [START ON 01/15/2013] potassium chloride  20 mEq Oral q morning - 10a  . pregabalin  150 mg Oral QHS  . simvastatin  20 mg Oral QHS   Infusions:   PRN: acetaminophen, HYDROmorphone, ondansetron, prochlorperazine, temazepam, traMADol  Assessment: 72 yo F with hx of portal vein thrombosis. Has been taking 10mg  Sun/Mon/Wed/Fri and 15mg  TuThSat Has been receiving chemo for pancreatic CA Goal of Therapy:  INR=2-3 Monitoring for platelets   Plan:  Will resume on patient's dose of 15mg  x1 tonight Follow INR in AM. Coumadin book/video  Loletta Specter 01/14/2013,10:13 PM

## 2013-01-14 NOTE — ED Notes (Addendum)
Critical Hgb 6.9.  Made Primary RN Ajsa aware.

## 2013-01-14 NOTE — H&P (Addendum)
[facility] History and Physical  Theresa Summers ZOX:[id number] DOB: Sep 23, 1940 DOA: 01/14/2013  Referring physician: ED PCP: Ruthe Mannan, MD   Chief Complaint: SOB  HPI: Theresa Summers is a 72 y.o. female who presents to the ED with c/o 3-4 day history of SOB.  Symptoms progressively worsening.  No orthopnea, no chest pain, no fevers (subjectively, objectively does have Tm in ED of 101.1).  Her symptoms occur in the context of ongoing chemo treatment for pancreatic cancer with a regimine including gemcytobine.  She has also developed BLE swelling as well as RLE erythema, and tenderness.  This has been being treated with ABx as an out patient for presumed cellulitis vs pseudo cellulitis secondary to gemcytobine.Marland Kitchen  Despite a h/o portal vein thrombosis, the patient is on coumadin, and 2 BLE venous duplexes (and a 3rd one tonight) have all been negative for LE DVT.  Review of Systems: 12 systems reviewed and otherwise negative.  Past Medical History  Diagnosis Date  . Trigger finger   . Hyperlipidemia   . Diabetes mellitus   . Chronic venous insufficiency   . Morbid obesity   . Hypertension   . Thyroid disease   . GERD (gastroesophageal reflux disease)   . Hypothyroidism   . Arthritis   . Anemia   . Common bile duct dilatation 10/01/12  . Fatty liver 10/01/12  . Portal vein thrombosis   . Cholelithiasis   . Pancreatic lesion 10/01/12  . Hydroureteronephrosis     right  . Pancreatic mass 2014  . Cancer 10/17/12     pancreatic   . Allergy   . Anxiety   . Neuromuscular disorder     neuropathy   Past Surgical History  Procedure Laterality Date  . Lumbar laminectomy    . Breast biopsy    . Eus N/A 10/17/2012    Procedure: UPPER ENDOSCOPIC ULTRASOUND (EUS) LINEAR;  Surgeon: Rachael Fee, MD;  Location: [facility];  Service: Endoscopy;  Laterality: N/A;  . Portacath placement Left 11/19/2012    Procedure: INSERTION PORT-A-CATH;  Surgeon: Almond Lint, MD;  Location: [facility];   Service: General;  Laterality: Left;   Social History:  reports that she has never smoked. She has never used smokeless tobacco. She reports that she does not drink alcohol or use illicit drugs.   Allergies  Allergen Reactions  . Oxycodone Hcl Nausea Only  . Latex Rash  . Zithromax [Azithromycin] Other (See Comments)    thrush    Family History  Problem Relation Age of Onset  . Heart disease Father   . Arthritis Father   . Breast cancer Mother   . Lymphoma Sister     #1  . Diabetes Sister     #1  . Diabetes Sister     #2  . Diabetes Brother     #1  . Diabetes Brother     #2  . Diabetes Other     siblings  . Hypertension Other     siblings  . Arthritis Other     siblings    Prior to Admission medications   Medication Sig Start Date End Date Taking? Authorizing Provider  acetaminophen (TYLENOL) 325 MG tablet Take 650 mg by mouth daily as needed for pain. For pain   Yes Historical Provider, MD  Biotin 5000 MCG CAPS Take 5,000 mcg by mouth at bedtime.    Yes Historical Provider, MD  doxazosin (CARDURA) 8 MG tablet Take 8 mg by mouth every morning.  Yes Historical Provider, MD  doxycycline (VIBRA-TABS) 100 MG tablet Take 1 tablet (100 mg total) by mouth 2 (two) times daily. 01/08/13  Yes Rana Snare, NP  escitalopram (LEXAPRO) 20 MG tablet Take 20 mg by mouth every morning.   Yes Historical Provider, MD  famotidine (PEPCID) 20 MG tablet Take 20 mg by mouth at bedtime.    Yes Historical Provider, MD  furosemide (LASIX) 40 MG tablet Take 40 mg by mouth every morning.    Yes Historical Provider, MD  HYDROmorphone (DILAUDID) 2 MG tablet Take 0.5-1 tablets (1-2 mg total) by mouth every 4 (four) hours as needed. 12/27/12  Yes Rana Snare, NP  Insulin Glargine (LANTUS SOLOSTAR) 100 UNIT/ML SOPN Inject 70 Units into the skin every morning.   Yes Historical Provider, MD  Iron-FA-B12-Biotin-C-DSS-Mg-Zn (MAXFE) 160-1 MG TABS Take 1 tablet by mouth daily. 11/12/12  Yes Hart Carwin,  MD  levothyroxine (SYNTHROID, LEVOTHROID) 88 MCG tablet Take 88 mcg by mouth daily before breakfast. Name brand only   Yes Historical Provider, MD  lidocaine-prilocaine (EMLA) cream Apply 1 application topically as needed. Apply 1 teaspoon to PAC site 1-2 hours prior to stick and cover with plastic wrap to numb site. Do not rub cream in. 11/20/12  Yes Ladene Artist, MD  losartan (COZAAR) 25 MG tablet Take 25 mg by mouth every morning.   Yes Historical Provider, MD  megestrol (MEGACE) 40 MG/ML suspension Take 200 mg by mouth daily. One teaspoon daily per patient 11/12/12  Yes Hart Carwin, MD  metFORMIN (GLUCOPHAGE) 500 MG tablet Take 500 mg by mouth 2 (two) times daily with a meal.    Yes Historical Provider, MD  ondansetron (ZOFRAN) 4 MG tablet TAKE 1 TABLET BY MOUTH EVERY 8 HOURS AS NEEDED FOR NAUSEA 12/27/12  Yes Rana Snare, NP  Polyethyl Glycol-Propyl Glycol (SYSTANE OP) Apply 1 drop to eye 3 (three) times daily as needed (dry eyes).   Yes Historical Provider, MD  potassium chloride (KLOR-CON) 20 MEQ packet Take 20 mEq by mouth every morning.    Yes Historical Provider, MD  pregabalin (LYRICA) 75 MG capsule Take 75-150 mg by mouth 2 (two) times daily. 75mg  in tje morning and 150mg  at bedtime   Yes Historical Provider, MD  prochlorperazine (COMPAZINE) 10 MG tablet Take 1 tablet (10 mg total) by mouth every 6 (six) hours as needed (nausea). 11/20/12  Yes Ladene Artist, MD  simvastatin (ZOCOR) 20 MG tablet Take 20 mg by mouth at bedtime.   Yes Historical Provider, MD  temazepam (RESTORIL) 15 MG capsule TAKE ONE CAPSULE BY MOUTH AT BEDTIME AS NEEDED FOR SLEEP *DO NOT TAKE WITH XANAX OR TRAZADONE* 12/23/12  Yes Ladene Artist, MD  traMADol (ULTRAM) 50 MG tablet Take 50 mg by mouth every 6 (six) hours as needed for pain.   Yes Historical Provider, MD  warfarin (COUMADIN) 10 MG tablet Take 10 mg by mouth daily. Tuesday, Thursday, Saturday 15mg . Sunday, Monday, wed., Friday 10mg .   Yes Historical  Provider, MD   Physical Exam: Filed Vitals:   01/14/13 2124  BP: 153/57  Pulse: 101  Temp: 101.1 F (38.4 C)  Resp:     General:  NAD, resting comfortably in bed Eyes: PEERLA EOMI ENT: mucous membranes moist Neck: supple w/o JVD Cardiovascular: RRR w/o MRG Respiratory: CTA B Abdomen: soft, nt, nd, bs+ Skin: erythema and tenderness LLE, BLE edema Musculoskeletal: MAE, full ROM all 4 extremities Psychiatric: normal tone and affect Neurologic: AAOx3,  grossly non-focal  Labs on Admission:  Basic Metabolic Panel:  Recent Labs Lab 01/14/13 1809  NA 135  K 3.3*  CL 97  CO2 26  GLUCOSE 134*  BUN 9  CREATININE 0.59  CALCIUM 8.5   Liver Function Tests:  Recent Labs Lab 01/14/13 1809  AST 30  ALT 30  ALKPHOS 50  BILITOT 0.3  PROT 6.1  ALBUMIN 3.0*   No results found for this basename: LIPASE, AMYLASE,  in the last 168 hours No results found for this basename: AMMONIA,  in the last 168 hours CBC:  Recent Labs Lab 01/08/13 0849 01/14/13 1809  WBC 6.1 5.4  NEUTROABS 4.2 4.3  HGB 7.8* 6.9*  HCT 24.7* 21.4*  MCV 83.2 82.3  PLT 286 220   Cardiac Enzymes: No results found for this basename: CKTOTAL, CKMB, CKMBINDEX, TROPONINI,  in the last 168 hours  BNP (last 3 results)  Recent Labs  01/14/13 1809  PROBNP 419.5*   CBG: No results found for this basename: GLUCAP,  in the last 168 hours  Radiological Exams on Admission: Dg Chest 2 View  01/14/2013   CLINICAL DATA:  Shortness of breath, hypertension, diabetes  EXAM: CHEST  2 VIEW  COMPARISON:  11/19/2012  FINDINGS: Left subclavian power injectable port catheter tip at the lower SVC. Cardiomegaly with vascular and interstitial prominence, mild edema not excluded. No focal pneumonia, collapse or consolidation. No effusion or pneumothorax. Trachea is midline. Atherosclerosis of the aorta. Diffuse degenerative changes of the spine. No compression fracture.  IMPRESSION: Cardiomegaly with vascular congestion  versus early interstitial edema.   Electronically Signed   By: Ruel Favors M.D.   On: 01/14/2013 18:38    EKG: Independently reviewed.  Assessment/Plan Principal Problem:   Anemia Active Problems:   Portal vein thrombosis   Pancreatic cancer   1. Shortness of breath - DDx includes: 1. Symptomatic anemia - HGB of 6.9, will transfuse 2 units PRBC at this time 2. CHF - patient without PMH of CHF, but does have suggestion of fluid overload on CXR, if SOB becomes worse with transfusion, then will need to put patient on lasix. 2. LLE cellulitis - vs pseudocellulitis from gemcytobine, will treat as cellulitis at this time, MRSA coverage with vancomycin ordered. 3. DM2 - continue home lantus and metformin for now, CBG checks AC/HS    Code Status: Full Code (must indicate code status--if unknown or must be presumed, indicate so) Family Communication: Family at bedside (indicate person spoken with, if applicable, with phone number if by telephone) Disposition Plan: Admit to inpatient (indicate anticipated LOS)  Time spent: 70 min  GARDNER, JARED M. [facility] Pager [phone]  If 7PM-7AM, please contact night-coverage [email or web address] Password [facility] 01/14/2013, 9:50 PM

## 2013-01-14 NOTE — ED Notes (Signed)
Dr Wilkie Aye notified of critical results: hemoglobin 6.9

## 2013-01-14 NOTE — Telephone Encounter (Signed)
Left VM asking to be called regarding increase in right leg tenderness and swelling. Also reported shortness of breath and "extreme tiredness".

## 2013-01-14 NOTE — Progress Notes (Signed)
Report called to Sara, RN.

## 2013-01-14 NOTE — Progress Notes (Signed)
VASCULAR LAB PRELIMINARY  PRELIMINARY  PRELIMINARY  PRELIMINARY  Bilateral lower extremity venous duplex completed.    Preliminary report:  Bilateral:  No evidence of DVT, superficial thrombosis, or Baker's Cyst. No change since study of 01/08/2013   Burrell Hodapp, RVS 01/14/2013, 8:56 PM

## 2013-01-14 NOTE — Telephone Encounter (Signed)
Called patient back and she reports her right leg is more swollen and painful and red than it was last week. Says it is hot to touch also. Is short winded and very fatigued. RN inquired if she had chest pain-she denies. Asked if she had fever and she said she did not know. When RN asked if she could check her temp she said she was not where she could check it at the present time. Determined after more questioning that she was at the [facility] in [place] just in case Dr. Truett Perna could see her this afternoon.  Informed her the safest way to evaluate her with these symptoms would be the [facility] emergency room, but nurse will check and confirm this with MD.

## 2013-01-15 DIAGNOSIS — D709 Neutropenia, unspecified: Secondary | ICD-10-CM | POA: Diagnosis present

## 2013-01-15 DIAGNOSIS — C250 Malignant neoplasm of head of pancreas: Secondary | ICD-10-CM

## 2013-01-15 DIAGNOSIS — D62 Acute posthemorrhagic anemia: Secondary | ICD-10-CM | POA: Diagnosis present

## 2013-01-15 DIAGNOSIS — D638 Anemia in other chronic diseases classified elsewhere: Secondary | ICD-10-CM

## 2013-01-15 DIAGNOSIS — L02419 Cutaneous abscess of limb, unspecified: Principal | ICD-10-CM

## 2013-01-15 DIAGNOSIS — L03115 Cellulitis of right lower limb: Secondary | ICD-10-CM | POA: Diagnosis present

## 2013-01-15 LAB — CBC
Hemoglobin: 8.1 g/dL — ABNORMAL LOW (ref 12.0–15.0)
MCHC: 32.8 g/dL (ref 30.0–36.0)
RBC: 3.02 MIL/uL — ABNORMAL LOW (ref 3.87–5.11)
RDW: 18.4 % — ABNORMAL HIGH (ref 11.5–15.5)

## 2013-01-15 LAB — BASIC METABOLIC PANEL
GFR calc Af Amer: >90 mL/min (ref >90)
GFR calc non Af Amer: >90 mL/min (ref >90)
Glucose, Bld: 101 mg/dL — ABNORMAL HIGH (ref 70–99)
Potassium: 3 mEq/L — ABNORMAL LOW (ref 3.5–5.1)
Sodium: 141 mEq/L (ref 135–145)

## 2013-01-15 LAB — GLUCOSE, CAPILLARY: Glucose-Capillary: 130 mg/dL — ABNORMAL HIGH (ref 70–99)

## 2013-01-15 LAB — OCCULT BLOOD X 1 CARD TO LAB, STOOL: Fecal Occult Bld: NEGATIVE

## 2013-01-15 LAB — HEMOGLOBIN A1C
Hgb A1c MFr Bld: 6.9 % — ABNORMAL HIGH (ref <5.7)
Mean Plasma Glucose: 151 mg/dL — ABNORMAL HIGH (ref <117)

## 2013-01-15 MED ORDER — FUROSEMIDE 10 MG/ML IJ SOLN
20.0000 mg | Freq: Once | INTRAMUSCULAR | Status: AC
  Administered 2013-01-15: 20 mg via INTRAVENOUS
  Filled 2013-01-15: qty 2

## 2013-01-15 MED ORDER — POTASSIUM CHLORIDE CRYS ER 20 MEQ PO TBCR
40.0000 meq | EXTENDED_RELEASE_TABLET | Freq: Every day | ORAL | Status: DC
  Administered 2013-01-15: 40 meq via ORAL
  Filled 2013-01-15 (×2): qty 2

## 2013-01-15 MED ORDER — WARFARIN SODIUM 2.5 MG PO TABS
12.5000 mg | ORAL_TABLET | Freq: Once | ORAL | Status: AC
  Administered 2013-01-15: 18:00:00 12.5 mg via ORAL
  Filled 2013-01-15: qty 1

## 2013-01-15 MED ORDER — ENSURE COMPLETE PO LIQD
237.0000 mL | Freq: Every day | ORAL | Status: DC
  Administered 2013-01-15: 237 mL via ORAL

## 2013-01-15 MED ORDER — VANCOMYCIN HCL IN DEXTROSE 1-5 GM/200ML-% IV SOLN
1000.0000 mg | Freq: Two times a day (BID) | INTRAVENOUS | Status: DC
  Administered 2013-01-15 – 2013-01-16 (×2): 1000 mg via INTRAVENOUS
  Filled 2013-01-15 (×2): qty 200

## 2013-01-15 MED ORDER — POTASSIUM CHLORIDE CRYS ER 20 MEQ PO TBCR
20.0000 meq | EXTENDED_RELEASE_TABLET | Freq: Every day | ORAL | Status: DC
  Filled 2013-01-15: qty 1

## 2013-01-15 NOTE — Progress Notes (Signed)
ANTICOAGULATION CONSULT NOTE - Follow Up Consult  Pharmacy Consult for Warfarin  Indication: Portal Vein Thrombosis   Allergies  Allergen Reactions  . Oxycodone Hcl Nausea Only  . Latex Rash  . Zithromax [Azithromycin] Other (See Comments)    thrush    Patient Measurements: Height: 5' 6.53" (169 cm) (documented 01/08/13) Weight: 227 lb 1.2 oz (103 kg) (Documented 01/08/13) IBW/kg (Calculated) : 60.53   Vital Signs: Temp: 98.2 F (36.8 C) (11/26 0515) Temp src: Oral (11/26 0515) BP: 130/52 mmHg (11/26 0515) Pulse Rate: 76 (11/26 0515)  Labs:  Recent Labs  01/14/13 1809 01/15/13 0549  HGB 6.9* 8.1*  HCT 21.4* 24.7*  PLT 220 212  LABPROT 20.7* 21.6*  INR 1.84* 1.94*  CREATININE 0.59 0.52    Estimated Creatinine Clearance: 77.8 ml/min (by C-G formula based on Cr of 0.52).   Medications:  Scheduled:  . cyanocobalamin  1,000 mcg Intramuscular Q30 days  . doxazosin  8 mg Oral q morning - 10a  . escitalopram  20 mg Oral q morning - 10a  . famotidine  20 mg Oral QHS  . furosemide  40 mg Oral q morning - 10a  . insulin glargine  70 Units Subcutaneous q morning - 10a  . levothyroxine  88 mcg Oral QAC breakfast  . lidocaine-prilocaine  1 application Topical Once  . losartan  25 mg Oral q morning - 10a  . multivitamins with iron  1 tablet Oral Daily  . potassium chloride  40 mEq Oral Daily  . pregabalin  150 mg Oral QHS  . simvastatin  20 mg Oral QHS  . vancomycin  1,000 mg Intravenous Q12H  . Warfarin - Pharmacist Dosing Inpatient   Does not apply q1800   Infusions:   PRN: acetaminophen, HYDROmorphone, ondansetron, prochlorperazine, temazepam, traMADol  Assessment:  72 yo F with a history of pancreatic cancer, on chronic anticoagulation with warfarin for protal vein thrombosis   Warfarin dose prior to admission 15 mg on Tues/Thurs/Sat; 10 mg on Sun/Mon/Wed/Fri; Last dose prior to admission reported taken on 11/24  INR on admission was subtherapeutic at 1.84,  today has trended up to 1.94 today.  Near INR goal on home dosing  Hgb on admission low 6.9 - thought to be related to anemia of chronic disease, transfused 2 units PRBC yesterday and Hgb today is stable at 8.1.  No bleeding noted.  Plt okay    Goal of Therapy:  INR 2-3 Monitor platelets by anticoagulation protocol: Yes   Plan:  1.)  Patient usually takes 10 mg on wednesdays, INR slightly below goal, will give 12.5 mg tonight 2.) Daily PT/INR   Edilia Ghuman, Loma Messing PharmD Pager #: [phone] 10:36 AM 01/15/2013

## 2013-01-15 NOTE — Progress Notes (Addendum)
[facility] PROGRESS NOTE  Theresa Summers MWU:[id number] DOB: 1940-09-03 DOA: 01/14/2013 PCP: Ruthe Mannan, MD  Brief narrative: 72 year old female with past medical history of pancreatic adenocarcinoma (under Dr. Truett Perna' care), diabetes, portal vein thrombosis on Coumadin who presented to [facility] ED 01/14/2013 with worsening shortness of breath for a few days prior to this admission. There was no associated fevers or chest pain. Patient also noticed to have right lower extremity more swollen and red which has been treated with antibiotics outpatient with no significant improvement. Lower extremity Doppler done on the admission did not reveal DVT.  Assessment/Plan:  Principal Problem:   Acute blood loss anemia - unclear etiology; perhaps related to history of pancreatic adenocarcinoma - hemoglobin on admission 6.9 and status post 2 units of PRBC transfusion hemoglobin is 8.1 - Followup CBC in the morning  Active Problems:   Portal vein thrombosis - Continue Coumadin - INR 1.94 this am   Pancreatic adenocarcinoma - per oncology; will inform Dr. Truett Perna of patient's admission    Right lower extremity cellulitis - Patient was given clindamycin on the admission but then was started on vancomycin which she is now taking Q 12 hours   Hypokalemia - being repleted with potassium chloride 40 meq daily   Neutropenia - likely sequela of chemotherapy - WBC count 3.3 this am - no fevers   Hypothyroidism - Continue Synthroid 88 mcg daily   Diabetes with complications of neuropathy, no mention if uncontrolled  - Hold metformin due to RLE cellulitis - CBG's in past 24 hours: 73, 96 - only on sliding scale insulin  - check A1c   Diabetic neuropathy - continue lyrica   Overactive bladder - continue cardura   Hypertension - on losartan, cardura   Dyslipidemia - continue statin therapy    Depression - on lexapro  Code Status: full code Family Communication: no family at the bedside   Disposition Plan: home when stable; likely in am  Manson Passey, MD  [facility] Pager [phone]  If 7PM-7AM, please contact night-coverage [email or web address] Password [facility] 01/15/2013, 7:59 AM   LOS: 1 day   Consultants:  None   Procedures:  LE doppler - negative for B/L DVT  Antibiotics:  Clindamycin given on admission only  Vancomycin 01/14/2013 -->  HPI/Subjective: Feels better this am.   Objective: Filed Vitals:   01/15/13 0133 01/15/13 0237 01/15/13 0345 01/15/13 0515  BP: 122/53 130/54 138/57 130/52  Pulse: 77 76 90 76  Temp: 98.2 F (36.8 C) 98.3 F (36.8 C) 98.2 F (36.8 C) 98.2 F (36.8 C)  TempSrc: Oral Oral Oral Oral  Resp: 16 16 18 20   Height:      Weight:      SpO2: 95% 97% 96% 97%    Intake/Output Summary (Last 24 hours) at 01/15/13 0759 Last data filed at 01/15/13 0429  Gross per 24 hour  Intake   1235 ml  Output      0 ml  Net   1235 ml    Exam:   General:  Pt is alert, follows commands appropriately, not in acute distress  Cardiovascular: Regular rate and rhythm, S1/S2, no murmurs, no rubs, no gallops  Respiratory: Clear to auscultation bilaterally, no wheezing, no crackles, no rhonchi  Abdomen: Soft, non tender, non distended, bowel sounds present, no guarding  Extremities:Right lower extremity extensive erythema, cellulitis, tenderness; LLE slight edema but no cellulitis, pulses DP and PT palpable bilaterally  Neuro: Grossly nonfocal  Data Reviewed: Basic Metabolic Panel:  Recent Labs  Lab 01/14/13 1809 01/15/13 0549  NA 135 141  K 3.3* 3.0*  CL 97 104  CO2 26 28  GLUCOSE 134* 101*  BUN 9 7  CREATININE 0.59 0.52  CALCIUM 8.5 8.5   Liver Function Tests:  Recent Labs Lab 01/14/13 1809  AST 30  ALT 30  ALKPHOS 50  BILITOT 0.3  PROT 6.1  ALBUMIN 3.0*   No results found for this basename: LIPASE, AMYLASE,  in the last 168 hours No results found for this basename: AMMONIA,  in the last 168  hours CBC:  Recent Labs Lab 01/08/13 0849 01/14/13 1809 01/15/13 0549  WBC 6.1 5.4 3.3*  NEUTROABS 4.2 4.3  --   HGB 7.8* 6.9* 8.1*  HCT 24.7* 21.4* 24.7*  MCV 83.2 82.3 81.8  PLT 286 220 212   Cardiac Enzymes: No results found for this basename: CKTOTAL, CKMB, CKMBINDEX, TROPONINI,  in the last 168 hours BNP: No components found with this basename: POCBNP,  CBG:  Recent Labs Lab 01/14/13 2241  GLUCAP 73    Recent Results (from the past 240 hour(s))  TECHNOLOGIST REVIEW     Status: None   Collection Time    01/08/13  8:49 AM      Result Value Range Status   Technologist Review     Final   Value: rare meta, moderate ovalos, few teardrops and shistocytes     Studies: Dg Chest 2 View 01/14/2013   IMPRESSION: Cardiomegaly with vascular congestion versus early interstitial edema.   Electronically Signed   By: Ruel Favors M.D.   On: 01/14/2013 18:38    Scheduled Meds: . cyanocobalamin  1,000 mcg Intramuscular Q30 days  . doxazosin  8 mg Oral q morning - 10a  . escitalopram  20 mg Oral q morning - 10a  . famotidine  20 mg Oral QHS  . furosemide  40 mg Oral q morning - 10a  . insulin glargine  70 Units Subcutaneous q morning - 10a  . levothyroxine  88 mcg Oral QAC breakfast  . losartan  25 mg Oral q morning - 10a  . megestrol  200 mg Oral Daily  . metFORMIN  500 mg Oral BID WC  . multivitamins with iron  1 tablet Oral Daily  . potassium chloride  40 mEq Oral Daily  . pregabalin  150 mg Oral QHS  . simvastatin  20 mg Oral QHS  . vancomycin  1,000 mg Intravenous Q12H  . Warfarin    Does not apply q1800

## 2013-01-15 NOTE — Progress Notes (Signed)
Came to see patient at bedside on behalf of [employer] program for [employer] employees/dependents with [employer]. She has been active with the Pharmacist with [facility] for her DM management. However, she reports due to her illness and treatments, her follow up with [facility] Pharmacist at the outpatient pharmacy has been placed on hold. Discussed supplements that the Link to [facility] Care Management office has at a discounted price, such as ensure and glucerna. She reports she drinks ensure plu . She reports she would be interested in obtaining supplement from [facility] Care Management/ [employer] [facility] office. Gave contact information to Mrs Askren and confirmed her contact information for this writer to call post hospital discharge. Appreciative of visit. Raiford Noble, MSN-Ed, RN,BSN- [facility] [phone]

## 2013-01-15 NOTE — Care Management Note (Signed)
   CARE MANAGEMENT NOTE 01/15/2013  Patient:  SHAWNELLE, SPOERL   Account Number:  [id number]  Date Initiated:  01/15/2013  Documentation initiated by:  Dorothee Napierkowski  Subjective/Objective Assessment:   72 yo female admitted with acute blood loss anemia. PCP: Ruthe Mannan, MD     Action/Plan:   Home when stable   Anticipated DC Date:  01/16/2013   Anticipated DC Plan:  HOME/SELF CARE      DC Planning Services  CM consult      Choice offered to / List presented to:  NA   DME arranged  NA      DME agency  NA     HH arranged  NA      HH agency  NA   Status of service:  In process, will continue to follow Medicare Important Message given?   (If response is "NO", the following Medicare IM given date fields will be blank) Date Medicare IM given:   Date Additional Medicare IM given:    Discharge Disposition:    Per UR Regulation:  Reviewed for med. necessity/level of care/duration of stay  If discussed at Long Length of Stay Meetings, dates discussed:    Comments:  01/15/13 1300 Niyla Marone,RN,MSN [phone] Chart reviewed for utilization of sevices. No needs identified at this time.

## 2013-01-15 NOTE — Progress Notes (Signed)
INITIAL NUTRITION ASSESSMENT  DOCUMENTATION CODES Per approved criteria  -Obesity Unspecified   INTERVENTION: - Ensure Complete daily - Encouraged increased meal intake - Will continue to monitor   NUTRITION DIAGNOSIS: Increased nutrient needs related to pancreatic CA getting ongoing chemotherapy as evidenced by MD notes.   Goal: Pt to consume >90% of meals/supplements  Monitor:  Weights, labs, intake  Reason for Assessment: Malnutrition screening tool   72 y.o. female  Admitting Dx: Cellulitis of right lower leg  ASSESSMENT: Pt is a 72 y.o. female who presents to the ED with c/o 3-4 day history of SOB. Symptoms progressively worsening. Her symptoms occur in the context of ongoing chemo treatment for pancreatic cancer with a regimine including gemcytobine. She has also developed BLE swelling as well as RLE erythema, and tenderness. Pt has been seen by outpatient [facility] RD earlier this month.   Met with pt who reports 27 pound unintended weight loss in the past 3 months however reports she has gained 6 pounds in the past 3 weeks. Reports she eats 3 meals/day at home, some days her portion sizes are smaller than others. Drinks Ensure Plus as needed at home, usually 2 per week. Reports not taking her Megace for the past week. Reports her blood sugar is on the lower end in the mornings at home. C/o slight nausea. Ate <50% of breakfast this morning. Pt with some mild/moderate muscle wasting in upper arms.   Potassium low, getting oral replacement and daily multivitamin.   Height: Ht Readings from Last 1 Encounters:  01/14/13 5' 6.53" (1.69 m)    Weight: Wt Readings from Last 1 Encounters:  01/14/13 227 lb 1.2 oz (103 kg)    Ideal Body Weight: 130 lb  % Ideal Body Weight: 175%  Wt Readings from Last 10 Encounters:  01/14/13 227 lb 1.2 oz (103 kg)  01/08/13 227 lb 8 oz (103.193 kg)  01/02/13 225 lb 4.8 oz (102.195 kg)  12/25/12 220 lb 6.4 oz (99.973 kg)   12/23/12 222 lb (100.699 kg)  12/05/12 219 lb (99.338 kg)  11/19/12 221 lb 2 oz (100.302 kg)  11/19/12 221 lb 2 oz (100.302 kg)  11/14/12 224 lb 8 oz (101.833 kg)  11/12/12 224 lb 9.6 oz (101.878 kg)    Usual Body Weight: 247 lb per pt  % Usual Body Weight: 92%  BMI:  Body mass index is 36.06 kg/(m^2). Class II obesity  Estimated Nutritional Needs: Kcal: 1500-1700 Protein: 70-80g Fluid: 1.6-1.7L/day  Skin: +3 RLE, LLE edema  Diet Order: Carb Control  EDUCATION NEEDS: -No education needs identified at this time   Intake/Output Summary (Last 24 hours) at 01/15/13 1125 Last data filed at 01/15/13 0429  Gross per 24 hour  Intake   1235 ml  Output      0 ml  Net   1235 ml    Last BM: 11/25  Labs:   Recent Labs Lab 01/14/13 1809 01/15/13 0549  NA 135 141  K 3.3* 3.0*  CL 97 104  CO2 26 28  BUN 9 7  CREATININE 0.59 0.52  CALCIUM 8.5 8.5  GLUCOSE 134* 101*    CBG (last 3)   Recent Labs  01/14/13 2241 01/15/13 0734  GLUCAP 73 96    Scheduled Meds: . cyanocobalamin  1,000 mcg Intramuscular Q30 days  . doxazosin  8 mg Oral q morning - 10a  . escitalopram  20 mg Oral q morning - 10a  . famotidine  20 mg Oral  QHS  . furosemide  40 mg Oral q morning - 10a  . insulin glargine  70 Units Subcutaneous q morning - 10a  . levothyroxine  88 mcg Oral QAC breakfast  . lidocaine-prilocaine  1 application Topical Once  . losartan  25 mg Oral q morning - 10a  . multivitamins with iron  1 tablet Oral Daily  . potassium chloride  40 mEq Oral Daily  . pregabalin  150 mg Oral QHS  . simvastatin  20 mg Oral QHS  . vancomycin  1,000 mg Intravenous Q12H  . warfarin  12.5 mg Oral ONCE-1800  . Warfarin - Pharmacist Dosing Inpatient   Does not apply q1800    Continuous Infusions:   Past Medical History  Diagnosis Date  . Trigger finger   . Hyperlipidemia   . Diabetes mellitus   . Chronic venous insufficiency   . Morbid obesity   . Hypertension   . Thyroid  disease   . GERD (gastroesophageal reflux disease)   . Hypothyroidism   . Arthritis   . Anemia   . Common bile duct dilatation 10/01/12  . Fatty liver 10/01/12  . Portal vein thrombosis   . Cholelithiasis   . Pancreatic lesion 10/01/12  . Hydroureteronephrosis     right  . Pancreatic mass 2014  . Cancer 10/17/12     pancreatic   . Allergy   . Anxiety   . Neuromuscular disorder     neuropathy    Past Surgical History  Procedure Laterality Date  . Lumbar laminectomy    . Breast biopsy    . Eus N/A 10/17/2012    Procedure: UPPER ENDOSCOPIC ULTRASOUND (EUS) LINEAR;  Surgeon: Rachael Fee, MD;  Location: [facility] ENDOSCOPY;  Service: Endoscopy;  Laterality: N/A;  . Portacath placement Left 11/19/2012    Procedure: INSERTION PORT-A-CATH;  Surgeon: Almond Lint, MD;  Location: [facility] ORS;  Service: General;  Laterality: Left;    Levon Hedger MS, RD, LDN [phone] Pager [phone] After Hours Pager

## 2013-01-16 DIAGNOSIS — L0291 Cutaneous abscess, unspecified: Secondary | ICD-10-CM

## 2013-01-16 LAB — PROTIME-INR: INR: 2.24 — ABNORMAL HIGH (ref 0.00–1.49)

## 2013-01-16 LAB — TYPE AND SCREEN
ABO/RH(D): A POS
Unit division: 0

## 2013-01-16 LAB — GLUCOSE, CAPILLARY: Glucose-Capillary: 100 mg/dL — ABNORMAL HIGH (ref 70–99)

## 2013-01-16 MED ORDER — CLINDAMYCIN HCL 300 MG PO CAPS: 300.0000 mg | ORAL_CAPSULE | Freq: Three times a day (TID) | ORAL | Status: DC

## 2013-01-16 MED ORDER — HEPARIN SOD (PORK) LOCK FLUSH 100 UNIT/ML IV SOLN
500.0000 [IU] | Freq: Once | INTRAVENOUS | Status: DC
  Filled 2013-01-16: qty 5

## 2013-01-16 NOTE — Discharge Summary (Signed)
Physician Discharge Summary  Theresa Summers:[id number] DOB: 1940/03/21 DOA: 01/14/2013  PCP: Ruthe Mannan, MD  Admit date: 01/14/2013 Discharge date: 01/16/2013  Recommendations for Outpatient Follow-up:  1. continue clindamycin for 7 days on discharge. Please followup in cancer Center per scheduled appointment. 2. please continue taking potassium as you did before, 20 meq daily. 3. Continue coumadin as before your home medication regimen, INR was 2.24 today. 4. Hold metformin at least until infection is cleared and may continue insulin   Discharge Diagnoses:  Principal Problem:   Cellulitis of right lower leg Active Problems:   Acute blood loss anemia   DM   DIABETIC PERIPHERAL NEUROPATHY   HYPERLIPIDEMIA   HYPERTENSION   Unspecified hypothyroidism   GERD (gastroesophageal reflux disease)   Portal vein thrombosis   Pancreatic cancer   Malignant neoplasm of head of pancreas   Anemia of chronic disease   Neutropenia    Discharge Condition: medically stable for discharge home today; pt insist ongoing home today   Diet recommendation: as toelrated  History of present illness:  72 year old female with past medical history of pancreatic adenocarcinoma (under Dr. Truett Perna' care), diabetes, portal vein thrombosis on Coumadin who presented to [facility] ED 01/14/2013 with worsening shortness of breath for a few days prior to this admission. There was no associated fevers or chest pain. Patient also noticed to have right lower extremity more swollen and red which has been treated with antibiotics outpatient with no significant improvement. Lower extremity Doppler done on the admission did not reveal DVT.   Assessment/Plan:   Principal Problem:  Acute blood loss anemia  - unclear etiology; perhaps related to history of pancreatic adenocarcinoma  - hemoglobin on admission 6.9 and status post 2 units of PRBC transfusion hemoglobin is 8.1  - will send a message to Dr. Truett Perna to recheck CBC  in cancer center Active Problems:  Portal vein thrombosis  - Continue Coumadin - INR 2.24 this am Pancreatic adenocarcinoma  - per oncology Right lower extremity cellulitis  - Patient was given clindamycin on the admission but then was started on vancomycin  - Will prescribe clindamycin for 7 days on discharge  Hypokalemia  - being repleted with potassium chloride which she can take as previous home dose, 20 meq daily  Neutropenia  - likely sequela of chemotherapy   - no fevers  Hypothyroidism  - Continue Synthroid 88 mcg daily  Diabetes with complications of neuropathy, no mention if uncontrolled  - Hold metformin due to RLE cellulitis  Diabetic neuropathy  - continue lyrica  Overactive bladder  - continue cardura  Hypertension  - on losartan, cardura  Dyslipidemia  - continue statin therapy  Depression  - on lexapro   Code Status: full code  Family Communication: no family at the bedside    Consultants:  None  Procedures:  LE doppler - negative for B/L DVT Antibiotics:  Clindamycin given on admission only  Vancomycin 01/14/2013 --> 01/16/2013 Clindamycin for 7 days on discharge    Signed:  Manson Passey, MD  [facility] 01/16/2013, 8:27 AM  Pager #: [phone]   Discharge Exam: Filed Vitals:   01/16/13 0516  BP: 138/60  Pulse: 79  Temp: 98.3 F (36.8 C)  Resp: 20   Filed Vitals:   01/15/13 1100 01/15/13 1400 01/15/13 2132 01/16/13 0516  BP:  142/54 144/53 138/60  Pulse:  86 80 79  Temp:  100 F (37.8 C) 98.8 F (37.1 C) 98.3 F (36.8 C)  TempSrc:  Oral  Oral Oral  Resp:  20 20 20   Height:      Weight: 101.288 kg (223 lb 4.8 oz)     SpO2:  97% 96% 96%    General: Pt is alert, follows commands appropriately, not in acute distress Cardiovascular: Regular rate and rhythm, S1/S2 +, no murmurs, no rubs, no gallops Respiratory: Clear to auscultation bilaterally, no wheezing, no crackles, no rhonchi Abdominal: Soft, non tender, non  distended, bowel sounds +, no guarding Extremities:RLE cellulitis improving, no cyanosis, pulses palpable bilaterally DP and PT Neuro: Grossly nonfocal  Discharge Instructions  Discharge Orders   Future Appointments Provider Department Dept Phone   01/22/2013 11:45 AM Windell Hum[place] [facility] [phone]   01/22/2013 12:15 PM Rana Snare, NP [place] [facility] [phone]   01/22/2013 1:00 PM [facility] [place] [facility] [phone]   01/22/2013 1:15 PM [facility] Anti Coag [place] [facility] [phone]   01/29/2013 10:30 AM Mauri [place] [facility] [phone]   01/29/2013 11:00 AM Ladene Artist, MD [facility] MEDICAL ONCOLOGY [phone]   01/29/2013 12:00 PM [facility] [place] [facility] [phone]   01/31/2013 1:45 PM Hart Carwin, MD [facility] [phone]   02/05/2013 9:30 AM Mauri [place] [facility] [phone]   02/05/2013 10:00 AM [facility] [place] [facility] [phone]   02/05/2013 10:30 AM Anabel Bene, RD [place] [facility] [phone]   02/07/2013 1:50 PM [facility] [place]  IMAGING [phone]   Patient should wear two piece clothing and wear no powder or deodorant. Patient should arrive 15 minutes early.   Future Orders Complete By Expires   Call MD for:  difficulty breathing, headache or visual disturbances  As directed    Call MD for:  persistant dizziness or light-headedness  As directed    Call MD for:  persistant nausea and vomiting  As directed    Call MD for:  severe uncontrolled pain  As directed    Diet - low sodium heart healthy  As directed    Discharge instructions  As directed    Comments:     1. continue clindamycin  for 7 days on discharge. Please followup in cancer Center per scheduled appointment. 2. please continue taking potassium as you did before, 20 meq daily. 3. Continue coumadin as before your home medication regimen, INR was 2.24 today.   Increase activity slowly  As directed        Medication List    STOP taking these medications       doxycycline 100 MG tablet  Commonly known as:  VIBRA-TABS     metFORMIN 500 MG tablet  Commonly known as:  GLUCOPHAGE      TAKE these medications       acetaminophen 325 MG tablet  Commonly known as:  TYLENOL  Take 650 mg by mouth daily as needed for pain. For pain     Biotin 5000 MCG Caps  Take 5,000 mcg by mouth at bedtime.     clindamycin 300 MG capsule  Commonly known as:  CLEOCIN  Take 1 capsule (300 mg total) by mouth 3 (three) times daily.     doxazosin 8 MG tablet  Commonly known as:  CARDURA  Take 8 mg by mouth every morning.  escitalopram 20 MG tablet  Commonly known as:  LEXAPRO  Take 20 mg by mouth every morning.     famotidine 20 MG tablet  Commonly known as:  PEPCID  Take 20 mg by mouth at bedtime.     furosemide 40 MG tablet  Commonly known as:  LASIX  Take 40 mg by mouth every morning.     HYDROmorphone 2 MG tablet  Commonly known as:  DILAUDID  Take 0.5-1 tablets (1-2 mg total) by mouth every 4 (four) hours as needed.     LANTUS SOLOSTAR 100 UNIT/ML Sopn  Generic drug:  Insulin Glargine  Inject 70 Units into the skin every morning.     levothyroxine 88 MCG tablet  Commonly known as:  SYNTHROID, LEVOTHROID  Take 88 mcg by mouth daily before breakfast. Name brand only     lidocaine-prilocaine cream  Commonly known as:  EMLA  Apply 1 application topically as needed. Apply 1 teaspoon to PAC site 1-2 hours prior to stick and cover with plastic wrap to numb site. Do not rub cream in.     losartan 25 MG tablet  Commonly known as:  COZAAR  Take 25 mg by mouth every morning.     MAXFE 160-1 MG Tabs  Take 1  tablet by mouth daily.     megestrol 40 MG/ML suspension  Commonly known as:  MEGACE  Take 200 mg by mouth daily. One teaspoon daily per patient     ondansetron 4 MG tablet  Commonly known as:  ZOFRAN  TAKE 1 TABLET BY MOUTH EVERY 8 HOURS AS NEEDED FOR NAUSEA     potassium chloride 20 MEQ packet  Commonly known as:  KLOR-CON  Take 20 mEq by mouth every morning.     pregabalin 75 MG capsule  Commonly known as:  LYRICA  Take 75-150 mg by mouth 2 (two) times daily. 75mg  in tje morning and 150mg  at bedtime     prochlorperazine 10 MG tablet  Commonly known as:  COMPAZINE  Take 1 tablet (10 mg total) by mouth every 6 (six) hours as needed (nausea).     simvastatin 20 MG tablet  Commonly known as:  ZOCOR  Take 20 mg by mouth at bedtime.     SYSTANE OP  Apply 1 drop to eye 3 (three) times daily as needed (dry eyes).     temazepam 15 MG capsule  Commonly known as:  RESTORIL  TAKE ONE CAPSULE BY MOUTH AT BEDTIME AS NEEDED FOR SLEEP *DO NOT TAKE WITH XANAX OR TRAZADONE*     traMADol 50 MG tablet  Commonly known as:  ULTRAM  Take 50 mg by mouth every 6 (six) hours as needed for pain.     warfarin 10 MG tablet  Commonly known as:  COUMADIN  Take 10 mg by mouth daily. Tuesday, Thursday, Saturday 15mg . Sunday, Monday, wed., Friday 10mg .           Follow-up Information   Follow up with Ruthe Mannan, MD. Schedule an appointment as soon as possible for a visit in 1 week.   Specialty:  Family Medicine   Contact information:   [address] [place] [address] [phone]        The results of significant diagnostics from this hospitalization (including imaging, microbiology, ancillary and laboratory) are listed below for reference.    Significant Diagnostic Studies: Dg Chest 2 View  01/14/2013   CLINICAL DATA:  Shortness of breath, hypertension, diabetes  EXAM: CHEST  2  VIEW  COMPARISON:  11/19/2012  FINDINGS: Left subclavian power injectable port catheter tip at the  lower SVC. Cardiomegaly with vascular and interstitial prominence, mild edema not excluded. No focal pneumonia, collapse or consolidation. No effusion or pneumothorax. Trachea is midline. Atherosclerosis of the aorta. Diffuse degenerative changes of the spine. No compression fracture.  IMPRESSION: Cardiomegaly with vascular congestion versus early interstitial edema.   Electronically Signed   By: Ruel Favors M.D.   On: 01/14/2013 18:38    Microbiology: Recent Results (from the past 240 hour(s))  TECHNOLOGIST REVIEW     Status: None   Collection Time    01/08/13  8:49 AM      Result Value Range Status   Technologist Review     Final   Value: rare meta, moderate ovalos, few teardrops and shistocytes     Labs: Basic Metabolic Panel:  Recent Labs Lab 01/14/13 1809 01/15/13 0549  NA 135 141  K 3.3* 3.0*  CL 97 104  CO2 26 28  GLUCOSE 134* 101*  BUN 9 7  CREATININE 0.59 0.52  CALCIUM 8.5 8.5   Liver Function Tests:  Recent Labs Lab 01/14/13 1809  AST 30  ALT 30  ALKPHOS 50  BILITOT 0.3  PROT 6.1  ALBUMIN 3.0*   No results found for this basename: LIPASE, AMYLASE,  in the last 168 hours No results found for this basename: AMMONIA,  in the last 168 hours CBC:  Recent Labs Lab 01/14/13 1809 01/15/13 0549  WBC 5.4 3.3*  NEUTROABS 4.3  --   HGB 6.9* 8.1*  HCT 21.4* 24.7*  MCV 82.3 81.8  PLT 220 212   Cardiac Enzymes: No results found for this basename: CKTOTAL, CKMB, CKMBINDEX, TROPONINI,  in the last 168 hours BNP: BNP (last 3 results)  Recent Labs  01/14/13 1809  PROBNP 419.5*   CBG:  Recent Labs Lab 01/15/13 0734 01/15/13 1205 01/15/13 1743 01/15/13 2136 01/16/13 0730  GLUCAP 96 129* 130* 142* 100*    Time coordinating discharge: Over 30 minutes

## 2013-01-19 ENCOUNTER — Other Ambulatory Visit: Payer: Self-pay | Admitting: Oncology

## 2013-01-20 ENCOUNTER — Other Ambulatory Visit: Payer: Self-pay | Admitting: Nurse Practitioner

## 2013-01-21 ENCOUNTER — Telehealth: Payer: Self-pay | Admitting: Family Medicine

## 2013-01-21 NOTE — Telephone Encounter (Signed)
Transitional Care Call.  Pt d/c'd home from hospital on 01/16/13.  D/C dx: Cellulitis of right lower leg.  Spoke with pt's husband.  He states that pt is "feeling better" and felt good enough to go into work today.  He states that nausea and diarrhea have been controlled.  Denies issues with pain  control unless edema increases in her legs.  Pt is able to ambulate independently but husband is trying to "keep her off of her feet as much as possible" to help with the edema.  He has been searching on the internet for ways to help with redness/stinging r/t cellulitis in her legs.  He states that they have tried OTC lotions such as Gold Bond and he read that ice packs would help but pt will not tolerate this.  He would like to know if there is anything else that they can try to relieve those symptoms.  Denies any questions r/t d/c instructions or medications.  Pt continues to take abx and Coumadin.  She is scheduled for Chemo tx tomorrow.  Follow up visit scheduled for 01/30/13 at 11am.

## 2013-01-22 ENCOUNTER — Telehealth: Payer: Self-pay | Admitting: Oncology

## 2013-01-22 ENCOUNTER — Ambulatory Visit: Payer: 59 | Admitting: Pharmacist

## 2013-01-22 ENCOUNTER — Other Ambulatory Visit (HOSPITAL_BASED_OUTPATIENT_CLINIC_OR_DEPARTMENT_OTHER): Payer: 59 | Admitting: Lab

## 2013-01-22 ENCOUNTER — Ambulatory Visit (HOSPITAL_BASED_OUTPATIENT_CLINIC_OR_DEPARTMENT_OTHER): Payer: 59 | Admitting: Nurse Practitioner

## 2013-01-22 ENCOUNTER — Ambulatory Visit (HOSPITAL_BASED_OUTPATIENT_CLINIC_OR_DEPARTMENT_OTHER): Payer: 59

## 2013-01-22 VITALS — BP 149/67 | HR 92 | Temp 96.8°F | Resp 20 | Ht 66.53 in | Wt 221.3 lb

## 2013-01-22 DIAGNOSIS — D6481 Anemia due to antineoplastic chemotherapy: Secondary | ICD-10-CM

## 2013-01-22 DIAGNOSIS — E1149 Type 2 diabetes mellitus with other diabetic neurological complication: Secondary | ICD-10-CM

## 2013-01-22 DIAGNOSIS — I81 Portal vein thrombosis: Secondary | ICD-10-CM

## 2013-01-22 DIAGNOSIS — C259 Malignant neoplasm of pancreas, unspecified: Secondary | ICD-10-CM

## 2013-01-22 DIAGNOSIS — D61810 Antineoplastic chemotherapy induced pancytopenia: Secondary | ICD-10-CM

## 2013-01-22 DIAGNOSIS — Z5111 Encounter for antineoplastic chemotherapy: Secondary | ICD-10-CM

## 2013-01-22 DIAGNOSIS — C250 Malignant neoplasm of head of pancreas: Secondary | ICD-10-CM

## 2013-01-22 DIAGNOSIS — D638 Anemia in other chronic diseases classified elsewhere: Secondary | ICD-10-CM

## 2013-01-22 DIAGNOSIS — R609 Edema, unspecified: Secondary | ICD-10-CM

## 2013-01-22 DIAGNOSIS — N1330 Unspecified hydronephrosis: Secondary | ICD-10-CM

## 2013-01-22 DIAGNOSIS — E1142 Type 2 diabetes mellitus with diabetic polyneuropathy: Secondary | ICD-10-CM

## 2013-01-22 DIAGNOSIS — L539 Erythematous condition, unspecified: Secondary | ICD-10-CM

## 2013-01-22 LAB — CBC WITH DIFFERENTIAL/PLATELET
Basophils Absolute: 0.1 10*3/uL (ref 0.0–0.1)
EOS%: 3.6 % (ref 0.0–7.0)
HCT: 32.2 % — ABNORMAL LOW (ref 34.8–46.6)
HGB: 10.1 g/dL — ABNORMAL LOW (ref 11.6–15.9)
LYMPH%: 18.1 % (ref 14.0–49.7)
MCH: 26 pg (ref 25.1–34.0)
MCV: 83 fL (ref 79.5–101.0)
MONO%: 10.6 % (ref 0.0–14.0)
NEUT%: 67 % (ref 38.4–76.8)
Platelets: 164 10*3/uL (ref 145–400)
lymph#: 1.2 10*3/uL (ref 0.9–3.3)
nRBC: 0 % (ref 0–0)

## 2013-01-22 LAB — PROTIME-INR: INR: 2.4 (ref 2.00–3.50)

## 2013-01-22 LAB — COMPREHENSIVE METABOLIC PANEL (CC13)
ALT: 14 U/L (ref 0–55)
Albumin: 3 g/dL — ABNORMAL LOW (ref 3.5–5.0)
Alkaline Phosphatase: 59 U/L (ref 40–150)
BUN: 5.1 mg/dL — ABNORMAL LOW (ref 7.0–26.0)
Calcium: 8.8 mg/dL (ref 8.4–10.4)
Creatinine: 0.6 mg/dL (ref 0.6–1.1)
Glucose: 179 mg/dl — ABNORMAL HIGH (ref 70–140)
Potassium: 3.6 mEq/L (ref 3.5–5.1)

## 2013-01-22 MED ORDER — HYDROMORPHONE HCL 2 MG PO TABS: 1.0000 mg | ORAL_TABLET | ORAL | Status: DC | PRN

## 2013-01-22 MED ORDER — HEPARIN SOD (PORK) LOCK FLUSH 100 UNIT/ML IV SOLN
500.0000 [IU] | Freq: Once | INTRAVENOUS | Status: AC | PRN
  Administered 2013-01-22: 500 [IU]
  Filled 2013-01-22: qty 5

## 2013-01-22 MED ORDER — SODIUM CHLORIDE 0.9 % IV SOLN
Freq: Once | INTRAVENOUS | Status: AC
  Administered 2013-01-22: 13:00:00 via INTRAVENOUS

## 2013-01-22 MED ORDER — DEXAMETHASONE SODIUM PHOSPHATE 20 MG/5ML IJ SOLN
INTRAMUSCULAR | Status: AC
  Filled 2013-01-22: qty 5

## 2013-01-22 MED ORDER — ONDANSETRON 8 MG/50ML IVPB (CHCC)
8.0000 mg | Freq: Once | INTRAVENOUS | Status: AC
  Administered 2013-01-22: 8 mg via INTRAVENOUS

## 2013-01-22 MED ORDER — DEXAMETHASONE SODIUM PHOSPHATE 10 MG/ML IJ SOLN
INTRAMUSCULAR | Status: AC
  Filled 2013-01-22: qty 1

## 2013-01-22 MED ORDER — ONDANSETRON 8 MG/NS 50 ML IVPB
INTRAVENOUS | Status: AC
  Filled 2013-01-22: qty 8

## 2013-01-22 MED ORDER — PACLITAXEL PROTEIN-BOUND CHEMO INJECTION 100 MG
80.0000 mg/m2 | Freq: Once | INTRAVENOUS | Status: AC
  Administered 2013-01-22: 175 mg via INTRAVENOUS
  Filled 2013-01-22: qty 35

## 2013-01-22 MED ORDER — DEXAMETHASONE SODIUM PHOSPHATE 10 MG/ML IJ SOLN
10.0000 mg | Freq: Once | INTRAMUSCULAR | Status: AC
  Administered 2013-01-22: 10 mg via INTRAVENOUS

## 2013-01-22 MED ORDER — SODIUM CHLORIDE 0.9 % IJ SOLN
10.0000 mL | INTRAMUSCULAR | Status: DC | PRN
  Administered 2013-01-22: 10 mL
  Filled 2013-01-22: qty 10

## 2013-01-22 NOTE — Progress Notes (Signed)
INR at goal Today Pt is doing well and seen in the infusion area for treatment Pt was in the hospital last week for cellulitis and received IV antibiotics that improved the cellulitis Due to this Dr. Truett Perna is stopping the Gemzar for now Pt reports no unusual bleeding or bruising No missed doses (pt did receive 1 dose of 12.5 mg of coumadin while in the hospital as a boost for a slightly subtherapeutic INR) No diet changes to report We will make no changes to her plan Continue coumadin 5 mg daily except for 7.5 mg on Tues, Thurs, and Saturdays.  Return to clinic in 1 week with scheduled lab at 11:30 and infusion at noon on 01/29/13

## 2013-01-22 NOTE — Patient Instructions (Signed)
INR at goal No changes Continue coumadin 5 mg daily except for 7.5 mg on Tues, Thurs, and Saturdays.  Return to clinic in 1 week with scheduled lab at 11:30 and infusion at noon on 01/29/13

## 2013-01-22 NOTE — Telephone Encounter (Signed)
Gave pt appt for lab,chemo and MD for December , eamiled Michelle regarding chemo for December andf January 2015

## 2013-01-22 NOTE — Progress Notes (Addendum)
OFFICE PROGRESS NOTE  Interval history:  Theresa Summers returns for followup of pancreas cancer. She was last treated with gemcitabine/Abraxane on 01/09/2013. She noted increased swelling and redness of the right lower leg beginning the following day. She was subsequently hospitalized 01/14/2013 for treatment of "cellulitis". She received vancomycin and was discharged home on 01/16/2013 on clindamycin. A repeat venous Doppler was negative for DVT.  She denies nausea/vomiting. No mouth sores. No diarrhea. She received a blood transfusion during the recent hospitalization and notes improvement in shortness of breath. She denies any bleeding. Specifically no bloody or black stools. No significant abdominal pain. The right lower leg "burns and stings". She takes Dilaudid as needed.   Objective: Blood pressure 149/67, pulse 92, temperature 96.8 F (36 C), temperature source Oral, resp. rate 20, height 5' 6.53" (1.69 m), weight 221 lb 4.8 oz (100.381 kg).  No thrush or ulceration. Lungs are clear. Regular cardiac rhythm. Port-A-Cath site is without erythema. Abdomen is soft. No mass. No hepatomegaly. Pitting edema at the lower legs bilaterally right greater than left. Marked tenderness with palpation at the right lower leg. Mild erythema at the right lateral and lower leg.  Lab Results: Lab Results  Component Value Date   WBC 6.7 01/22/2013   HGB 10.1* 01/22/2013   HCT 32.2* 01/22/2013   MCV 83.0 01/22/2013   PLT 164 01/22/2013    Chemistry:    Chemistry      Component Value Date/Time   NA 141 01/15/2013 0549   NA 142 12/19/2012 1120   K 3.0* 01/15/2013 0549   K 4.0 12/19/2012 1120   CL 104 01/15/2013 0549   CO2 28 01/15/2013 0549   CO2 22 12/19/2012 1120   BUN 7 01/15/2013 0549   BUN 8.4 12/19/2012 1120   CREATININE 0.52 01/15/2013 0549   CREATININE 0.7 12/19/2012 1120      Component Value Date/Time   CALCIUM 8.5 01/15/2013 0549   CALCIUM 8.9 12/19/2012 1120   ALKPHOS 50 01/14/2013 1809    ALKPHOS 52 12/19/2012 1120   AST 30 01/14/2013 1809   AST 17 12/19/2012 1120   ALT 30 01/14/2013 1809   ALT 16 12/19/2012 1120   BILITOT 0.3 01/14/2013 1809   BILITOT 0.28 12/19/2012 1120       Studies/Results: Dg Chest 2 View  01/14/2013   CLINICAL DATA:  Shortness of breath, hypertension, diabetes  EXAM: CHEST  2 VIEW  COMPARISON:  11/19/2012  FINDINGS: Left subclavian power injectable port catheter tip at the lower SVC. Cardiomegaly with vascular and interstitial prominence, mild edema not excluded. No focal pneumonia, collapse or consolidation. No effusion or pneumothorax. Trachea is midline. Atherosclerosis of the aorta. Diffuse degenerative changes of the spine. No compression fracture.  IMPRESSION: Cardiomegaly with vascular congestion versus early interstitial edema.   Electronically Signed   By: Ruel Favors M.D.   On: 01/14/2013 18:38    Medications: I have reviewed the patient's current medications.  Assessment/Plan:  1. Adenocarcinoma pancreas, pancreas head mass, endoscopic ultrasound on 10/17/2012 confirmed a pancreas head mass without vascular invasion or surrounding lymphadenopathy, biopsy positive for adenocarcinoma.  Elevated CA 19-9.  Initiation of gemcitabine/Abraxane on a three out of four week schedule on 11/21/2012.  CA 19-9 improved at 571 on 12/25/2012. 2. 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 She is maintained on Coumadin.  3. Diabetes.  4. Right hydronephrosis-? Related to history of kidney stones versus carcinomatosis.  5. "Cirrhotic" changes and  splenomegaly reported on the chest CT 10/25/2012.  6. History of kidney stones.  7. Hypothyroidism.  8. Normocytic anemia-secondary to chronic disease and chemotherapy , status post a red cell transfusion 01/15/2013. 9. 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  10. Pain secondary to  pancreas cancer-she is taking Dilaudid. She denies abdominal pain at today's visit.  11. Pain/tenderness, edema, erythema right lower leg 12/25/2012-negative venous duplex 12/25/2012, 01/08/2013 and 01/14/2013. No improvement with multiple courses of antibiotics. Question pseudo-cellulitis secondary to gemcitabine.  12. Pancytopenia secondary to chemotherapy.  Disposition-she has completed 6 treatments with gemcitabine/Abraxane. The CA 19-9 was improved on 12/25/2012.  She has persistent edema and erythema of the right lower leg. Three venous Doppler studies have been negative for DVT. There has been no significant improvement despite multiple courses of antibiotics. Dr. Truett Perna is concerned for pseudo-cellulitis related to gemcitabine and thus recommends discontinuation of gemcitabine. She will continue treatment with single agent Abraxane weekly x3.  We are referring her for restaging CT scans on 02/10/2013 and she will return for a followup visit on 02/12/2013. She understands to contact the office in the interim with any problems.   Patient seen with Dr. Truett Perna.   25 minutes were spent face-to-face at today's visit with the majority of that time involved in counseling/coordination of care.   Lonna Cobb ANP/GNP-BC   This was a shared visit with Lonna Cobb.  She was admitted with increased right leg erythema and pain on 01/14/2013. She was treated with antibiotics and discharged on 01/16/2013. She continues to have tenderness at the right lower leg. Erythema has improved. She also had shortness of breath on admission and was transfused with packed red blood cells.  I suspect the erythema may be related to pseudo-cellulitis from gemcitabine. We decided to discontinue gemcitabine from the chemotherapy regimen. She will continue single agent Abraxane for 3 weeks and then undergo a restaging CT.  Mancel Bale, M.D.

## 2013-01-22 NOTE — Telephone Encounter (Signed)
pts husband came back in wanted ct time changed from 9a to 7a called  RAD sw Lyla Son this time change was made for CT DEC cal printed shh

## 2013-01-22 NOTE — Patient Instructions (Signed)
[facility] Discharge Instructions for Patients Receiving Chemotherapy  Today you received the following chemotherapy agents Abraxane.   To help prevent nausea and vomiting after your treatment, we encourage you to take your nausea medication.   If you develop nausea and vomiting that is not controlled by your nausea medication, call the clinic.   BELOW ARE SYMPTOMS THAT SHOULD BE REPORTED IMMEDIATELY:  *FEVER GREATER THAN 100.5 F  *CHILLS WITH OR WITHOUT FEVER  NAUSEA AND VOMITING THAT IS NOT CONTROLLED WITH YOUR NAUSEA MEDICATION  *UNUSUAL SHORTNESS OF BREATH  *UNUSUAL BRUISING OR BLEEDING  TENDERNESS IN MOUTH AND THROAT WITH OR WITHOUT PRESENCE OF ULCERS  *URINARY PROBLEMS  *BOWEL PROBLEMS  UNUSUAL RASH Items with * indicate a potential emergency and should be followed up as soon as possible.  Feel free to call the clinic you have any questions or concerns. The clinic phone number is [phone].    

## 2013-01-23 ENCOUNTER — Telehealth: Payer: Self-pay | Admitting: *Deleted

## 2013-01-23 ENCOUNTER — Telehealth: Payer: Self-pay | Admitting: Oncology

## 2013-01-23 LAB — CANCER ANTIGEN 19-9: CA 19-9: 257.3 U/mL — ABNORMAL HIGH (ref <35.0)

## 2013-01-23 NOTE — Telephone Encounter (Signed)
Per staff message and POF I have scheduled appts.  JMW  

## 2013-01-23 NOTE — Telephone Encounter (Signed)
Talked to  pt gave her appts for  lab,md and chemo for December 2014

## 2013-01-29 ENCOUNTER — Ambulatory Visit (HOSPITAL_BASED_OUTPATIENT_CLINIC_OR_DEPARTMENT_OTHER): Payer: 59 | Admitting: Pharmacist

## 2013-01-29 ENCOUNTER — Ambulatory Visit (HOSPITAL_BASED_OUTPATIENT_CLINIC_OR_DEPARTMENT_OTHER): Payer: 59

## 2013-01-29 ENCOUNTER — Other Ambulatory Visit (HOSPITAL_BASED_OUTPATIENT_CLINIC_OR_DEPARTMENT_OTHER): Payer: 59

## 2013-01-29 ENCOUNTER — Ambulatory Visit: Payer: 59 | Admitting: Oncology

## 2013-01-29 VITALS — BP 144/46 | HR 73 | Temp 98.0°F

## 2013-01-29 DIAGNOSIS — C259 Malignant neoplasm of pancreas, unspecified: Secondary | ICD-10-CM

## 2013-01-29 DIAGNOSIS — C250 Malignant neoplasm of head of pancreas: Secondary | ICD-10-CM

## 2013-01-29 DIAGNOSIS — I81 Portal vein thrombosis: Secondary | ICD-10-CM

## 2013-01-29 DIAGNOSIS — Z5111 Encounter for antineoplastic chemotherapy: Secondary | ICD-10-CM

## 2013-01-29 DIAGNOSIS — N1330 Unspecified hydronephrosis: Secondary | ICD-10-CM

## 2013-01-29 LAB — CBC WITH DIFFERENTIAL/PLATELET
BASO%: 1.8 % (ref 0.0–2.0)
Eosinophils Absolute: 0.2 10*3/uL (ref 0.0–0.5)
HCT: 33.2 % — ABNORMAL LOW (ref 34.8–46.6)
LYMPH%: 28.6 % (ref 14.0–49.7)
MCHC: 31.3 g/dL — ABNORMAL LOW (ref 31.5–36.0)
MCV: 84.3 fL (ref 79.5–101.0)
MONO#: 0.3 10*3/uL (ref 0.1–0.9)
MONO%: 7.4 % (ref 0.0–14.0)
NEUT%: 57.7 % (ref 38.4–76.8)
Platelets: 246 10*3/uL (ref 145–400)
RBC: 3.94 10*6/uL (ref 3.70–5.45)
RDW: 19.2 % — ABNORMAL HIGH (ref 11.2–14.5)
WBC: 4.4 10*3/uL (ref 3.9–10.3)

## 2013-01-29 LAB — POCT INR: INR: 1.2

## 2013-01-29 LAB — PROTIME-INR: INR: 1.2 — ABNORMAL LOW (ref 2.00–3.50)

## 2013-01-29 MED ORDER — HEPARIN SOD (PORK) LOCK FLUSH 100 UNIT/ML IV SOLN
500.0000 [IU] | Freq: Once | INTRAVENOUS | Status: AC | PRN
  Administered 2013-01-29: 500 [IU]
  Filled 2013-01-29: qty 5

## 2013-01-29 MED ORDER — SODIUM CHLORIDE 0.9 % IV SOLN
Freq: Once | INTRAVENOUS | Status: AC
  Administered 2013-01-29: 12:00:00 via INTRAVENOUS

## 2013-01-29 MED ORDER — ONDANSETRON 8 MG/50ML IVPB (CHCC)
8.0000 mg | Freq: Once | INTRAVENOUS | Status: AC
  Administered 2013-01-29: 8 mg via INTRAVENOUS

## 2013-01-29 MED ORDER — ONDANSETRON 8 MG/NS 50 ML IVPB
INTRAVENOUS | Status: AC
  Filled 2013-01-29: qty 8

## 2013-01-29 MED ORDER — DEXAMETHASONE SODIUM PHOSPHATE 10 MG/ML IJ SOLN
INTRAMUSCULAR | Status: AC
  Filled 2013-01-29: qty 1

## 2013-01-29 MED ORDER — DEXAMETHASONE SODIUM PHOSPHATE 10 MG/ML IJ SOLN
10.0000 mg | Freq: Once | INTRAMUSCULAR | Status: AC
  Administered 2013-01-29: 10 mg via INTRAVENOUS

## 2013-01-29 MED ORDER — SODIUM CHLORIDE 0.9 % IJ SOLN
10.0000 mL | INTRAMUSCULAR | Status: DC | PRN
  Administered 2013-01-29: 10 mL
  Filled 2013-01-29: qty 10

## 2013-01-29 MED ORDER — PACLITAXEL PROTEIN-BOUND CHEMO INJECTION 100 MG
80.0000 mg/m2 | Freq: Once | INTRAVENOUS | Status: AC
  Administered 2013-01-29: 175 mg via INTRAVENOUS
  Filled 2013-01-29: qty 35

## 2013-01-29 NOTE — Patient Instructions (Signed)
[facility] Discharge Instructions for Patients Receiving Chemotherapy  Today you received the following chemotherapy agents abraxane  To help prevent nausea and vomiting after your treatment, we encourage you to take your nausea medication as prescribed.   If you develop nausea and vomiting that is not controlled by your nausea medication, call the clinic.   BELOW ARE SYMPTOMS THAT SHOULD BE REPORTED IMMEDIATELY:  *FEVER GREATER THAN 100.5 F  *CHILLS WITH OR WITHOUT FEVER  NAUSEA AND VOMITING THAT IS NOT CONTROLLED WITH YOUR NAUSEA MEDICATION  *UNUSUAL SHORTNESS OF BREATH  *UNUSUAL BRUISING OR BLEEDING  TENDERNESS IN MOUTH AND THROAT WITH OR WITHOUT PRESENCE OF ULCERS  *URINARY PROBLEMS  *BOWEL PROBLEMS  UNUSUAL RASH Items with * indicate a potential emergency and should be followed up as soon as possible.  Feel free to call the clinic you have any questions or concerns. The clinic phone number is [phone].    

## 2013-01-29 NOTE — Progress Notes (Signed)
INR below goal today for unknown reason Pt seen in infusion area  She is doing well with no complaints She is shocked that her INR is so low today as she states nothing has changed and she has not missed any doses In the last 2 weeks she had a hospital encounter for her cellulitis and received antibiotics but she has been off these for the last week or so and her INR was at goal last week  No unusual bruising/bleeding no signs/symptoms of clotting No other medication or diet changes Dr. Truett Perna has been holding Theresa Summers' Gemzar and only treating with Abraxane single agent Case discussed with Dr. Truett Perna this morning and he feels patient does not need lovenox bridge at this time will only give patient a boost to her dose Plan:  Take 10 mg coumadin tonight and tomorrow  then Continue coumadin 5 mg daily except for 7.5 mg on Tues, Thurs, and Saturdays.  Return to clinic in 1 week with scheduled lab at 9:30 and infusion at 10am on 02/05/13. We will see her in infusion If INR remains low next visit will recommend increase in coumadin dose

## 2013-01-29 NOTE — Patient Instructions (Signed)
INR below goal today for unknown reason Take 10 mg tonight and tomorrow  then Continue coumadin 5 mg daily except for 7.5 mg on Tues, Thurs, and Saturdays.  Return to clinic in 1 week with scheduled lab at 9:30 and infusion at 10am on 02/05/13. We will see you in infusion

## 2013-01-30 ENCOUNTER — Telehealth: Payer: Self-pay | Admitting: *Deleted

## 2013-01-30 ENCOUNTER — Ambulatory Visit (INDEPENDENT_AMBULATORY_CARE_PROVIDER_SITE_OTHER): Payer: 59 | Admitting: Internal Medicine

## 2013-01-30 ENCOUNTER — Encounter: Payer: Self-pay | Admitting: Internal Medicine

## 2013-01-30 VITALS — BP 124/56 | HR 77 | Temp 97.8°F | Wt 216.2 lb

## 2013-01-30 DIAGNOSIS — R55 Syncope and collapse: Secondary | ICD-10-CM

## 2013-01-30 DIAGNOSIS — E538 Deficiency of other specified B group vitamins: Secondary | ICD-10-CM

## 2013-01-30 DIAGNOSIS — L03115 Cellulitis of right lower limb: Secondary | ICD-10-CM

## 2013-01-30 MED ORDER — TEMAZEPAM 30 MG PO CAPS
30.0000 mg | ORAL_CAPSULE | Freq: Every evening | ORAL | Status: DC | PRN
Start: 2013-01-30 — End: 2013-02-26

## 2013-01-30 MED ORDER — CYANOCOBALAMIN 1000 MCG/ML IJ SOLN
1000.0000 ug | Freq: Once | INTRAMUSCULAR | Status: AC
  Administered 2013-01-30: 1000 ug via INTRAMUSCULAR

## 2013-01-30 NOTE — Telephone Encounter (Signed)
Message from pt reporting insomnia. Restoril 15 mg no longer effective. Requests to change Rx or increase dose. Reviewed with Dr. Truett Perna: Order received to increase Restoril dose to 30 mg QHS PRN. Rx called to pharmacy, called pt with instructions. She voiced understanding.

## 2013-01-30 NOTE — Addendum Note (Signed)
Addended by: Lorre Munroe on: 01/30/2013 11:42 AM   Modules accepted: Level of Service

## 2013-01-30 NOTE — Progress Notes (Signed)
Pre-visit discussion using our clinic review tool. No additional management support is needed unless otherwise documented below in the visit note.  

## 2013-01-30 NOTE — Assessment & Plan Note (Signed)
Resolved with IV and oral antibiotics Hospital notes reviewed, time to review meds approx 15 minutes.  Continue to monitor

## 2013-01-30 NOTE — Progress Notes (Signed)
Subjective:    Patient ID: Theresa Summers, female    DOB: 03-Nov-1940, 72 y.o.   MRN: [id number]  HPI  Pt presents to the clinic today for TCM hospital follow up. She was admitted to the hospital on 01/14/13 for SOB and redness and swelling of the RLE. She had been receiving outpatient treatment for cellulitis but has been unresponsive to treatment. Her chest xray did not reveal pneumonia or PE. RLE ultrasound was negative for DVT. In the hospital, she was given IV antibiotics with good response. Since discharge on 01/16/2013, she has been doing well. The cellulitis has improved. She has finished her oral antibiotics. She did report that this past Sunday, she did pass out while out side. She did fall up against a tree. She is not sure if it was related to her sugar or not- she had not eaten anything all day up until that point. She did not lose consciousness, she denies chest pain, chest tightness or dizziness with that episode. She has not had any more episodes since that time.  Review of Systems      Past Medical History  Diagnosis Date  . Trigger finger   . Hyperlipidemia   . Diabetes mellitus   . Chronic venous insufficiency   . Morbid obesity   . Hypertension   . Thyroid disease   . GERD (gastroesophageal reflux disease)   . Hypothyroidism   . Arthritis   . Anemia   . Common bile duct dilatation 10/01/12  . Fatty liver 10/01/12  . Portal vein thrombosis   . Cholelithiasis   . Pancreatic lesion 10/01/12  . Hydroureteronephrosis     right  . Pancreatic mass 2014  . Cancer 10/17/12     pancreatic   . Allergy   . Anxiety   . Neuromuscular disorder     neuropathy    Current Outpatient Prescriptions  Medication Sig Dispense Refill  . acetaminophen (TYLENOL) 325 MG tablet Take 650 mg by mouth daily as needed for pain. For pain      . Biotin 5000 MCG CAPS Take 5,000 mcg by mouth at bedtime.       . clindamycin (CLEOCIN) 300 MG capsule Take 1 capsule (300 mg total) by mouth 3  (three) times daily.  21 capsule  0  . doxazosin (CARDURA) 8 MG tablet Take 8 mg by mouth every morning.      . escitalopram (LEXAPRO) 20 MG tablet Take 20 mg by mouth every morning.      . famotidine (PEPCID) 20 MG tablet Take 20 mg by mouth at bedtime.       . furosemide (LASIX) 40 MG tablet Take 40 mg by mouth every morning.       Marland Kitchen HYDROmorphone (DILAUDID) 2 MG tablet Take 0.5-1 tablets (1-2 mg total) by mouth every 4 (four) hours as needed.  50 tablet  0  . Insulin Glargine (LANTUS SOLOSTAR) 100 UNIT/ML SOPN Inject 70 Units into the skin every morning.      . Iron-FA-B12-Biotin-C-DSS-Mg-Zn (MAXFE) 160-1 MG TABS Take 1 tablet by mouth daily.  30 tablet  2  . levothyroxine (SYNTHROID, LEVOTHROID) 88 MCG tablet Take 88 mcg by mouth daily before breakfast. Name brand only      . lidocaine-prilocaine (EMLA) cream Apply 1 application topically as needed. Apply 1 teaspoon to PAC site 1-2 hours prior to stick and cover with plastic wrap to numb site. Do not rub cream in.  30 g  prn  . losartan (  COZAAR) 25 MG tablet Take 25 mg by mouth every morning.      . megestrol (MEGACE) 40 MG/ML suspension Take 200 mg by mouth daily. One teaspoon daily per patient      . ondansetron (ZOFRAN) 4 MG tablet TAKE 1 TABLET BY MOUTH EVERY 8 HOURS AS NEEDED FOR NAUSEA  20 tablet  2  . Polyethyl Glycol-Propyl Glycol (SYSTANE OP) Apply 1 drop to eye 3 (three) times daily as needed (dry eyes).      . potassium chloride (KLOR-CON) 20 MEQ packet Take 20 mEq by mouth every morning.       . pregabalin (LYRICA) 75 MG capsule Take 75-150 mg by mouth 2 (two) times daily. 75mg  in tje morning and 150mg  at bedtime      . prochlorperazine (COMPAZINE) 10 MG tablet Take 1 tablet (10 mg total) by mouth every 6 (six) hours as needed (nausea).  60 tablet  1  . simvastatin (ZOCOR) 20 MG tablet Take 20 mg by mouth at bedtime.      . temazepam (RESTORIL) 15 MG capsule TAKE ONE CAPSULE BY MOUTH AT BEDTIME AS NEEDED FOR SLEEP *DO NOT TAKE WITH  XANAX OR TRAZADONE*  30 capsule  2  . traMADol (ULTRAM) 50 MG tablet Take 50 mg by mouth every 6 (six) hours as needed for pain.      Marland Kitchen warfarin (COUMADIN) 10 MG tablet Take 10 mg by mouth daily. Tuesday, Thursday, Saturday 15mg . Sunday, Monday, wed., Friday 10mg .      . [DISCONTINUED] fesoterodine (TOVIAZ) 4 MG TB24 Take 1 tablet (4 mg total) by mouth at bedtime.  30 tablet  3   Current Facility-Administered Medications  Medication Dose Route Frequency Provider Last Rate Last Dose  . cyanocobalamin ((VITAMIN B-12)) injection 1,000 mcg  1,000 mcg Intramuscular Q30 days Hart Carwin, MD        Allergies  Allergen Reactions  . Oxycodone Hcl Nausea Only  . Latex Rash  . Zithromax [Azithromycin] Other (See Comments)    thrush    Family History  Problem Relation Age of Onset  . Heart disease Father   . Arthritis Father   . Breast cancer Mother   . Lymphoma Sister     #1  . Diabetes Sister     #1  . Diabetes Sister     #2  . Diabetes Brother     #1  . Diabetes Brother     #2  . Diabetes Other     siblings  . Hypertension Other     siblings  . Arthritis Other     siblings    History   Social History  . Marital Status: Married    Spouse Name: N/A    Number of Children: 2  . Years of Education: N/A   Occupational History  .     Social History Main Topics  . Smoking status: Never Smoker   . Smokeless tobacco: Never Used  . Alcohol Use: No  . Drug Use: No  . Sexual Activity: Yes   Other Topics Concern  . Not on file   Social History Narrative   Exercises 3-4 times a week   No caffeine   2 children - twins     Constitutional: Denies fever, malaise, fatigue, headache or abrupt weight changes.  Respiratory: Denies difficulty breathing, shortness of breath, cough or sputum production.   Cardiovascular: Denies chest pain, chest tightness, palpitations or swelling in the hands or feet.   Skin: Denies rashes,  lesions or ulcercations.  Neurological: Denies  dizziness, difficulty with memory, difficulty with speech or problems with balance and coordination.   No other specific complaints in a complete review of systems (except as listed in HPI above).  Objective:   Physical Exam  BP 124/56  Pulse 77  Temp(Src) 97.8 F (36.6 C) (Oral)  Wt 216 lb 4 oz (98.09 kg) Wt Readings from Last 3 Encounters:  01/30/13 216 lb 4 oz (98.09 kg)  01/22/13 221 lb 4.8 oz (100.381 kg)  01/15/13 223 lb 4.8 oz (101.288 kg)    General: Appears her stated age, well developed, well nourished in NAD. Skin: Warm, dry and intact. No rashes, lesions or ulcerations noted. PVD changes noted on BLE. Cardiovascular: Normal rate and rhythm. S1,S2 noted.  No murmur, rubs or gallops noted. No JVD, 2+ edema noted on BLE. No carotid bruits noted. Pulmonary/Chest: Normal effort and positive vesicular breath sounds. No respiratory distress. No wheezes, rales or ronchi noted.      BMET    Component Value Date/Time   NA 142 01/22/2013 1122   NA 141 01/15/2013 0549   K 3.6 01/22/2013 1122   K 3.0* 01/15/2013 0549   CL 104 01/15/2013 0549   CO2 26 01/22/2013 1122   CO2 28 01/15/2013 0549   GLUCOSE 179* 01/22/2013 1122   GLUCOSE 101* 01/15/2013 0549   BUN 5.1* 01/22/2013 1122   BUN 7 01/15/2013 0549   CREATININE 0.6 01/22/2013 1122   CREATININE 0.52 01/15/2013 0549   CALCIUM 8.8 01/22/2013 1122   CALCIUM 8.5 01/15/2013 0549   GFRNONAA >90 01/15/2013 0549   GFRAA >90 01/15/2013 0549    Lipid Panel     Component Value Date/Time   CHOL 159 07/26/2006 1011   TRIG 146 07/26/2006 1011   HDL 40.4 07/26/2006 1011   CHOLHDL 3.9 CALC 07/26/2006 1011   VLDL 29 07/26/2006 1011   LDLCALC 89 07/26/2006 1011    CBC    Component Value Date/Time   WBC 4.4 01/29/2013 1035   WBC 3.3* 01/15/2013 0549   RBC 3.94 01/29/2013 1035   RBC 3.02* 01/15/2013 0549   RBC 3.31* 10/02/2012 2350   HGB 10.4* 01/29/2013 1035   HGB 8.1* 01/15/2013 0549   HCT 33.2* 01/29/2013 1035   HCT 24.7* 01/15/2013  0549   PLT 246 01/29/2013 1035   PLT 212 01/15/2013 0549   MCV 84.3 01/29/2013 1035   MCV 81.8 01/15/2013 0549   MCH 26.4 01/29/2013 1035   MCH 26.8 01/15/2013 0549   MCHC 31.3* 01/29/2013 1035   MCHC 32.8 01/15/2013 0549   RDW 19.2* 01/29/2013 1035   RDW 18.4* 01/15/2013 0549   LYMPHSABS 1.3 01/29/2013 1035   LYMPHSABS 0.6* 01/14/2013 1809   MONOABS 0.3 01/29/2013 1035   MONOABS 0.4 01/14/2013 1809   EOSABS 0.2 01/29/2013 1035   EOSABS 0.1 01/14/2013 1809   BASOSABS 0.1 01/29/2013 1035   BASOSABS 0.0 01/14/2013 1809    Hgb A1C Lab Results  Component Value Date   HGBA1C 6.9* 01/15/2013         Assessment & Plan:   Syncopal episode likely due to hypoglycemia:  Monitor for reoccurring events If happens again, have someone check your blood sugar at that time Remember to eat small frequent meals  RTC in 1 month for B12 injection

## 2013-01-30 NOTE — Patient Instructions (Signed)
Cellulitis Cellulitis is an infection of the skin and the tissue beneath it. The infected area is usually red and tender. Cellulitis occurs most often in the arms and lower legs.  CAUSES  Cellulitis is caused by bacteria that enter the skin through cracks or cuts in the skin. The most common types of bacteria that cause cellulitis are Staphylococcus and Streptococcus. SYMPTOMS   Redness and warmth.  Swelling.  Tenderness or pain.  Fever. DIAGNOSIS  Your caregiver can usually determine what is wrong based on a physical exam. Blood tests may also be done. TREATMENT  Treatment usually involves taking an antibiotic medicine. HOME CARE INSTRUCTIONS   Take your antibiotics as directed. Finish them even if you start to feel better.  Keep the infected arm or leg elevated to reduce swelling.  Apply a warm cloth to the affected area up to 4 times per day to relieve pain.  Only take over-the-counter or prescription medicines for pain, discomfort, or fever as directed by your caregiver.  Keep all follow-up appointments as directed by your caregiver. SEEK MEDICAL CARE IF:   You notice red streaks coming from the infected area.  Your red area gets larger or turns dark in color.  Your bone or joint underneath the infected area becomes painful after the skin has healed.  Your infection returns in the same area or another area.  You notice a swollen bump in the infected area.  You develop new symptoms. SEEK IMMEDIATE MEDICAL CARE IF:   You have a fever.  You feel very sleepy.  You develop vomiting or diarrhea.  You have a general ill feeling (malaise) with muscle aches and pains. MAKE SURE YOU:   Understand these instructions.  Will watch your condition.  Will get help right away if you are not doing well or get worse. Document Released: 11/16/2004 Document Revised: 08/08/2011 Document Reviewed: 04/24/2011 [employer] Patient Information 2014 [employer].  

## 2013-01-31 ENCOUNTER — Ambulatory Visit (INDEPENDENT_AMBULATORY_CARE_PROVIDER_SITE_OTHER): Payer: 59 | Admitting: Internal Medicine

## 2013-01-31 ENCOUNTER — Encounter: Payer: Self-pay | Admitting: Internal Medicine

## 2013-01-31 VITALS — BP 100/52 | HR 68 | Ht 66.5 in | Wt 213.6 lb

## 2013-01-31 DIAGNOSIS — C250 Malignant neoplasm of head of pancreas: Secondary | ICD-10-CM

## 2013-01-31 DIAGNOSIS — I81 Portal vein thrombosis: Secondary | ICD-10-CM

## 2013-01-31 NOTE — Progress Notes (Signed)
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 The tumor measures 1.8x1.6 cm. 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 She has been under chemotherapy by Dr. Truett Perna. She has been evaluated for surgical resection by Dr Jearld Fenton and is not considered asurgical candidate because of vascular invasion. She is currently on Coumadin and has a therapeutic INR. She has peripheral edema and has been treated for cellulitis of the right leg. She has  right hydronephrosis. She is not currently on any pain medications although she has Dilaudid and Tramadol. Her weight has decreased from 221 pounds in September to 219 pounds in October and 213 pounds today. She denies bloating, greasy stools or diarrhea. She has been scheduled for a restaging CT scan on 02/10/2013. She takes Pepcid when necessary for acid reflux. She has no specific complaints other than a decreased level of energy. She has continued on iron supplements.  Past Medical History  Diagnosis Date  . Trigger finger   . Hyperlipidemia   . Diabetes mellitus   . Chronic venous insufficiency   . Morbid obesity   . Hypertension   . Thyroid disease   . GERD (gastroesophageal reflux disease)   . Hypothyroidism   . Arthritis   . Anemia   . Common bile duct dilatation 10/01/12  . Fatty liver 10/01/12  . Portal vein thrombosis   . Cholelithiasis   . Pancreatic lesion 10/01/12  . Hydroureteronephrosis     right  . Pancreatic mass 2014  . Cancer 10/17/12     pancreatic   . Allergy   . Anxiety   . Neuromuscular disorder     neuropathy    Past Surgical History  Procedure Laterality Date  . Lumbar laminectomy    . Breast biopsy    . Eus N/A 10/17/2012    Procedure: UPPER ENDOSCOPIC ULTRASOUND (EUS) LINEAR;   Surgeon: Rachael Fee, MD;  Location: [facility];  Service: Endoscopy;  Laterality: N/A;  . Portacath placement Left 11/19/2012    Procedure: INSERTION PORT-A-CATH;  Surgeon: Almond Lint, MD;  Location: [facility] ORS;  Service: General;  Laterality: Left;    Allergies  Allergen Reactions  . Oxycodone Hcl Nausea Only  . Latex Rash  . Zithromax [Azithromycin] Other (See Comments)    thrush    Review of Systems: Denies dysphagia, heartburn, abdominal pain  The remainder of the 10 point ROS is negative except as outlined in the H&P  Physical Exam: General Appearance Well developed, in no distress Eyes  Non icteric  HEENT  Non traumatic, normocephalic  Mouth No lesion, tongue papillated, no cheilosis Neck Supple without adenopathy, thyroid not enlarged, no carotid bruits, no JVD Lungs Clear to auscultation bilaterally COR Normal S1, normal S2, regular rhythm, no murmur, quiet precordium Abdomen Soft, nontender with normoactive bowel sounds no palpable mass. Rectal not done Extremities 3+ pedal edema with pitting  Skin No lesions Neurological Alert and oriented x 3 Psychological Normal mood and affect  Assessment and Plan:   Problem #76 72 year old white female with a small adenocarcinoma in the head of the pancreas which responded to chemotherapy. Her CA 19-9 tumor marker has decreased from 12,907 to 257. She is going back for chemotherapy and a restaging CT scan on 02/10/2013. She shows no signs of pancreatic insufficiency at  this point. I do not need to see her on a regular basis since she is closely followed by Dr Truett Perna.. I will see her on an as necessary basis only. Problem #2 colorectal screening- she has never had a colonoscopy, but this is not the appropriate time to do that till she is through  With Chemotherapy   Lina Sar 01/31/2013

## 2013-01-31 NOTE — Patient Instructions (Signed)
Dr. Sherrill  

## 2013-02-05 ENCOUNTER — Ambulatory Visit (HOSPITAL_BASED_OUTPATIENT_CLINIC_OR_DEPARTMENT_OTHER): Payer: 59 | Admitting: Pharmacist

## 2013-02-05 ENCOUNTER — Ambulatory Visit (HOSPITAL_BASED_OUTPATIENT_CLINIC_OR_DEPARTMENT_OTHER): Payer: 59

## 2013-02-05 ENCOUNTER — Other Ambulatory Visit (HOSPITAL_BASED_OUTPATIENT_CLINIC_OR_DEPARTMENT_OTHER): Payer: 59

## 2013-02-05 ENCOUNTER — Ambulatory Visit: Payer: 59 | Admitting: Nutrition

## 2013-02-05 DIAGNOSIS — C259 Malignant neoplasm of pancreas, unspecified: Secondary | ICD-10-CM

## 2013-02-05 DIAGNOSIS — N1330 Unspecified hydronephrosis: Secondary | ICD-10-CM

## 2013-02-05 DIAGNOSIS — C250 Malignant neoplasm of head of pancreas: Secondary | ICD-10-CM

## 2013-02-05 DIAGNOSIS — I81 Portal vein thrombosis: Secondary | ICD-10-CM

## 2013-02-05 DIAGNOSIS — Z5111 Encounter for antineoplastic chemotherapy: Secondary | ICD-10-CM

## 2013-02-05 LAB — CBC WITH DIFFERENTIAL/PLATELET
BASO%: 1.7 % (ref 0.0–2.0)
Eosinophils Absolute: 0.1 10*3/uL (ref 0.0–0.5)
HCT: 33 % — ABNORMAL LOW (ref 34.8–46.6)
HGB: 10.4 g/dL — ABNORMAL LOW (ref 11.6–15.9)
LYMPH%: 30.8 % (ref 14.0–49.7)
MCHC: 31.5 g/dL (ref 31.5–36.0)
MONO#: 0.3 10*3/uL (ref 0.1–0.9)
NEUT#: 2 10*3/uL (ref 1.5–6.5)
NEUT%: 56.8 % (ref 38.4–76.8)
Platelets: 170 10*3/uL (ref 145–400)
WBC: 3.6 10*3/uL — ABNORMAL LOW (ref 3.9–10.3)
lymph#: 1.1 10*3/uL (ref 0.9–3.3)

## 2013-02-05 LAB — PROTIME-INR
INR: 1.2 — ABNORMAL LOW (ref 2.00–3.50)
Protime: 14.4 Seconds — ABNORMAL HIGH (ref 10.6–13.4)

## 2013-02-05 LAB — POCT INR: INR: 1.2

## 2013-02-05 MED ORDER — ONDANSETRON 8 MG/50ML IVPB (CHCC)
8.0000 mg | Freq: Once | INTRAVENOUS | Status: AC
  Administered 2013-02-05: 8 mg via INTRAVENOUS

## 2013-02-05 MED ORDER — SODIUM CHLORIDE 0.9 % IV SOLN
Freq: Once | INTRAVENOUS | Status: AC
  Administered 2013-02-05: 10:00:00 via INTRAVENOUS

## 2013-02-05 MED ORDER — PACLITAXEL PROTEIN-BOUND CHEMO INJECTION 100 MG
80.0000 mg/m2 | Freq: Once | INTRAVENOUS | Status: AC
  Administered 2013-02-05: 175 mg via INTRAVENOUS
  Filled 2013-02-05: qty 35

## 2013-02-05 MED ORDER — ONDANSETRON 8 MG/NS 50 ML IVPB
INTRAVENOUS | Status: AC
  Filled 2013-02-05: qty 8

## 2013-02-05 MED ORDER — DEXAMETHASONE SODIUM PHOSPHATE 10 MG/ML IJ SOLN
INTRAMUSCULAR | Status: AC
  Filled 2013-02-05: qty 1

## 2013-02-05 MED ORDER — HEPARIN SOD (PORK) LOCK FLUSH 100 UNIT/ML IV SOLN
500.0000 [IU] | Freq: Once | INTRAVENOUS | Status: AC | PRN
  Administered 2013-02-05: 500 [IU]
  Filled 2013-02-05: qty 5

## 2013-02-05 MED ORDER — SODIUM CHLORIDE 0.9 % IJ SOLN
10.0000 mL | INTRAMUSCULAR | Status: DC | PRN
  Administered 2013-02-05: 10 mL
  Filled 2013-02-05: qty 10

## 2013-02-05 MED ORDER — DEXAMETHASONE SODIUM PHOSPHATE 10 MG/ML IJ SOLN
10.0000 mg | Freq: Once | INTRAMUSCULAR | Status: AC
  Administered 2013-02-05: 10 mg via INTRAVENOUS

## 2013-02-05 NOTE — Progress Notes (Signed)
Patient reports she feels well.  She has been eating well.  I noted patient has no signs of pancreatic insufficiency per M.D..  Weight has decreased and was documented as 213.6 pounds December 12 from 227.5 pounds November 19.  Patient is unsure why she has lost weight.  Patient has history of 3+ pedal edema.  Noted, albumin of 3.0, and glucose 179 on December 3.  Nutrition diagnosis: Unintended weight loss continues.  Intervention: Patient educated to continue healthy, plant-based diet with increased protein sources.  I recommended specific, high-protein snacks patient should be including on a daily basis.  Patient able to teach back strategies for increasing protein.  Questions were answered.  Monitoring, evaluation, goals: Patient will tolerate adequate calories and protein to promote weight maintenance.  Next visit: Wednesday, December 31, in chemotherapy.

## 2013-02-06 NOTE — Patient Instructions (Signed)
Take 10 mg tonight then increase coumadin to 7.5 mg daily except 5mg  on M&W.  Return to clinic in 1 week on 02/12/13: lab at 9am, MD at 9:30am and CC at 10am.

## 2013-02-06 NOTE — Progress Notes (Signed)
Pt seen in infusion area. INR remains below goal today. INR unchanged from previous INR last week. Pt took coumadin as instructed. No missed doses. No changes in diet or medications. She stated she is eating more food and more frequently than she was when she first started coumadin. No problems regarding anticoagulation. She has mild SOB with activity/exertion. Will increase coumadin dose. Take 10 mg tonight then increase coumadin to 7.5 mg daily except 5mg  on M&W.  Return to clinic in 1 week on 02/12/13: lab at 9am, MD at 9:30am and CC at 10am. No charge encounter.

## 2013-02-07 ENCOUNTER — Ambulatory Visit: Payer: 59

## 2013-02-10 ENCOUNTER — Encounter (HOSPITAL_COMMUNITY): Payer: Self-pay

## 2013-02-10 ENCOUNTER — Ambulatory Visit (HOSPITAL_COMMUNITY): Payer: 59

## 2013-02-10 ENCOUNTER — Ambulatory Visit (HOSPITAL_COMMUNITY)
Admission: RE | Admit: 2013-02-10 | Discharge: 2013-02-10 | Disposition: A | Discharge status: Home / Self Care | Payer: 59 | Source: Ambulatory Visit | Attending: Nurse Practitioner | Admitting: Nurse Practitioner

## 2013-02-10 ENCOUNTER — Ambulatory Visit: Payer: 59

## 2013-02-10 DIAGNOSIS — C259 Malignant neoplasm of pancreas, unspecified: Secondary | ICD-10-CM | POA: Insufficient documentation

## 2013-02-10 DIAGNOSIS — K862 Cyst of pancreas: Secondary | ICD-10-CM | POA: Insufficient documentation

## 2013-02-10 DIAGNOSIS — I81 Portal vein thrombosis: Secondary | ICD-10-CM | POA: Insufficient documentation

## 2013-02-10 DIAGNOSIS — Q619 Cystic kidney disease, unspecified: Secondary | ICD-10-CM | POA: Insufficient documentation

## 2013-02-10 DIAGNOSIS — K7689 Other specified diseases of liver: Secondary | ICD-10-CM | POA: Insufficient documentation

## 2013-02-10 MED ORDER — IOHEXOL 300 MG/ML  SOLN
100.0000 mL | Freq: Once | INTRAMUSCULAR | Status: AC | PRN
  Administered 2013-02-10: 100 mL via INTRAVENOUS

## 2013-02-11 ENCOUNTER — Ambulatory Visit: Payer: 59 | Admitting: Internal Medicine

## 2013-02-12 ENCOUNTER — Telehealth: Payer: Self-pay | Admitting: Oncology

## 2013-02-12 ENCOUNTER — Ambulatory Visit: Payer: 59 | Admitting: Pharmacist

## 2013-02-12 ENCOUNTER — Other Ambulatory Visit (HOSPITAL_BASED_OUTPATIENT_CLINIC_OR_DEPARTMENT_OTHER): Payer: 59

## 2013-02-12 ENCOUNTER — Ambulatory Visit (HOSPITAL_BASED_OUTPATIENT_CLINIC_OR_DEPARTMENT_OTHER): Payer: 59 | Admitting: Oncology

## 2013-02-12 VITALS — BP 162/56 | HR 87 | Temp 98.7°F | Resp 18 | Ht 66.0 in | Wt 219.6 lb

## 2013-02-12 DIAGNOSIS — N1330 Unspecified hydronephrosis: Secondary | ICD-10-CM

## 2013-02-12 DIAGNOSIS — I81 Portal vein thrombosis: Secondary | ICD-10-CM

## 2013-02-12 DIAGNOSIS — Z7901 Long term (current) use of anticoagulants: Secondary | ICD-10-CM

## 2013-02-12 DIAGNOSIS — D6481 Anemia due to antineoplastic chemotherapy: Secondary | ICD-10-CM

## 2013-02-12 DIAGNOSIS — C259 Malignant neoplasm of pancreas, unspecified: Secondary | ICD-10-CM

## 2013-02-12 DIAGNOSIS — E1142 Type 2 diabetes mellitus with diabetic polyneuropathy: Secondary | ICD-10-CM

## 2013-02-12 DIAGNOSIS — E1149 Type 2 diabetes mellitus with other diabetic neurological complication: Secondary | ICD-10-CM

## 2013-02-12 DIAGNOSIS — C250 Malignant neoplasm of head of pancreas: Secondary | ICD-10-CM

## 2013-02-12 DIAGNOSIS — D638 Anemia in other chronic diseases classified elsewhere: Secondary | ICD-10-CM

## 2013-02-12 LAB — PROTIME-INR
INR: 1.4 — ABNORMAL LOW (ref 2.00–3.50)
Protime: 16.8 Seconds — ABNORMAL HIGH (ref 10.6–13.4)

## 2013-02-12 LAB — CBC WITH DIFFERENTIAL/PLATELET
Basophils Absolute: 0.1 10*3/uL (ref 0.0–0.1)
Eosinophils Absolute: 0.1 10*3/uL (ref 0.0–0.5)
HGB: 10.4 g/dL — ABNORMAL LOW (ref 11.6–15.9)
MCHC: 32.1 g/dL (ref 31.5–36.0)
MCV: 85.5 fL (ref 79.5–101.0)
MONO%: 7.1 % (ref 0.0–14.0)
NEUT#: 2.2 10*3/uL (ref 1.5–6.5)
Platelets: 146 10*3/uL (ref 145–400)
RDW: 21.8 % — ABNORMAL HIGH (ref 11.2–14.5)

## 2013-02-12 LAB — COMPREHENSIVE METABOLIC PANEL (CC13)
AST: 23 U/L (ref 5–34)
Albumin: 3.4 g/dL — ABNORMAL LOW (ref 3.5–5.0)
Alkaline Phosphatase: 57 U/L (ref 40–150)
Anion Gap: 10 mEq/L (ref 3–11)
BUN: 7.2 mg/dL (ref 7.0–26.0)
Calcium: 9.1 mg/dL (ref 8.4–10.4)
Creatinine: 0.7 mg/dL (ref 0.6–1.1)
Glucose: 224 mg/dl — ABNORMAL HIGH (ref 70–140)
Potassium: 4.3 mEq/L (ref 3.5–5.1)

## 2013-02-12 LAB — CANCER ANTIGEN 19-9: CA 19-9: 365.3 U/mL — ABNORMAL HIGH (ref <35.0)

## 2013-02-12 NOTE — Patient Instructions (Signed)
Take 10 mg today (12/24) and 10mg  on 12/25.  Then increase coumadin to 7.5 mg daily on 12/26.  Return to clinic in 1 week on 02/19/13: lab at 10:30am and CC at 10:45am.

## 2013-02-12 NOTE — Telephone Encounter (Signed)
Gave pt appt for lab,MD coumadin, for 02/19/13

## 2013-02-12 NOTE — Progress Notes (Signed)
INR remains below goal today. Slight increase from INR on 02/05/13. Pt took coumadin as instructed. No missed doses. Appetite improving. Diet unchanged.  Pt no longer taking an iron supplement.  She also stopped Megace. Medication list updated. No concerns regarding anticoagulation. Pt will be starting Xeloda and XRT in the next few weeks. Pt educated on the interaction between Coumadin and Xeloda. She is aware we will monitor closely and more frequently. Her Coumadin dose will most likely need dose reduction once Xeloda is started. No Lovenox coverage at this time per Dr. Truett Perna. Will increase coumadin dose for now. Take 10 mg today (12/24) and 10mg  on 12/25.  Then increase coumadin to 7.5 mg daily on 12/26.  Return to clinic in 1 week on 02/19/13: lab at 10:30am and CC at 10:45am.

## 2013-02-12 NOTE — Progress Notes (Signed)
[facility]    OFFICE PROGRESS NOTE   INTERVAL HISTORY:   She returns for scheduled followup of pancreas cancer. She was last treated with Abraxane 02/05/2013.no change in the neuropathy symptoms at the feet. No abdominal pain. Good appetite. She continues to work 2 days per week.  The leg erythema and pain have resolved.  Objective:  Vital signs in last 24 hours:  Blood pressure 162/56, pulse 87, temperature 98.7 F (37.1 C), temperature source Oral, resp. rate 18, height 5\' 6"  (1.676 m), weight 219 lb 9.6 oz (99.61 kg).    HEENT: no thrush or ulcer Resp: lungs clear bilaterally Cardio: regular rate and rhythm GI: no hepatomegaly, nontender, no mass Vascular: no leg edema.  Skin:chronic stasis change at the low leg bilaterally with light brown discoloration of the right lower leg   Portacath/PICC-without erythema  Lab Results:  Lab Results  Component Value Date   WBC 3.4* 02/12/2013   HGB 10.4* 02/12/2013   HCT 32.3* 02/12/2013   MCV 85.5 02/12/2013   PLT 146 02/12/2013  ANC 2.2  PT/INR 1.4  CA 19-9 on 01/22/2013-257  X-rays: CTs of the chest, abdomen, and pelvis on 02/10/2013:the pancreas mass now measures 15 mm x 15 mm compared to a 9 mm x 11 mm per thrombus in the main portal vein and superior mesenteric vein-unchanged. No peripancreatic adenopathy.no evidence of thoracic metastases. No evidence of liver metastases.  Medications: I have reviewed the patient's current medications.  Assessment/Plan: 1. Adenocarcinoma pancreas, pancreas head mass, endoscopic ultrasound on 10/17/2012 confirmed a pancreas head mass without vascular invasion or surrounding lymphadenopathy, biopsy positive for adenocarcinoma.  Elevated CA 19-9.  Initiation of gemcitabine/Abraxane on a three out of four week schedule on 11/21/2012.  CA 19-9 improved at 571 on 12/25/2012. CA 19-9 improved a t257 on 01/22/2013 Restaging CT 02/10/2013 with an increase in the pancreas  mass and no evidence of progressive metastatic disease 2. 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 She is maintained on Coumadin.  3. Diabetes.  4. Right hydronephrosis-? Related to history of kidney stones versus carcinomatosis.  5. "Cirrhotic" changes and splenomegaly reported on the chest CT 10/25/2012.  6. History of kidney stones.  7. Hypothyroidism.  8. Normocytic anemia-secondary to chronic disease and chemotherapy , status post a red cell transfusion 01/15/2013.  9. 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  10. Pain secondary to pancreas cancer-. She denies abdominal pain at today's visit.  11. Pain/tenderness, edema, erythema right lower leg,resolved 12/25/2012-negative venous duplex 12/25/2012, 01/08/2013 and 01/14/2013. No improvement with multiple courses of antibiotics. Question pseudo-cellulitis secondary to gemcitabine.  12. History ofPancytopenia secondary to chemotherapy.    Disposition:  She has completed 9 treatments with chemotherapy. Gemcitabine was held from the last 3 treatments secondary to the likelihood of pseudo-cellulitis at the right lower extremity.  I discussed the restaging CT findings with Ms. Theresa Summers and her family. The pancreas mass is larger, but there is no evidence of metastatic disease. We discussed observation, continuing chemotherapy, or chemotherapy/radiation. The goal of combined chemotherapy and radiation is to maintain palliation of her pain and potentially prolong the time for disease progression.  We will refer her to Dr. Mitzi Hansen. I recommend concurrent capecitabine and radiation. We reviewed the specific toxicities associated with capecitabine including the chance for mucositis, diarrhea, rash, hyperpigmentation, and the hand/foot syndrome. She was given reading materials on capecitabine. She will take capecitabine on the days of  radiation only.  She will return for an office visit approximately 2 weeks into the combined therapy. The Coumadin dose was adjusted by the [facility] anticoagulation clinic today.     Thornton Papas, MD  02/12/2013  4:48 PM

## 2013-02-12 NOTE — Progress Notes (Signed)
Met with patient and family to assess for needs.  Patient has a very good support system and denies needs or services from support team at this time.  Radiation appointment with Dr. Mitzi Hansen was coordinated.  Patient name sent to pharmacist for f/u on new start of Xeloda.  Patient given emotional support.

## 2013-02-12 NOTE — Progress Notes (Signed)
Reviewed with patient capecitabine-how to take, precautions and side effects and management using Teach Back method. Consent signed.

## 2013-02-14 ENCOUNTER — Other Ambulatory Visit: Payer: Self-pay | Admitting: *Deleted

## 2013-02-14 ENCOUNTER — Telehealth: Payer: Self-pay | Admitting: *Deleted

## 2013-02-14 DIAGNOSIS — L02419 Cutaneous abscess of limb, unspecified: Secondary | ICD-10-CM

## 2013-02-14 DIAGNOSIS — R55 Syncope and collapse: Secondary | ICD-10-CM

## 2013-02-14 MED ORDER — CAPECITABINE 500 MG PO TABS: 1500.0000 mg | ORAL_TABLET | Freq: Two times a day (BID) | ORAL | Status: DC

## 2013-02-14 NOTE — Telephone Encounter (Signed)
Message copied by Caleb Popp on Fri Feb 14, 2013  4:03 PM ------      Message from: Thornton Papas B      Created: Fri Feb 14, 2013  3:52 PM       Please call patient, ca19-9 is slightly higher, but better than prior to therapy, proceed with xeloda/xrt ------

## 2013-02-16 ENCOUNTER — Other Ambulatory Visit: Payer: Self-pay | Admitting: Oncology

## 2013-02-17 ENCOUNTER — Encounter: Payer: Self-pay | Admitting: Oncology

## 2013-02-17 NOTE — Progress Notes (Signed)
Faxed xeloda prescription to [facility]. °

## 2013-02-17 NOTE — Progress Notes (Signed)
GI Location of Tumor / Histology: Pancreas FUNC seen in September 2014  Patient presented   months ago with symptoms of: abdominal pain 1 -2 month before DX   Biopsies of  (if applicable) revealed: 10/17/12: Diagnosis FINE NEEDLE ASPIRATION, ENDOSCOPIC, PANCREAS HEAD:ABUNDANT NECROSIS AND MALIGNANT GLANDULAR CELLS, CONSISTENT WITH ADENOCARCINOMA.  Past/Anticipated interventions by surgeon, if any: Common bile dilatation 10/01/12,pancreatic lesion bx'd ,EUS 10/17/12  Past/Anticipated interventions by medical oncology, if any: Initiation of gemcitabine/Abraxane on a three out of four week schedule on 11/21/2012. CA 19-9 improved at 571 on 12/25/2012. CA 19-9 improved a t257 on 01/22/2013 Restaging CT 02/10/2013 with an increase in the pancreas mass and no evidence of progressive metastatic disease, last treated Abraxane 02/05/13, 9 treatments completed with Chemotherapy, Gemcitabine was held last 3 treatments secondary to likelihood of pseudo-cellulitis at right lower extremity, no DVT  Weight changes, if any: loss from 10/22/12 wt=222.9lb to 02/12/13 wt=219..9lb  Bowel/Bladder complaints, if any:no regular bowel movements  Nausea / Vomiting, if any: no  Pain issues, if any:   None at present   SAFETY ISSUES:  Prior radiation? No  Pacemaker/ICD? No  Possible current pregnancy? No  Is the patient on methotrexate? No  Current Complaints / other details:   Married, 2 children(twins)mother postmenopausal breast cancer & cervical cancer, sister -Lymphoma,father heart disease, non smoker/no alcohol or illicit drug use Has portal vein thrombosis,stable

## 2013-02-18 ENCOUNTER — Encounter: Payer: Self-pay | Admitting: Oncology

## 2013-02-19 ENCOUNTER — Other Ambulatory Visit (HOSPITAL_BASED_OUTPATIENT_CLINIC_OR_DEPARTMENT_OTHER): Payer: 59

## 2013-02-19 ENCOUNTER — Encounter: Payer: Self-pay | Admitting: Radiation Oncology

## 2013-02-19 ENCOUNTER — Encounter: Payer: 59 | Admitting: Nutrition

## 2013-02-19 ENCOUNTER — Ambulatory Visit
Admission: RE | Admit: 2013-02-19 | Discharge: 2013-02-19 | Disposition: A | Discharge status: Home / Self Care | Payer: 59 | Source: Ambulatory Visit | Attending: Radiation Oncology | Admitting: Radiation Oncology

## 2013-02-19 ENCOUNTER — Ambulatory Visit: Payer: 59

## 2013-02-19 ENCOUNTER — Ambulatory Visit: Payer: 59 | Admitting: Pharmacist

## 2013-02-19 ENCOUNTER — Ambulatory Visit: Payer: 59 | Admitting: Nutrition

## 2013-02-19 VITALS — BP 143/60 | HR 85 | Temp 98.7°F | Resp 16 | Ht 66.0 in | Wt 222.3 lb

## 2013-02-19 DIAGNOSIS — K869 Disease of pancreas, unspecified: Secondary | ICD-10-CM | POA: Insufficient documentation

## 2013-02-19 DIAGNOSIS — K8020 Calculus of gallbladder without cholecystitis without obstruction: Secondary | ICD-10-CM | POA: Insufficient documentation

## 2013-02-19 DIAGNOSIS — C259 Malignant neoplasm of pancreas, unspecified: Secondary | ICD-10-CM | POA: Insufficient documentation

## 2013-02-19 DIAGNOSIS — I81 Portal vein thrombosis: Secondary | ICD-10-CM | POA: Insufficient documentation

## 2013-02-19 DIAGNOSIS — Z7901 Long term (current) use of anticoagulants: Secondary | ICD-10-CM

## 2013-02-19 DIAGNOSIS — N281 Cyst of kidney, acquired: Secondary | ICD-10-CM | POA: Insufficient documentation

## 2013-02-19 DIAGNOSIS — C250 Malignant neoplasm of head of pancreas: Secondary | ICD-10-CM

## 2013-02-19 DIAGNOSIS — Z79899 Other long term (current) drug therapy: Secondary | ICD-10-CM | POA: Insufficient documentation

## 2013-02-19 DIAGNOSIS — N1330 Unspecified hydronephrosis: Secondary | ICD-10-CM

## 2013-02-19 LAB — PROTIME-INR: INR: 1.9 — ABNORMAL LOW (ref 2.00–3.50)

## 2013-02-19 LAB — POCT INR: INR: 1.9

## 2013-02-19 NOTE — Progress Notes (Signed)
Patient feels well.  She is looking for strategies on how to eat during radiation therapy.  She is concerned about fatigue.  Weight has stabilized and was documented as 222.3 pounds, increased from 213.6 pounds on December 12.  Nutrition diagnosis:  Unintended weight loss improved.  Intervention:  Patient educated to consume nutrient dense foods in small amounts throughout the day.  Encouraged increased fluid intake.  Recommended patient continue light activity.  Questions answered.  Teach back method used.  Monitoring, evaluation, goals:  Patient will tolerate nutrient rich foods to promote weight maintenance.  Next Visit: Patient will contact me for follow up once her radiation schedule is determined.

## 2013-02-19 NOTE — Progress Notes (Signed)
Please see the Nurse Progress Note in the MD Initial Consult Encounter for this patient. 

## 2013-02-19 NOTE — Patient Instructions (Signed)
Take 7.5mg  daily except 10mg  on Wed & Sat.  Recheck INR in 1 week on 02/26/13; lab at 1pm and coumadin clinic at 1:15pm. We can coordinate with your CT scan appt (once scheduled).

## 2013-02-19 NOTE — Progress Notes (Signed)
INR essentially at goal today. INR = 1.9, Goal INR = 2-3 No changes to report. Pt took coumadin as instructed. No problems regarding anticoagulation. No s/s of clotting noted. Appetite is "too good". Pt may no longer be starting on Xeloda. She will let us know if she does. Take 7.5mg  daily except 10mg  on Wed & Sat.  Recheck INR in 1 week on 02/26/13; lab at 1pm and coumadin clinic at 1:15pm. We can coordinate with CT scan appt (once scheduled).

## 2013-02-21 ENCOUNTER — Telehealth: Payer: Self-pay

## 2013-02-21 NOTE — Telephone Encounter (Signed)
Patient has been informed not to pick of chemo medication as Dr.Moody has corresponded with Dr.Sherrill and the plan is to have radiation without the chemo (xeloda)Called patient.Patient voiced understanding.Answered patient's questions regarding contrast.Bun and creatinine are within normal range.Understands she will have oral and iv contrast.No allergies to contrast dye.Takes only insulin no metformin.Knows to allow about 2 hours for being in office.May bring husband but he will sit in waiting room.

## 2013-02-23 NOTE — Progress Notes (Signed)
Radiation Oncology         [phone] ________________________________  Name: MARRIANA HIBBERD MRN: [id number]  Date: 02/19/2013  DOB: 03/13/1940  Follow-Up Visit Note  CC: Arnette Norris, MD  Ladell Pier, MD  Diagnosis:   Pancreatic cancer  Interval Since Last Radiation:  Not applicable   Narrative:  The patient returns today for  follow-up.  The patient's case was initially discussed at multidisciplinary GI conference. She was unable to proceed with surgical resection. She therefore proceeded to undergo chemotherapy consisting of gemcitabine/abraxane.  The patient was noted to have a decrease in the tumor marker level. She then had a restaging CT scan performed on 02/10/2013. No evidence of metastatic disease. The tumor did appear to increase however in size versus initial staging study. She has discussed possible treatment options with medical oncology and she presents today for further discussion of radiation treatment.                              ALLERGIES:  is allergic to oxycodone hcl; latex; and zithromax.  Meds: Current Outpatient Prescriptions  Medication Sig Dispense Refill  . acetaminophen (TYLENOL) 325 MG tablet Take 650 mg by mouth daily as needed for pain. For pain      . Biotin 5000 MCG CAPS Take 5,000 mcg by mouth at bedtime.       Marland Kitchen doxazosin (CARDURA) 8 MG tablet Take 8 mg by mouth every morning.      . escitalopram (LEXAPRO) 20 MG tablet Take 20 mg by mouth every morning.      . famotidine (PEPCID) 20 MG tablet Take 20 mg by mouth at bedtime.       . furosemide (LASIX) 40 MG tablet Take 40 mg by mouth every morning.       Marland Kitchen HYDROmorphone (DILAUDID) 2 MG tablet Take 0.5-1 tablets (1-2 mg total) by mouth every 4 (four) hours as needed.  50 tablet  0  . Insulin Glargine (LANTUS SOLOSTAR) 100 UNIT/ML SOPN Inject 70 Units into the skin every morning.      Marland Kitchen levothyroxine (SYNTHROID, LEVOTHROID) 88 MCG tablet Take 88 mcg by mouth daily before breakfast. Name brand only       . lidocaine-prilocaine (EMLA) cream Apply 1 application topically as needed. Apply 1 teaspoon to PAC site 1-2 hours prior to stick and cover with plastic wrap to numb site. Do not rub cream in.  30 g  prn  . losartan (COZAAR) 25 MG tablet Take 25 mg by mouth every morning.      . ondansetron (ZOFRAN) 4 MG tablet TAKE 1 TABLET BY MOUTH EVERY 8 HOURS AS NEEDED FOR NAUSEA  20 tablet  2  . potassium chloride (KLOR-CON) 20 MEQ packet Take 20 mEq by mouth every morning.       . pregabalin (LYRICA) 75 MG capsule Take 75-150 mg by mouth 2 (two) times daily. 75mg  in tje morning and 150mg  at bedtime      . prochlorperazine (COMPAZINE) 10 MG tablet Take 1 tablet (10 mg total) by mouth every 6 (six) hours as needed (nausea).  60 tablet  1  . simvastatin (ZOCOR) 20 MG tablet Take 20 mg by mouth at bedtime.      . temazepam (RESTORIL) 30 MG capsule Take 1 capsule (30 mg total) by mouth at bedtime as needed for sleep.  30 capsule  0  . traMADol (ULTRAM) 50 MG tablet Take 50 mg  by mouth every 6 (six) hours as needed for pain.      Marland Kitchen warfarin (COUMADIN) 10 MG tablet Take 10 mg by mouth daily. Tuesday, Thursday, Saturday 15mg . Sunday, Monday, wed., Friday 10mg .      . capecitabine (XELODA) 500 MG tablet Take 3 tablets (1,500 mg total) by mouth 2 (two) times daily after a meal. Take on Days of radiation only (Mon-Fri)  168 tablet  0  . [DISCONTINUED] fesoterodine (TOVIAZ) 4 MG TB24 Take 1 tablet (4 mg total) by mouth at bedtime.  30 tablet  3   No current facility-administered medications for this encounter.    Physical Findings: The patient is in no acute distress. Patient is alert and oriented.  height is 5\' 6"  (1.676 m) and weight is 222 lb 4.8 oz (100.835 kg). Her oral temperature is 98.7 F (37.1 C). Her blood pressure is 143/60 and her pulse is 85. Her respiration is 16. .   General: Well-developed, in no acute distress HEENT: Normocephalic, atraumatic Cardiovascular: Regular rate and  rhythm Respiratory: Clear to auscultation bilaterally GI: Soft, nontender, normal bowel sounds Extremities: No edema present   Lab Findings: Lab Results  Component Value Date   WBC 3.4* 02/12/2013   HGB 10.4* 02/12/2013   HCT 32.3* 02/12/2013   MCV 85.5 02/12/2013   PLT 146 02/12/2013     Radiographic Findings: Ct Chest W Contrast  02/10/2013   CLINICAL DATA:  Pancreatic cancer restaging.  EXAM: CT CHEST, ABDOMEN, AND PELVIS WITH CONTRAST  TECHNIQUE: Multidetector CT imaging of the chest, abdomen and pelvis was performed following the standard protocol during bolus administration of intravenous contrast.  CONTRAST:  141mL OMNIPAQUE IOHEXOL 300 MG/ML  SOLN  COMPARISON:  CT ANGIO ABDOMEN W/CM &/OR WO/CM dated 10/29/2012; SP TRANSHEPAT PORTOGRAPHY W/O HEMO dated 11/07/2012; CT ANGIO ABDOMEN W/CM &/OR WO/CM dated 10/03/2012; CT ABD/PELVIS W CM dated 10/01/2012  FINDINGS: CT CHEST FINDINGS  No axillary or supraclavicular lymphadenopathy. No mediastinal hilar lymphadenopathy. No pericardial fluid. Esophagus is normal.  Review of the lung parenchyma demonstrates no suspicious pulmonary nodules. Airways are normal.  CT ABDOMEN AND PELVIS FINDINGS  Pancreas: Again demonstrated a round hypodense lesion in the head of pancreas measuring 15 mm x 15 mm (image 59, series 2) compared to 9 mm x 11 mm on most recent comparison of 10/29/2012. Smaller cystic lesion in the body of the pancreas measures 10 mm similar to 9 mm on prior. There is again demonstrated thrombus and expansion of the main portal vein and SMV not changed from prior. There is a splenoportal shunt again demonstrated. No evidence of peripancreatic adenopathy.  No evidence of enhancing lesion within the liver. Multiple gallstones within the gallbladder. The spleen, adrenal glands, and left kidney are normal. There is nonenhancing cysts within the right kidney. There is a extrarenal pelvis on the right.  The stomach, small bowel, cecum are normal. The  colon and appendix are normal.  Abdominal aorta is normal caliber. No retroperitoneal or retrocrural adenopathy. No pelvic lymphadenopathy. The uterus and bladder normal. Small amount free fluid the pelvis. No aggressive osseous lesion.  IMPRESSION: 1. Increased in size of 1.5 cm round hypodense lesion in the head of the pancreas 2. No evidence of thoracic metastasis. 3. No evidence of a peripancreatic nodal metastasis or liver metastasis. 4. No evidence of peritoneal metastasis. 5. 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   Electronically Signed   By: Suzy Bouchard M.D.   On:  02/10/2013 08:35   Ct Abdomen Pelvis W Contrast  02/10/2013   CLINICAL DATA:  Pancreatic cancer restaging.  EXAM: CT CHEST, ABDOMEN, AND PELVIS WITH CONTRAST  TECHNIQUE: Multidetector CT imaging of the chest, abdomen and pelvis was performed following the standard protocol during bolus administration of intravenous contrast.  CONTRAST:  159mL OMNIPAQUE IOHEXOL 300 MG/ML  SOLN  COMPARISON:  CT ANGIO ABDOMEN W/CM &/OR WO/CM dated 10/29/2012; SP TRANSHEPAT PORTOGRAPHY W/O HEMO dated 11/07/2012; CT ANGIO ABDOMEN W/CM &/OR WO/CM dated 10/03/2012; CT ABD/PELVIS W CM dated 10/01/2012  FINDINGS: CT CHEST FINDINGS  No axillary or supraclavicular lymphadenopathy. No mediastinal hilar lymphadenopathy. No pericardial fluid. Esophagus is normal.  Review of the lung parenchyma demonstrates no suspicious pulmonary nodules. Airways are normal.  CT ABDOMEN AND PELVIS FINDINGS  Pancreas: Again demonstrated a round hypodense lesion in the head of pancreas measuring 15 mm x 15 mm (image 59, series 2) compared to 9 mm x 11 mm on most recent comparison of 10/29/2012. Smaller cystic lesion in the body of the pancreas measures 10 mm similar to 9 mm on prior. There is again demonstrated thrombus and expansion of the main portal vein and SMV not changed from prior. There is a splenoportal shunt again demonstrated. No evidence of  peripancreatic adenopathy.  No evidence of enhancing lesion within the liver. Multiple gallstones within the gallbladder. The spleen, adrenal glands, and left kidney are normal. There is nonenhancing cysts within the right kidney. There is a extrarenal pelvis on the right.  The stomach, small bowel, cecum are normal. The colon and appendix are normal.  Abdominal aorta is normal caliber. No retroperitoneal or retrocrural adenopathy. No pelvic lymphadenopathy. The uterus and bladder normal. Small amount free fluid the pelvis. No aggressive osseous lesion.  IMPRESSION: 1. Increased in size of 1.5 cm round hypodense lesion in the head of the pancreas 2. No evidence of thoracic metastasis. 3. No evidence of a peripancreatic nodal metastasis or liver metastasis. 4. No evidence of peritoneal metastasis. 5. 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   Electronically Signed   By: Suzy Bouchard M.D.   On: 02/10/2013 08:35    Impression:    The patient did satisfactorily with chemotherapy but unfortunately her tumor did increase somewhat in size on recent CT scan. I was able to personally review this imaging study. I discussed possible treatment options with the patient. We discussed the fact that standard treatment would typically consist of chemoradiotherapy in the absence of metastatic disease at this time. We also discussed the possible treatment modality of stereotactic body radiotherapy to the pancreatic mass. We discussed the pros and times of each approach as well as the fact that there is not as much data regarding stereotactic body radiotherapy in the setting of pancreatic cancer although there are some encouraging results, especially in terms of local control. We had a detailed discussion of these issues, including the risks and potential side effects. All of her questions were answered.  After this discussion was the patient that she would like to proceed with  stereotactic body radiotherapy.  Plan:  I will discuss this decision with Dr. Benay Spice. I anticipate proceeding with a 5 fraction course of stereotactic body radiation treatment in the near future. She will be scheduled for a simulation.  I spent 30 minutes with the patient today, the majority of which was spent counseling the patient on the diagnosis of cancer and coordinating care.   Jodelle Gross, M.D.,  Ph.D.

## 2013-02-24 ENCOUNTER — Encounter: Payer: Self-pay | Admitting: Pharmacist

## 2013-02-24 NOTE — Progress Notes (Signed)
Ran into patient in the hallway. She stated that she forgot to take her coumadin last night. She was wondering if she should take it now and then again tonight (two doses today). We discussed that she should just take tonight's dose. Do not double dose today. Also, she would like to change her lab/CC to 02/27/13 to combine with already scheduled appointments with RadOnc. Message sent to schedulers to cancel lab/CC on 02/26/13 and add lab/CC to 02/27/13.

## 2013-02-26 ENCOUNTER — Telehealth: Payer: Self-pay | Admitting: *Deleted

## 2013-02-26 ENCOUNTER — Ambulatory Visit: Payer: 59

## 2013-02-26 ENCOUNTER — Other Ambulatory Visit: Payer: Self-pay | Admitting: *Deleted

## 2013-02-26 ENCOUNTER — Other Ambulatory Visit: Payer: 59

## 2013-02-26 MED ORDER — TEMAZEPAM 30 MG PO CAPS: 30.0000 mg | ORAL_CAPSULE | Freq: Every evening | ORAL | Status: DC | PRN

## 2013-02-26 NOTE — Telephone Encounter (Signed)
Message from [place] reporting pt has not picked up Xeloda. Upon review of chart, pt will receive stereotactic radiation without chemo. Called Theresa Summers to cancel Xeloda Rx.

## 2013-02-27 ENCOUNTER — Ambulatory Visit
Admission: RE | Admit: 2013-02-27 | Discharge: 2013-02-27 | Disposition: A | Discharge status: Home / Self Care | Payer: 59 | Source: Ambulatory Visit | Attending: Radiation Oncology | Admitting: Radiation Oncology

## 2013-02-27 ENCOUNTER — Ambulatory Visit (HOSPITAL_BASED_OUTPATIENT_CLINIC_OR_DEPARTMENT_OTHER): Payer: Self-pay | Admitting: Pharmacist

## 2013-02-27 ENCOUNTER — Telehealth: Payer: Self-pay | Admitting: *Deleted

## 2013-02-27 ENCOUNTER — Other Ambulatory Visit (HOSPITAL_BASED_OUTPATIENT_CLINIC_OR_DEPARTMENT_OTHER): Payer: 59

## 2013-02-27 VITALS — BP 169/76 | HR 89 | Temp 98.2°F | Ht 66.0 in | Wt 221.3 lb

## 2013-02-27 DIAGNOSIS — C250 Malignant neoplasm of head of pancreas: Secondary | ICD-10-CM

## 2013-02-27 DIAGNOSIS — I81 Portal vein thrombosis: Secondary | ICD-10-CM

## 2013-02-27 DIAGNOSIS — C259 Malignant neoplasm of pancreas, unspecified: Secondary | ICD-10-CM | POA: Diagnosis not present

## 2013-02-27 DIAGNOSIS — Z7982 Long term (current) use of aspirin: Secondary | ICD-10-CM | POA: Insufficient documentation

## 2013-02-27 DIAGNOSIS — Z510 Encounter for antineoplastic radiation therapy: Secondary | ICD-10-CM | POA: Insufficient documentation

## 2013-02-27 DIAGNOSIS — Z79899 Other long term (current) drug therapy: Secondary | ICD-10-CM | POA: Diagnosis not present

## 2013-02-27 DIAGNOSIS — Z7901 Long term (current) use of anticoagulants: Secondary | ICD-10-CM | POA: Insufficient documentation

## 2013-02-27 DIAGNOSIS — N1330 Unspecified hydronephrosis: Secondary | ICD-10-CM

## 2013-02-27 LAB — PROTIME-INR
INR: 1.7 — ABNORMAL LOW (ref 2.00–3.50)
Protime: 20.4 s — ABNORMAL HIGH (ref 10.6–13.4)

## 2013-02-27 LAB — POCT INR: INR: 1.7

## 2013-02-27 MED ORDER — SODIUM CHLORIDE 0.9 % IJ SOLN
10.0000 mL | Freq: Once | INTRAMUSCULAR | Status: AC
  Administered 2013-02-27: 10 mL via INTRAVENOUS

## 2013-02-27 MED ORDER — HEPARIN SOD (PORK) LOCK FLUSH 100 UNIT/ML IV SOLN
500.0000 [IU] | Freq: Once | INTRAVENOUS | Status: AC
  Administered 2013-02-27: 500 [IU] via INTRAVENOUS

## 2013-02-27 NOTE — Progress Notes (Signed)
Flushed left subclavian port a cath per protocol with saline and heparin. Removed access. Needle intact upon removal. Patient tolerated well. Applied an bandaid to old access site. Patient tolerated well. Patient discharged home.

## 2013-02-27 NOTE — Progress Notes (Signed)
Patient arrived for IV start for CT simulation, ,  1Labs 02/12/13 BUN=7.2, CR=0.7, not allerigic to IV dye, patient has power port left subclavian, per protocol using sterile technique, accessed left power port with power port  a 20g 1In huber needle patint tolerated well, excellent blood return,fflushed with 56ml normal saline, ,called [place],RT patient is ready 9:28 AM

## 2013-02-27 NOTE — Progress Notes (Signed)
INR = 1.7 on Coumadin 7.5 mg/day except 10 mg Wed/Sat. Pt missed her Coumadin on 02/23/13. No other complaints. She will not be on Xeloda as planned. INR below goal.  Likely due to some degree by missing her dose a few days ago.  I've advised her to take 10 mg today (usual 7.5 mg day) & next week increase her dose to 7.5 mg/day except 10 mg Tu/Th/Sat. Repeat protime in 2 weeks. NO CHARGE- phone encounter.  Pt left after Rad/Onc appt & did not come to Coumadin clinic.  She called after leaving to discuss results & dosing. Kennith Center, Pharm.D., CPP 02/27/2013@12 :38 PM

## 2013-02-27 NOTE — Telephone Encounter (Signed)
error 

## 2013-03-03 NOTE — Progress Notes (Signed)
FAXED [place], [place] FAX AND HER PHONE NUMBER IS [phone] [phone].

## 2013-03-06 ENCOUNTER — Ambulatory Visit (INDEPENDENT_AMBULATORY_CARE_PROVIDER_SITE_OTHER): Payer: 59

## 2013-03-06 DIAGNOSIS — Z510 Encounter for antineoplastic radiation therapy: Secondary | ICD-10-CM | POA: Diagnosis not present

## 2013-03-06 DIAGNOSIS — E538 Deficiency of other specified B group vitamins: Secondary | ICD-10-CM

## 2013-03-06 MED ORDER — CYANOCOBALAMIN 1000 MCG/ML IJ SOLN
1000.0000 ug | Freq: Once | INTRAMUSCULAR | Status: AC
Start: 2013-03-06 — End: 2013-03-06
  Administered 2013-03-06: 1000 ug via INTRAMUSCULAR

## 2013-03-07 ENCOUNTER — Ambulatory Visit
Admission: RE | Admit: 2013-03-07 | Discharge: 2013-03-07 | Disposition: A | Discharge status: Home / Self Care | Payer: 59 | Source: Ambulatory Visit

## 2013-03-07 DIAGNOSIS — Z1231 Encounter for screening mammogram for malignant neoplasm of breast: Secondary | ICD-10-CM

## 2013-03-07 DIAGNOSIS — Z510 Encounter for antineoplastic radiation therapy: Secondary | ICD-10-CM | POA: Diagnosis not present

## 2013-03-10 ENCOUNTER — Ambulatory Visit
Admission: RE | Admit: 2013-03-10 | Discharge: 2013-03-10 | Disposition: A | Discharge status: Home / Self Care | Payer: 59 | Source: Ambulatory Visit | Attending: Radiation Oncology | Admitting: Radiation Oncology

## 2013-03-10 ENCOUNTER — Ambulatory Visit: Payer: 59 | Admitting: Pharmacist

## 2013-03-10 ENCOUNTER — Other Ambulatory Visit (HOSPITAL_BASED_OUTPATIENT_CLINIC_OR_DEPARTMENT_OTHER): Payer: 59

## 2013-03-10 DIAGNOSIS — Z510 Encounter for antineoplastic radiation therapy: Secondary | ICD-10-CM | POA: Diagnosis not present

## 2013-03-10 DIAGNOSIS — N1330 Unspecified hydronephrosis: Secondary | ICD-10-CM

## 2013-03-10 DIAGNOSIS — I81 Portal vein thrombosis: Secondary | ICD-10-CM

## 2013-03-10 DIAGNOSIS — C250 Malignant neoplasm of head of pancreas: Secondary | ICD-10-CM

## 2013-03-10 DIAGNOSIS — C259 Malignant neoplasm of pancreas, unspecified: Secondary | ICD-10-CM

## 2013-03-10 LAB — CBC WITH DIFFERENTIAL/PLATELET
BASO%: 0.5 % (ref 0.0–2.0)
Basophils Absolute: 0 10*3/uL (ref 0.0–0.1)
EOS%: 4.6 % (ref 0.0–7.0)
Eosinophils Absolute: 0.3 10*3/uL (ref 0.0–0.5)
HCT: 35.5 % (ref 34.8–46.6)
HGB: 11.4 g/dL — ABNORMAL LOW (ref 11.6–15.9)
LYMPH%: 17.2 % (ref 14.0–49.7)
MCH: 27.1 pg (ref 25.1–34.0)
MCHC: 32.1 g/dL (ref 31.5–36.0)
MCV: 84.5 fL (ref 79.5–101.0)
MONO#: 0.5 10*3/uL (ref 0.1–0.9)
MONO%: 7.3 % (ref 0.0–14.0)
NEUT#: 4.4 10*3/uL (ref 1.5–6.5)
NEUT%: 70.4 % (ref 38.4–76.8)
Platelets: 146 10*3/uL (ref 145–400)
RBC: 4.2 10*6/uL (ref 3.70–5.45)
RDW: 19.3 % — AB (ref 11.2–14.5)
WBC: 6.3 10*3/uL (ref 3.9–10.3)
lymph#: 1.1 10*3/uL (ref 0.9–3.3)

## 2013-03-10 LAB — PROTIME-INR
INR: 2.5 (ref 2.00–3.50)
PROTIME: 30 s — AB (ref 10.6–13.4)

## 2013-03-10 LAB — POCT INR: INR: 2.5

## 2013-03-10 NOTE — Progress Notes (Signed)
INR within goal today. Pt doing well. No concerns regarding anticoagulation to report. No missed doses. No changes in diet or medications. Will continue same dose and recheck INR in 10days with Radiation. Continue 7.5mg  daily except 10mg  on TuTHuSat. Recheck INR in 10 days on 03/20/13; lab at 10 am, coumadin clinic at 10:15am and radiation at 10:55am.

## 2013-03-10 NOTE — Progress Notes (Signed)
  Radiation Oncology         ([phone]) [phone] ________________________________  Name: IRANIA DURELL MRN: [id number]  Date: 03/10/2013  DOB: Jan 05, 1941  Stereotactic Body Radiotherapy Treatment Procedure Note  NARRATIVE:  KYNSLEI ART was brought to the stereotactic radiation treatment machine and placed supine on the CT couch. The patient was set up for stereotactic body radiotherapy on the body fix pillow.  3D TREATMENT PLANNING AND DOSIMETRY:  The patient's radiation plan was reviewed and approved prior to starting treatment.  It showed 3-dimensional radiation distributions overlaid onto the planning CT.  [facility] for the target structures as well as the organs at risk were reviewed. The documentation of this is filed in the radiation oncology EMR.  SIMULATION VERIFICATION:  The patient underwent CT imaging on the treatment unit.  These were carefully aligned to document that the ablative radiation dose would cover the target volume and maximally spare the nearby organs at risk according to the planned distribution.  SPECIAL TREATMENT PROCEDURE: [name] received high dose ablative stereotactic body radiotherapy to the planned target volume without unforeseen complications. Treatment was delivered uneventfully. The high doses associated with stereotactic body radiotherapy and the significant potential risks require careful treatment set up and patient monitoring constituting a special treatment procedure   STEREOTACTIC TREATMENT MANAGEMENT:  Following delivery, the patient was evaluated clinically. The patient tolerated treatment without significant acute effects, and was discharged to home in stable condition.    PLAN: The first fraction completed. Continue treatment as planned.  ________________________________  Jodelle Gross, MD, PhD

## 2013-03-10 NOTE — Patient Instructions (Signed)
Continue 7.5mg  daily except 10mg  on TuTHuSat. Recheck INR in 10 days on 03/20/13; lab at 10 am, coumadin clinic at 10:15am and radiation at 10:55am.

## 2013-03-11 ENCOUNTER — Telehealth: Payer: Self-pay | Admitting: Oncology

## 2013-03-11 ENCOUNTER — Telehealth: Payer: Self-pay

## 2013-03-11 ENCOUNTER — Other Ambulatory Visit: Payer: Self-pay | Admitting: *Deleted

## 2013-03-11 NOTE — Telephone Encounter (Signed)
lmonvm for pt re appt for 2/10. also confirmed 1/29 appt and mailed schedule.

## 2013-03-11 NOTE — Telephone Encounter (Signed)
Pt request confirmation that verification of flu shot had been faxed. Theresa Summers did receive confirmation fax of flu vaccine date did go thru. Pt voiced understanding.

## 2013-03-12 ENCOUNTER — Ambulatory Visit
Admission: RE | Admit: 2013-03-12 | Discharge: 2013-03-12 | Disposition: A | Discharge status: Home / Self Care | Payer: 59 | Source: Ambulatory Visit | Attending: Radiation Oncology | Admitting: Radiation Oncology

## 2013-03-12 DIAGNOSIS — C259 Malignant neoplasm of pancreas, unspecified: Secondary | ICD-10-CM

## 2013-03-12 DIAGNOSIS — Z510 Encounter for antineoplastic radiation therapy: Secondary | ICD-10-CM | POA: Diagnosis not present

## 2013-03-12 NOTE — Progress Notes (Signed)
  Radiation Oncology         [phone] ________________________________  Name: Theresa SIHARATH MRN: [id number]  Date: 03/12/2013  DOB: 1940/12/19  Stereotactic Body Radiotherapy Treatment Procedure Note  NARRATIVE:  Theresa Summers was brought to the stereotactic radiation treatment machine and placed supine on the CT couch. The patient was set up for stereotactic body radiotherapy on the body fix pillow.  3D TREATMENT PLANNING AND DOSIMETRY:  The patient's radiation plan was reviewed and approved prior to starting treatment.  It showed 3-dimensional radiation distributions overlaid onto the planning CT.  The [facility] for the target structures as well as the organs at risk were reviewed. The documentation of this is filed in the radiation oncology EMR.  SIMULATION VERIFICATION:  The patient underwent CT imaging on the treatment unit.  These were carefully aligned to document that the ablative radiation dose would cover the target volume and maximally spare the nearby organs at risk according to the planned distribution.  SPECIAL TREATMENT PROCEDURE: [name] received high dose ablative stereotactic body radiotherapy to the planned target volume without unforeseen complications. Treatment was delivered uneventfully. The high doses associated with stereotactic body radiotherapy and the significant potential risks require careful treatment set up and patient monitoring constituting a special treatment procedure   STEREOTACTIC TREATMENT MANAGEMENT:  Following delivery, the patient was evaluated clinically. The patient tolerated treatment without significant acute effects, and was discharged to home in stable condition.    PLAN: Continue treatment as planned.  ________________________________  Sheral Apley. Tammi Klippel, M.D.

## 2013-03-14 ENCOUNTER — Encounter: Payer: Self-pay | Admitting: Radiation Oncology

## 2013-03-14 ENCOUNTER — Ambulatory Visit
Admission: RE | Admit: 2013-03-14 | Discharge: 2013-03-14 | Disposition: A | Discharge status: Home / Self Care | Payer: 59 | Source: Ambulatory Visit | Attending: Radiation Oncology | Admitting: Radiation Oncology

## 2013-03-14 VITALS — BP 114/60 | HR 79 | Temp 97.7°F | Resp 20 | Wt 216.2 lb

## 2013-03-14 DIAGNOSIS — C250 Malignant neoplasm of head of pancreas: Secondary | ICD-10-CM

## 2013-03-14 DIAGNOSIS — Z510 Encounter for antineoplastic radiation therapy: Secondary | ICD-10-CM | POA: Diagnosis not present

## 2013-03-14 NOTE — Progress Notes (Signed)
Department of Radiation Oncology  Phone:  [phone] Fax:        [phone]  Weekly Treatment Note    Name: Theresa Summers Date: 03/14/2013 MRN: [id number] DOB: 02/04/1941   Current dose: 19.5 Gy  Current fraction: 3   MEDICATIONS: Current Outpatient Prescriptions  Medication Sig Dispense Refill  . acetaminophen (TYLENOL) 325 MG tablet Take 650 mg by mouth daily as needed for pain. For pain      . Biotin 5000 MCG CAPS Take 5,000 mcg by mouth at bedtime.       Marland Kitchen doxazosin (CARDURA) 8 MG tablet Take 8 mg by mouth every morning.      . escitalopram (LEXAPRO) 20 MG tablet Take 20 mg by mouth every morning.      . famotidine (PEPCID) 20 MG tablet Take 20 mg by mouth at bedtime.       . furosemide (LASIX) 40 MG tablet Take 40 mg by mouth every morning.       Marland Kitchen HYDROmorphone (DILAUDID) 2 MG tablet Take 0.5-1 tablets (1-2 mg total) by mouth every 4 (four) hours as needed.  50 tablet  0  . Insulin Glargine (LANTUS SOLOSTAR) 100 UNIT/ML SOPN Inject 70 Units into the skin every morning.      Marland Kitchen levothyroxine (SYNTHROID, LEVOTHROID) 88 MCG tablet Take 88 mcg by mouth daily before breakfast. Name brand only      . lidocaine-prilocaine (EMLA) cream Apply 1 application topically as needed. Apply 1 teaspoon to PAC site 1-2 hours prior to stick and cover with plastic wrap to numb site. Do not rub cream in.  30 g  prn  . losartan (COZAAR) 25 MG tablet Take 25 mg by mouth every morning.      . ondansetron (ZOFRAN) 4 MG tablet TAKE 1 TABLET BY MOUTH EVERY 8 HOURS AS NEEDED FOR NAUSEA  20 tablet  2  . potassium chloride (KLOR-CON) 20 MEQ packet Take 20 mEq by mouth every morning.       . pregabalin (LYRICA) 75 MG capsule Take 75-150 mg by mouth 2 (two) times daily. 75mg  in tje morning and 150mg  at bedtime      . prochlorperazine (COMPAZINE) 10 MG tablet Take 1 tablet (10 mg total) by mouth every 6 (six) hours as needed (nausea).  60 tablet  1  . simvastatin (ZOCOR) 20 MG tablet Take 20 mg by mouth  at bedtime.      . temazepam (RESTORIL) 30 MG capsule Take 1 capsule (30 mg total) by mouth at bedtime as needed for sleep.  30 capsule  0  . traMADol (ULTRAM) 50 MG tablet Take 50 mg by mouth every 6 (six) hours as needed for pain.      Marland Kitchen warfarin (COUMADIN) 10 MG tablet Take 10 mg by mouth daily. Tuesday, Thursday, Saturday 15mg . Sunday, Monday, wed., Friday 10mg .      . [DISCONTINUED] fesoterodine (TOVIAZ) 4 MG TB24 Take 1 tablet (4 mg total) by mouth at bedtime.  30 tablet  3   No current facility-administered medications for this encounter.     ALLERGIES: Oxycodone hcl; Latex; and Zithromax   LABORATORY DATA:  Lab Results  Component Value Date   WBC 6.3 03/10/2013   HGB 11.4* 03/10/2013   HCT 35.5 03/10/2013   MCV 84.5 03/10/2013   PLT 146 03/10/2013   Lab Results  Component Value Date   NA 143 02/12/2013   K 4.3 02/12/2013   CL 104 01/15/2013   CO2 25 02/12/2013  Lab Results  Component Value Date   ALT 19 02/12/2013   AST 23 02/12/2013   ALKPHOS 57 02/12/2013   BILITOT 0.49 02/12/2013     NARRATIVE: Theresa Summers was seen today for weekly treatment management. The chart was checked and the patient's films were reviewed. The patient states she is doing very well. She had one episode of nausea earlier this week which responded well with Zofran. No diarrhea. No other complaints.  PHYSICAL EXAMINATION: weight is 216 lb 3.2 oz (98.068 kg). Her oral temperature is 97.7 F (36.5 C). Her blood pressure is 114/60 and her pulse is 79. Her respiration is 20.        ASSESSMENT: The patient is doing satisfactorily with treatment.  PLAN: We will continue with the patient's radiation treatment as planned.

## 2013-03-14 NOTE — Progress Notes (Signed)
Rad tx sbrt pancreas, slight nausea,  Taking zofran, and so46me abdominal discomfort, no medication needed  Per pateint, appetite good, drinking water, some fatigue 11:19 AM

## 2013-03-17 NOTE — Progress Notes (Addendum)
  Radiation Oncology         [phone] ________________________________  Name: Theresa Summers MRN: [id number]  Date: 03/14/2013  DOB: Jun 22, 1940  Stereotactic Body Radiotherapy Treatment Procedure Note  NARRATIVE:  Theresa Summers was brought to the stereotactic radiation treatment machine and placed supine on the CT couch. The patient was set up for stereotactic body radiotherapy on the body fix pillow.   3D TREATMENT PLANNING AND DOSIMETRY:  The patient's radiation plan was reviewed and approved prior to starting treatment.  It showed 3-dimensional radiation distributions overlaid onto the planning CT.  The [facility] for the target structures as well as the organs at risk were reviewed. The documentation of this is filed in the radiation oncology EMR.  SIMULATION VERIFICATION:  The patient underwent CT imaging on the treatment unit.  These were carefully aligned to document that the ablative radiation dose would cover the target volume and maximally spare the nearby organs at risk according to the planned distribution.  SPECIAL TREATMENT PROCEDURE: Theresa Summers received high dose ablative stereotactic body radiotherapy to the planned target volume without unforeseen complications. Treatment was delivered uneventfully. The high doses associated with stereotactic body radiotherapy and the significant potential risks require careful treatment set up and patient monitoring constituting a special treatment procedure   STEREOTACTIC TREATMENT MANAGEMENT:  Following delivery, the patient was evaluated clinically. The patient tolerated treatment without significant acute effects, and was discharged to home in stable condition.    Fraction: 3  Dose:  19.5 Gy  PLAN: Continue treatment as planned.  ________________________________  Jodelle Gross, MD, PhD

## 2013-03-18 ENCOUNTER — Ambulatory Visit
Admission: RE | Admit: 2013-03-18 | Discharge: 2013-03-18 | Disposition: A | Discharge status: Home / Self Care | Payer: 59 | Source: Ambulatory Visit | Attending: Radiation Oncology | Admitting: Radiation Oncology

## 2013-03-18 DIAGNOSIS — C250 Malignant neoplasm of head of pancreas: Secondary | ICD-10-CM

## 2013-03-18 DIAGNOSIS — Z510 Encounter for antineoplastic radiation therapy: Secondary | ICD-10-CM | POA: Diagnosis not present

## 2013-03-18 NOTE — Progress Notes (Signed)
  Radiation Oncology         [phone] ________________________________  Name: Theresa Summers MRN: [id number]  Date: 03/18/2013  DOB: 30-Jan-1941  Stereotactic Body Radiotherapy Treatment Procedure Note  NARRATIVE:  Theresa Summers was brought to the stereotactic radiation treatment machine and placed supine on the CT couch. The patient was set up for stereotactic body radiotherapy on the body fix pillow.  3D TREATMENT PLANNING AND DOSIMETRY:  The patient's radiation plan was reviewed and approved prior to starting treatment.  It showed 3-dimensional radiation distributions overlaid onto the planning CT.  The [facility] for the target structures as well as the organs at risk were reviewed. The documentation of this is filed in the radiation oncology EMR.  SIMULATION VERIFICATION:  The patient underwent CT imaging on the treatment unit.  These were carefully aligned to document that the ablative radiation dose would cover the target volume and maximally spare the nearby organs at risk according to the planned distribution.  SPECIAL TREATMENT PROCEDURE: Theresa Summers received high dose ablative stereotactic body radiotherapy to the planned target volume without unforeseen complications. Treatment was delivered uneventfully. The high doses associated with stereotactic body radiotherapy and the significant potential risks require careful treatment set up and patient monitoring constituting a special treatment procedure   STEREOTACTIC TREATMENT MANAGEMENT:  Following delivery, the patient was evaluated clinically. The patient tolerated treatment without significant acute effects, and was discharged to home in stable condition.    PLAN: Continue treatment as planned.  ________________________________  Blair Promise, PhD, MD

## 2013-03-19 ENCOUNTER — Ambulatory Visit (INDEPENDENT_AMBULATORY_CARE_PROVIDER_SITE_OTHER): Payer: Self-pay | Admitting: Family Medicine

## 2013-03-19 VITALS — Wt 220.0 lb

## 2013-03-19 DIAGNOSIS — E119 Type 2 diabetes mellitus without complications: Secondary | ICD-10-CM

## 2013-03-20 ENCOUNTER — Encounter: Payer: Self-pay | Admitting: Radiation Oncology

## 2013-03-20 ENCOUNTER — Other Ambulatory Visit (HOSPITAL_BASED_OUTPATIENT_CLINIC_OR_DEPARTMENT_OTHER): Payer: 59

## 2013-03-20 ENCOUNTER — Ambulatory Visit
Admission: RE | Admit: 2013-03-20 | Discharge: 2013-03-20 | Disposition: A | Discharge status: Home / Self Care | Payer: 59 | Source: Ambulatory Visit | Attending: Radiation Oncology | Admitting: Radiation Oncology

## 2013-03-20 ENCOUNTER — Ambulatory Visit: Payer: 59 | Admitting: Pharmacist

## 2013-03-20 DIAGNOSIS — C259 Malignant neoplasm of pancreas, unspecified: Secondary | ICD-10-CM

## 2013-03-20 DIAGNOSIS — I81 Portal vein thrombosis: Secondary | ICD-10-CM

## 2013-03-20 DIAGNOSIS — Z510 Encounter for antineoplastic radiation therapy: Secondary | ICD-10-CM | POA: Diagnosis not present

## 2013-03-20 DIAGNOSIS — C250 Malignant neoplasm of head of pancreas: Secondary | ICD-10-CM

## 2013-03-20 DIAGNOSIS — N1330 Unspecified hydronephrosis: Secondary | ICD-10-CM

## 2013-03-20 LAB — PROTIME-INR
INR: 2.2 (ref 2.00–3.50)
PROTIME: 26.4 s — AB (ref 10.6–13.4)

## 2013-03-20 LAB — POCT INR: INR: 2.2

## 2013-03-20 NOTE — Progress Notes (Signed)
  Radiation Oncology         [phone] ________________________________  Name: Theresa Summers MRN: [id number]  Date: 03/20/2013  DOB: February 13, 1941  Stereotactic Body Radiotherapy Treatment Procedure Note  NARRATIVE:  Theresa Summers was brought to the stereotactic radiation treatment machine and placed supine on the CT couch. The patient was set up for stereotactic body radiotherapy on the body fix pillow.  Fraction:  5/5  3D TREATMENT PLANNING AND DOSIMETRY:  The patient's radiation plan was reviewed and approved prior to starting treatment.  It showed 3-dimensional radiation distributions overlaid onto the planning CT.  The [facility] for the target structures as well as the organs at risk were reviewed. The documentation of this is filed in the radiation oncology EMR.  SIMULATION VERIFICATION:  The patient underwent CT imaging on the treatment unit.  These were carefully aligned to document that the ablative radiation dose would cover the target volume and maximally spare the nearby organs at risk according to the planned distribution.  SPECIAL TREATMENT PROCEDURE: Theresa Summers received high dose ablative stereotactic body radiotherapy to the planned target volume without unforeseen complications. Treatment was delivered uneventfully. The high doses associated with stereotactic body radiotherapy and the significant potential risks require careful treatment set up and patient monitoring constituting a special treatment procedure   STEREOTACTIC TREATMENT MANAGEMENT:  Following delivery, the patient was evaluated clinically. The patient tolerated treatment without significant acute effects, and was discharged to home in stable condition.    PLAN: Followup in one month  ________________________________  Jodelle Gross, MD, PhD

## 2013-03-20 NOTE — Progress Notes (Signed)
Pt seen in clinic today prior to RT INR=2.2 No changes to report She states she is done with RT after today. She inquires about having her INR checked at her PCP in [place] She plans to discuss this with Dr. Benay Spice at her appmt on 04/03/13. 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 Recheck INR in 2 weeks on 04/03/13; lab at 9 am, Dr. SheBenay Spice 9:30 and  coumadin clinic at 10:00am

## 2013-03-20 NOTE — Patient Instructions (Signed)
Continue 7.5mg  daily except 10mg  on TuTHuSat. Recheck INR in 2 weeks on 04/03/13; lab at 9 am, Dr. Benay Spice at 9:30 and  coumadin clinic at 10:00am

## 2013-03-25 NOTE — Progress Notes (Signed)
Patient presents today to re-establish diabetes follow-up as part of the employer-sponsored [employer] program. Patient has been undergoing treatment for pancreatic cancer and is recovering well. She will finish her last radiation treatment tomorrow and is very excited about this. Current diabetes regimen includes Lantus. Patient also continues on daily ARB and statin. Most recent MD follow-up was Jan 2015. Patient has a pending appt for 3 mo follow-up for DM.   Diabetes Assessment: Type of Diabetes: Type 2; Sees Diabetes provider 4 or more times per year; checks feet daily; uses glucometer; checks blood glucose 2-3 times a day; hypoglycemia infrequent; takes an aspirin a day; takes medications as prescribed; MD managing Diabetes Buddy Duty; Highest CBG 295; Lowest CBG 43; A1c 6.9 (Nov 2014) Other Diabetes History: Current med regimen includes Lantus 70 units daily. Both Metformin and victoza have been discontinued by provider. Patient does maintain good medication compliance. Patient did not bring meter today but is currently testing 1-2 times per day. Glucose monitoring occurs fasting, before bed, and when symptomatic. Hypoglycemia frequency is occassional. Patient reports a couple of episodes of dizziness associated with hypoglycemia readings of 54 and 43. She reports she were easy to correct with orange juice. Patient does demonstrate appropriate correction of hypoglycemia. Patient reports continued signs and symptoms of neuropathy including numbness/tingling/burning. She is currently being treated with Lyrica 75 mg 1 cap AM and 2 cap PM. Patient is not due for yearly eye exam.  Lifestyle Factors: Diet - Patient maintains a fairly healthy diet and has worked to continue this throughout her cancer therapy. She attempts to make healthy choices and strives to take care of herself. No dietary goals at this time other than to maintain healthy habits.  Exercise - Patient enjoys staying active and attempts to  exercise as much as she can tolerate. She is currently undergoing radiation treatment and has had 4 treatments thus far and will have her last treatment tomorrow (5 treatments over the past 2 weeks). She reports tolerating therapy well and has been very fortunate. Patient's most significant adverse effect has been severe fatigue and patient reports feeling a certain amount of guilt because she cannot maintain her normal activity level. For example, patient cannot bake a cake without resting multiple times which during the process. Of note patient also continues to struggle with neuropathic pain in lower legs, bursitis of hip, and arthritis of knee. She is motivated, but unable to exercise as she would like to at this time.   Assessment: Patient is doing well today with at-goal A1c of 6.9. Today she is re-establishing follow-up with [employer] after taking a break to accomadate pancreatic cancer treatments. She is recovering well and ready to get back on track with quarterly LTW follow-up. She has attempted to maintain a healthy diet and exercise and hopes to further improve these over the next few months as she regains energy after finishing radiation. She also continues struggling with a number of neuropathic and arthritis issues that inhibit exercise, but is hopeful to work through these. Pt will follow-up in 3 months.   Plan: 1) Continue to make healthy dietary choices 2) Continue to stay as active as tolerated, you are doing a great job with this!  3) Continue testing regularly 4) Return in 3 months on Wednesday April 29th @ 10:00 am

## 2013-03-27 NOTE — Progress Notes (Signed)
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  SITE:  Pancreas   CONSENT VERIFIED:yes   SET UP: Patient is setup supine   IMMOBILIZATION: The patient was immobilized using a Vac Loc bag, and a customized active form device was also constructed to aid in patient immobilization. The patient set up also involved abdominal compression to attempt to reduce respiratory motion. A total of 2 complex treatment devices therefore will be used for immobilization during the course of radiation.    NARRATIVE:The patient was brought to the [facility].  Identity was confirmed.  All relevant records and images related to the planned course of therapy were reviewed.  Then, the patient was positioned in a stable reproducible clinical set-up for radiation therapy.  2 customized immobilization devices were constructed: A customized VAC lock bag and a customized accuform device.    4D CT images were obtained and reproducible breathing pattern was confirmed. Free breathing CT images were obtained. The CT images were loaded into the planning software where the target and avoidance structures were contoured.  The radiation prescription was entered and confirmed.    TREATMENT PLANNING NOTE:  Treatment planning then occurred. I have requested : MLC's, 3D simulation/ isodose plan, basic dose calculation. It is anticipated that to customized fields will be used for the patient's treatment, with each of these corresponding to an additional complex treatment device.  3 dimensional simulation is performed and dose volume histogram of the gross tumor volume, planning tumor volume and criticial normal structures including the PTV, spinal cord, liver, kidney and bowel were analyzed and  requested.  Special treatment procedure was performed due to high dose per fraction and the complexity of the planning process.  The patient will be monitored for increased risk of toxicity.  Daily imaging using cone beam CT/ MV CT will be used for target localization.   PLAN:  The patient will receive 32.5 Gy in 5 fractions.    ________________________________   Jodelle Gross, MD, PhD

## 2013-03-27 NOTE — Addendum Note (Signed)
Encounter addended by: Marye Round, MD on: 03/27/2013  6:45 PM<BR>     Documentation filed: Charges VN, Notes Section, Visit Diagnoses

## 2013-03-27 NOTE — Progress Notes (Signed)
  Radiation Oncology         [phone] ________________________________  Name: TESSICA CUPO MRN: [id number]  Date: 03/20/2013  DOB: [birth year]  End of Treatment Note  Diagnosis:   Pancreatic cancer     Indication for treatment:  Curative       Radiation treatment dates:   03/10/2013 through 03/20/2013  Site/dose:   The patient was treated to the tumor within the pancreatic head. He received a course of stereotactic body radiation treatment to a dose of 32.5 gray in 5 fractions.  Narrative: The patient tolerated radiation treatment  well.   The patient had some minimal nausea during treatment.  Plan: The patient has completed radiation treatment. The patient will return to radiation oncology clinic for routine followup in one month. I advised the patient to call or return sooner if they have any questions or concerns related to their recovery or treatment. ________________________________  Jodelle Gross, M.D., Ph.D.

## 2013-03-31 ENCOUNTER — Other Ambulatory Visit: Payer: Self-pay | Admitting: Oncology

## 2013-03-31 ENCOUNTER — Other Ambulatory Visit: Payer: Self-pay | Admitting: Nurse Practitioner

## 2013-03-31 ENCOUNTER — Other Ambulatory Visit: Payer: Self-pay | Admitting: *Deleted

## 2013-03-31 DIAGNOSIS — C259 Malignant neoplasm of pancreas, unspecified: Secondary | ICD-10-CM

## 2013-03-31 MED ORDER — TRAMADOL HCL 50 MG PO TABS: 50.0000 mg | ORAL_TABLET | Freq: Four times a day (QID) | ORAL | Status: DC | PRN

## 2013-04-03 ENCOUNTER — Ambulatory Visit (HOSPITAL_BASED_OUTPATIENT_CLINIC_OR_DEPARTMENT_OTHER): Payer: 59 | Admitting: Oncology

## 2013-04-03 ENCOUNTER — Other Ambulatory Visit (HOSPITAL_BASED_OUTPATIENT_CLINIC_OR_DEPARTMENT_OTHER): Payer: 59

## 2013-04-03 ENCOUNTER — Ambulatory Visit (HOSPITAL_BASED_OUTPATIENT_CLINIC_OR_DEPARTMENT_OTHER): Payer: Self-pay | Admitting: Pharmacist

## 2013-04-03 ENCOUNTER — Telehealth: Payer: Self-pay | Admitting: Oncology

## 2013-04-03 VITALS — BP 111/44 | HR 98 | Temp 97.9°F | Resp 18 | Ht 66.0 in | Wt 221.3 lb

## 2013-04-03 DIAGNOSIS — I81 Portal vein thrombosis: Secondary | ICD-10-CM

## 2013-04-03 DIAGNOSIS — R970 Elevated carcinoembryonic antigen [CEA]: Secondary | ICD-10-CM

## 2013-04-03 DIAGNOSIS — E1149 Type 2 diabetes mellitus with other diabetic neurological complication: Secondary | ICD-10-CM

## 2013-04-03 DIAGNOSIS — D638 Anemia in other chronic diseases classified elsewhere: Secondary | ICD-10-CM

## 2013-04-03 DIAGNOSIS — T451X5A Adverse effect of antineoplastic and immunosuppressive drugs, initial encounter: Secondary | ICD-10-CM

## 2013-04-03 DIAGNOSIS — R109 Unspecified abdominal pain: Secondary | ICD-10-CM

## 2013-04-03 DIAGNOSIS — N1330 Unspecified hydronephrosis: Secondary | ICD-10-CM

## 2013-04-03 DIAGNOSIS — C259 Malignant neoplasm of pancreas, unspecified: Secondary | ICD-10-CM

## 2013-04-03 DIAGNOSIS — D6481 Anemia due to antineoplastic chemotherapy: Secondary | ICD-10-CM

## 2013-04-03 DIAGNOSIS — E1142 Type 2 diabetes mellitus with diabetic polyneuropathy: Secondary | ICD-10-CM

## 2013-04-03 DIAGNOSIS — C250 Malignant neoplasm of head of pancreas: Secondary | ICD-10-CM

## 2013-04-03 LAB — PROTIME-INR
INR: 2.9 (ref 2.00–3.50)
PROTIME: 34.8 s — AB (ref 10.6–13.4)

## 2013-04-03 LAB — POCT INR: INR: 2.9

## 2013-04-03 MED ORDER — SODIUM CHLORIDE 0.9 % IJ SOLN
10.0000 mL | INTRAMUSCULAR | Status: DC | PRN
  Administered 2013-04-03: 10 mL via INTRAVENOUS
  Filled 2013-04-03: qty 10

## 2013-04-03 MED ORDER — HEPARIN SOD (PORK) LOCK FLUSH 100 UNIT/ML IV SOLN
500.0000 [IU] | Freq: Once | INTRAVENOUS | Status: AC
  Administered 2013-04-03: 500 [IU] via INTRAVENOUS
  Filled 2013-04-03: qty 5

## 2013-04-03 NOTE — Progress Notes (Signed)
   [facility]    OFFICE PROGRESS NOTE   INTERVAL HISTORY:   Theresa Summers returns for scheduled followup pancreas cancer. She decided to proceed with SBRT treatment of the locally advanced pancreas tumor. She did not receive Xeloda. She completed radiation 03/20/2013.  She complains of malaise. She has intermittent low lymphoma pain, partially relieved with tramadol. She has not used Dilaudid. She is constipated. Mr. Nie is working 2 days per week. The abdominal pain is not constant.  Objective:  Vital signs in last 24 hours:  Blood pressure 111/44, pulse 98, temperature 97.9 F (36.6 C), temperature source Oral, resp. rate 18, height 5\' 6"  (1.676 m), weight 221 lb 4.8 oz (100.381 kg).    Resp: Lungs clear bilaterally Cardio: Regular rate and rhythm GI: No hepatomegaly, no apparent ascites, mild tenderness in the low abdomen bilaterally, no mass Vascular: Trace low leg edema bilaterally with chronic stasis change. No erythema.  Portacath/PICC-without erythema  Lab Results:  Lab Results  Component Value Date   WBC 6.3 03/10/2013   HGB 11.4* 03/10/2013   HCT 35.5 03/10/2013   MCV 84.5 03/10/2013   PLT 146 03/10/2013   NEUTROABS 4.4 03/10/2013      Medications: I have reviewed the patient's current medications.  Assessment/Plan: 1. Adenocarcinoma pancreas, pancreas head mass, endoscopic ultrasound on 10/17/2012 confirmed a pancreas head mass without vascular invasion or surrounding lymphadenopathy, biopsy positive for adenocarcinoma.  Elevated CA 19-9.  Initiation of gemcitabine/Abraxane on a three out of four week schedule on 11/21/2012.  CA 19-9 improved at 571 on 12/25/2012.  CA 19-9 improved at 257 on 01/22/2013  Restaging CT 02/10/2013 with an increase in the pancreas mass and no evidence of progressive metastatic disease Status post SBRT completed 03/20/2013 2. Portal vein thrombosis-initially maintained on Lovenox , extensive collateral vessel  formation confirmed on a venogram 11/07/2012. She is maintained on Coumadin.  3. Diabetes.  4. Right hydronephrosis-? Related to history of kidney stones versus carcinomatosis.  5. "Cirrhotic" changes and splenomegaly reported on the chest CT 10/25/2012.  6. History of kidney stones.  7. Hypothyroidism.  8. Normocytic anemia-secondary to chronic disease and chemotherapy , status post a red cell transfusion 01/15/2013.  9. 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  10. Pain secondary to pancreas cancer 11. Pain/tenderness, edema, erythema right lower leg,resolved 12/25/2012-negative venous duplex 12/25/2012, 01/08/2013 and 01/14/2013. No improvement with multiple courses of antibiotics. Question pseudo-cellulitis secondary to gemcitabine.  12. History ofPancytopenia secondary to chemotherapy.    Disposition:  Theresa Summers has completed SBRT to the pancreas. I suspect her malaise is related to the recent radiation. The abdominal pain is most likely secondary to pancreas cancer. I encouraged her to use Dilaudid if the tramadol does not help.  Theresa Summers continues Coumadin anticoagulation.  She will return for an office visit and CA 19-9 in 3 weeks. We will consider salvage chemotherapy options when there is clear evidence of disease progression.   Betsy Coder, MD  04/03/2013  11:08 AM

## 2013-04-03 NOTE — Patient Instructions (Signed)
For your constipation, increase your fluid intake and try taking MiraLax 17 grams in 8 ounces fluid daily. If still not effective, add Senna-S 1-2 tabs daily. Notify Dr. Benay Spice if this does not relieve your constipation.    Constipation, Adult Constipation is when a person has fewer than 3 bowel movements a week; has difficulty having a bowel movement; or has stools that are dry, hard, or larger than normal. As people grow older, constipation is more common.   CAUSES   Certain medicines, such as antidepressants, pain medicine, iron supplements, antacids, and water pills.   Certain diseases, such as diabetes, irritable bowel syndrome (IBS), thyroid disease, or depression.   Not drinking enough water.   Not eating enough fiber-rich foods.   Stress or travel.  Lack of physical activity or exercise.  Not going to the restroom when there is the urge to have a bowel movement.  Ignoring the urge to have a bowel movement.  Using laxatives too much. SYMPTOMS   Having fewer than 3 bowel movements a week.   Straining to have a bowel movement.   Having hard, dry, or larger than normal stools.   Feeling full or bloated.   Pain in the lower abdomen.  Not feeling relief after having a bowel movement. DIAGNOSIS  Your caregiver will take a medical history and perform a physical exam. Further testing may be done for severe constipation. Some tests may include:   A barium enema X-ray to examine your rectum, colon, and sometimes, your small intestine.  A sigmoidoscopy to examine your lower colon.  A colonoscopy to examine your entire colon. TREATMENT  Treatment will depend on the severity of your constipation and what is causing it. Some dietary treatments include drinking more fluids and eating more fiber-rich foods. Lifestyle treatments may include regular exercise. If these diet and lifestyle recommendations do not help, your caregiver may recommend taking over-the-counter  laxative medicines to help you have bowel movements. Prescription medicines may be prescribed if over-the-counter medicines do not work.  HOME CARE INSTRUCTIONS   Increase dietary fiber in your diet, such as fruits, vegetables, whole grains, and beans. Limit high-fat and processed sugars in your diet, such as [place] fries, hamburgers, cookies, candies, and soda.   A fiber supplement may be added to your diet if you cannot get enough fiber from foods.   Drink enough fluids to keep your urine clear or pale yellow.   Exercise regularly or as directed by your caregiver.   Go to the restroom when you have the urge to go. Do not hold it.  Only take medicines as directed by your caregiver. Do not take other medicines for constipation without talking to your caregiver first. [place] IF:   You have bright red blood in your stool.   Your constipation lasts for more than 4 days or gets worse.   You have abdominal or rectal pain.   You have thin, pencil-like stools.  You have unexplained weight loss. MAKE SURE YOU:   Understand these instructions.  Will watch your condition.  Will get help right away if you are not doing well or get worse. Document Released: 11/05/2003 Document Revised: 05/01/2011 Document Reviewed: 11/18/2012 [facility] Patient Information 2014 [place], [place].

## 2013-04-03 NOTE — Telephone Encounter (Signed)
gv adn printed appt sched and avs for pt for March.... °

## 2013-04-03 NOTE — Progress Notes (Signed)
Pt seen in clinic today after her appmt with Dr. Benay Spice INR=2.9 She continues having turnip greens every Monday Took two APAP this week No other medication or diet changes No coumadin refills needed  Continue 7.5mg  daily except 10mg  on TuTHuSat. Recheck INR in 3 weeks on 04/24/13; lab at 1:45 am, coumadin clinic 2:00, Ned Card at 2:15 and Dr. Lisbeth Renshaw at 3:00

## 2013-04-03 NOTE — Patient Instructions (Signed)
Continue 7.5mg  daily except 10mg  on TuTHuSat. Recheck INR in 3 weeks on 04/24/13; lab at 1:45 am, coumadin clinic 2:00, Ned Card at 2:15 and Dr. Lisbeth Renshaw at 3:00

## 2013-04-10 ENCOUNTER — Ambulatory Visit: Payer: 59

## 2013-04-10 ENCOUNTER — Ambulatory Visit (INDEPENDENT_AMBULATORY_CARE_PROVIDER_SITE_OTHER): Payer: 59 | Admitting: Family Medicine

## 2013-04-10 ENCOUNTER — Encounter: Payer: Self-pay | Admitting: Family Medicine

## 2013-04-10 VITALS — BP 112/58 | HR 76 | Temp 98.1°F | Ht 66.0 in | Wt 221.8 lb

## 2013-04-10 DIAGNOSIS — K5900 Constipation, unspecified: Secondary | ICD-10-CM

## 2013-04-10 DIAGNOSIS — C259 Malignant neoplasm of pancreas, unspecified: Secondary | ICD-10-CM

## 2013-04-10 DIAGNOSIS — R5383 Other fatigue: Secondary | ICD-10-CM

## 2013-04-10 DIAGNOSIS — D649 Anemia, unspecified: Secondary | ICD-10-CM

## 2013-04-10 DIAGNOSIS — R5381 Other malaise: Secondary | ICD-10-CM

## 2013-04-10 DIAGNOSIS — E538 Deficiency of other specified B group vitamins: Secondary | ICD-10-CM

## 2013-04-10 DIAGNOSIS — I10 Essential (primary) hypertension: Secondary | ICD-10-CM

## 2013-04-10 MED ORDER — CYANOCOBALAMIN 1000 MCG/ML IJ SOLN
1000.0000 ug | Freq: Once | INTRAMUSCULAR | Status: AC
  Administered 2013-04-10: 1000 ug via INTRAMUSCULAR

## 2013-04-10 NOTE — Progress Notes (Signed)
Subjective:    Patient ID: Theresa Summers, female    DOB: 06/16/1940, 73 y.o.   MRN: [id number]  HPI 73 yo with pancreatic CA here for follow up.  Hypertension- on Cozaar 25 mg daily along with Lasix and Cardura.   BPs have been low at cancer center and she has been dizzy when she stands up. BP Readings from Last 3 Encounters:  04/10/13 112/58  04/03/13 111/44  03/14/13 114/60   Last saw Dr. Benay Spice on 04/03/2013.  Note reviewed. Last radiation tx 03/20/2013. Restaging CT 02/10/2013 with an increase in the pancreas mass and no evidence of progressive metastatic disease  She has had fatigue and constipation.  Lab Results  Component Value Date   WBC 6.3 03/10/2013   HGB 11.4* 03/10/2013   HCT 35.5 03/10/2013   MCV 84.5 03/10/2013   PLT 146 03/10/2013      Taking Miralax for constipation but still having bouts of constipations since she started tx for pancreatic cancer.  Still working two days per week which she feels has really been helping her emotionally.  Patient Active Problem List   Diagnosis Date Noted  . Unspecified constipation 04/10/2013  . Cellulitis of right lower leg 01/15/2013  . Anemia of chronic disease 01/15/2013  . Neutropenia 01/15/2013  . Malignant neoplasm of head of pancreas 11/01/2012  . Pancreatic cancer 10/03/2012  . Portal vein thrombosis 10/02/2012  . Hydronephrosis 10/02/2012  . Unspecified hypothyroidism 05/28/2012  . GERD (gastroesophageal reflux disease)   . HYPERTENSION 01/14/2008  . DIABETIC PERIPHERAL NEUROPATHY 10/18/2007  . HYPERLIPIDEMIA 12/24/2006   Past Medical History  Diagnosis Date  . Trigger finger   . Hyperlipidemia   . Diabetes mellitus   . Chronic venous insufficiency   . Morbid obesity   . Hypertension   . Thyroid disease   . GERD (gastroesophageal reflux disease)   . Hypothyroidism   . Arthritis   . Anemia   . Common bile duct dilatation 10/01/12  . Fatty liver 10/01/12  . Portal vein thrombosis   . Cholelithiasis    . Pancreatic lesion 10/01/12  . Hydroureteronephrosis     right  . Pancreatic mass 2014  . Cancer 10/17/12     pancreatic   . Allergy   . Anxiety   . Neuromuscular disorder     neuropathy   Past Surgical History  Procedure Laterality Date  . Lumbar laminectomy    . Breast biopsy    . Eus N/A 10/17/2012    Procedure: UPPER ENDOSCOPIC ULTRASOUND (EUS) LINEAR;  Surgeon: Milus Banister, MD;  Location: [facility];  Service: Endoscopy;  Laterality: N/A;  . Portacath placement Left 11/19/2012    Procedure: INSERTION PORT-A-CATH;  Surgeon: Stark Klein, MD;  Location: [facility];  Service: General;  Laterality: Left;   History  Substance Use Topics  . Smoking status: Never Smoker   . Smokeless tobacco: Never Used  . Alcohol Use: No   Family History  Problem Relation Age of Onset  . Heart disease Father   . Arthritis Father   . Breast cancer Mother   . Lymphoma Sister     #1  . Diabetes Sister     #1  . Diabetes Sister     #2  . Diabetes Brother     #1  . Diabetes Brother     #2  . Diabetes Other     siblings  . Hypertension Other     siblings  . Arthritis Other  siblings   Allergies  Allergen Reactions  . Oxycodone Hcl Nausea Only  . Latex Rash  . Zithromax [Azithromycin] Other (See Comments)    thrush   Current Outpatient Prescriptions on File Prior to Visit  Medication Sig Dispense Refill  . acetaminophen (TYLENOL) 325 MG tablet Take 650 mg by mouth daily as needed for pain. For pain      . Biotin 5000 MCG CAPS Take 5,000 mcg by mouth at bedtime.       Marland Kitchen doxazosin (CARDURA) 8 MG tablet Take 8 mg by mouth every morning.      . escitalopram (LEXAPRO) 20 MG tablet Take 20 mg by mouth every morning.      . famotidine (PEPCID) 20 MG tablet Take 20 mg by mouth at bedtime.       . furosemide (LASIX) 40 MG tablet Take 40 mg by mouth every morning.       Marland Kitchen HYDROmorphone (DILAUDID) 2 MG tablet Take 0.5-1 tablets (1-2 mg total) by mouth every 4 (four) hours as needed.   50 tablet  0  . Insulin Glargine (LANTUS SOLOSTAR) 100 UNIT/ML SOPN Inject 70 Units into the skin every morning.      Marland Kitchen levothyroxine (SYNTHROID, LEVOTHROID) 88 MCG tablet Take 88 mcg by mouth daily before breakfast. Name brand only      . lidocaine-prilocaine (EMLA) cream Apply 1 application topically as needed. Apply 1 teaspoon to PAC site 1-2 hours prior to stick and cover with plastic wrap to numb site. Do not rub cream in.  30 g  prn  . ondansetron (ZOFRAN) 4 MG tablet TAKE 1 TABLET BY MOUTH EVERY 8 HOURS AS NEEDED FOR NAUSEA  20 tablet  1  . potassium chloride (KLOR-CON) 20 MEQ packet Take 20 mEq by mouth every morning.       . pregabalin (LYRICA) 75 MG capsule Take 75-150 mg by mouth 2 (two) times daily. 75mg  in tje morning and 150mg  at bedtime      . prochlorperazine (COMPAZINE) 10 MG tablet Take 1 tablet (10 mg total) by mouth every 6 (six) hours as needed (nausea).  60 tablet  1  . simvastatin (ZOCOR) 20 MG tablet Take 20 mg by mouth at bedtime.      . temazepam (RESTORIL) 30 MG capsule TAKE 1 CAPSULE BY MOUTH AT BEDTIME AS NEEDED FOR SLEEP  30 capsule  0  . traMADol (ULTRAM) 50 MG tablet Take 1 tablet (50 mg total) by mouth every 6 (six) hours as needed.  60 tablet  2  . warfarin (COUMADIN) 10 MG tablet Take 10 mg by mouth daily. Tuesday, Thursday, Saturday 15mg . Sunday, Monday, wed., Friday 10mg .      . [DISCONTINUED] fesoterodine (TOVIAZ) 4 MG TB24 Take 1 tablet (4 mg total) by mouth at bedtime.  30 tablet  3   No current facility-administered medications on file prior to visit.   The PMH, PSH, Social History, Family History, Medications, and allergies have been reviewed in [facility], and have been updated if relevant.   Review of Systems See HPI    Appetite good now- weight stable Wt Readings from Last 3 Encounters:  04/10/13 221 lb 12 oz (100.585 kg)  04/03/13 221 lb 4.8 oz (100.381 kg)  03/25/13 220 lb (99.791 kg)    Objective:   Physical Exam  Nursing note and vitals  reviewed. Constitutional: She appears well-developed and well-nourished. No distress.  Cardiovascular: Normal rate and regular rhythm.  PMI is not displaced.   Pulmonary/Chest: Effort  normal and breath sounds normal.  Psychiatric: She has a normal mood and affect. Her speech is normal and behavior is normal. Judgment and thought content normal. Cognition and memory are normal.   BP 112/58  Pulse 76  Temp(Src) 98.1 F (36.7 C) (Oral)  Ht 5\' 6"  (1.676 m)  Wt 221 lb 12 oz (100.585 kg)  BMI 35.81 kg/m2  SpO2 96%        Assessment & Plan:

## 2013-04-10 NOTE — Assessment & Plan Note (Signed)
Now with hypotension. D/c Cozaar. She will check BP at home and call me with readings next week. May need to decrease lasix dose as well. The patient indicates understanding of these issues and agrees with the plan.

## 2013-04-10 NOTE — Patient Instructions (Signed)
Good to see you. Please stop taking your Cozaar. Please check your blood pressure at home and call me.

## 2013-04-10 NOTE — Assessment & Plan Note (Signed)
Likely due to cumulative effect of radiation which can take 4-8 weeks to resolve.  Advised her to listen to her body- take naps when she needs them. WBC wnl last month.

## 2013-04-10 NOTE — Assessment & Plan Note (Signed)
Followed by Dr Sherrill.  

## 2013-04-10 NOTE — Progress Notes (Signed)
Pre-visit discussion using our clinic review tool. No additional management support is needed unless otherwise documented below in the visit note.  

## 2013-04-10 NOTE — Assessment & Plan Note (Signed)
Advised adding dulcolax. Call or return to clinic prn if these symptoms worsen or fail to improve as anticipated.

## 2013-04-11 ENCOUNTER — Encounter: Payer: Self-pay | Admitting: Oncology

## 2013-04-11 NOTE — Progress Notes (Signed)
FAXED 15 PAGES TO Tobey Bride, RN CASE MANAGER @ [facility] [phone] PHONE IS [phone] EXT [phone].

## 2013-04-14 ENCOUNTER — Ambulatory Visit (HOSPITAL_BASED_OUTPATIENT_CLINIC_OR_DEPARTMENT_OTHER): Payer: 59 | Admitting: Nurse Practitioner

## 2013-04-14 ENCOUNTER — Ambulatory Visit (HOSPITAL_BASED_OUTPATIENT_CLINIC_OR_DEPARTMENT_OTHER): Payer: 59

## 2013-04-14 ENCOUNTER — Telehealth: Payer: Self-pay | Admitting: Oncology

## 2013-04-14 ENCOUNTER — Telehealth: Payer: Self-pay | Admitting: *Deleted

## 2013-04-14 VITALS — BP 145/61 | HR 84 | Temp 98.9°F | Resp 18 | Ht 66.0 in | Wt 226.4 lb

## 2013-04-14 DIAGNOSIS — C250 Malignant neoplasm of head of pancreas: Secondary | ICD-10-CM

## 2013-04-14 DIAGNOSIS — E1149 Type 2 diabetes mellitus with other diabetic neurological complication: Secondary | ICD-10-CM

## 2013-04-14 DIAGNOSIS — C259 Malignant neoplasm of pancreas, unspecified: Secondary | ICD-10-CM

## 2013-04-14 DIAGNOSIS — I81 Portal vein thrombosis: Secondary | ICD-10-CM

## 2013-04-14 DIAGNOSIS — E1142 Type 2 diabetes mellitus with diabetic polyneuropathy: Secondary | ICD-10-CM

## 2013-04-14 DIAGNOSIS — D638 Anemia in other chronic diseases classified elsewhere: Secondary | ICD-10-CM

## 2013-04-14 LAB — CBC WITH DIFFERENTIAL/PLATELET
BASO%: 0.4 % (ref 0.0–2.0)
Basophils Absolute: 0 10*3/uL (ref 0.0–0.1)
EOS%: 2.6 % (ref 0.0–7.0)
Eosinophils Absolute: 0.1 10*3/uL (ref 0.0–0.5)
HCT: 31.3 % — ABNORMAL LOW (ref 34.8–46.6)
HGB: 9.8 g/dL — ABNORMAL LOW (ref 11.6–15.9)
LYMPH%: 17.7 % (ref 14.0–49.7)
MCH: 25.8 pg (ref 25.1–34.0)
MCHC: 31.3 g/dL — AB (ref 31.5–36.0)
MCV: 82.3 fL (ref 79.5–101.0)
MONO#: 0.5 10*3/uL (ref 0.1–0.9)
MONO%: 9.5 % (ref 0.0–14.0)
NEUT#: 3.3 10*3/uL (ref 1.5–6.5)
NEUT%: 69.8 % (ref 38.4–76.8)
PLATELETS: 126 10*3/uL — AB (ref 145–400)
RBC: 3.8 10*6/uL (ref 3.70–5.45)
RDW: 17.5 % — ABNORMAL HIGH (ref 11.2–14.5)
WBC: 4.8 10*3/uL (ref 3.9–10.3)
lymph#: 0.8 10*3/uL — ABNORMAL LOW (ref 0.9–3.3)

## 2013-04-14 LAB — COMPREHENSIVE METABOLIC PANEL (CC13)
ALT: 24 U/L (ref 0–55)
ANION GAP: 9 meq/L (ref 3–11)
AST: 31 U/L (ref 5–34)
Albumin: 3.2 g/dL — ABNORMAL LOW (ref 3.5–5.0)
Alkaline Phosphatase: 109 U/L (ref 40–150)
BUN: 8.1 mg/dL (ref 7.0–26.0)
CALCIUM: 9.2 mg/dL (ref 8.4–10.4)
CO2: 28 meq/L (ref 22–29)
Chloride: 105 mEq/L (ref 98–109)
Creatinine: 0.7 mg/dL (ref 0.6–1.1)
Glucose: 185 mg/dl — ABNORMAL HIGH (ref 70–140)
Potassium: 4.1 mEq/L (ref 3.5–5.1)
SODIUM: 141 meq/L (ref 136–145)
TOTAL PROTEIN: 6.6 g/dL (ref 6.4–8.3)
Total Bilirubin: 0.38 mg/dL (ref 0.20–1.20)

## 2013-04-14 NOTE — Progress Notes (Addendum)
OFFICE PROGRESS NOTE  Interval history:  Theresa Summers returns prior to scheduled followup. She reports a three-day history of "severe" abdominal pain. The pain is located in various areas. She is taking tramadol and Dilaudid. She notes temporary relief following Dilaudid. The pain worsens after eating. She is nauseated. No vomiting. No bloody or black stools. She has recently been constipated. She used an enema. Bowels have been moving regularly since. She feels her abdomen is distended.   Objective: Filed Vitals:   04/14/13 1453  BP: 145/61  Pulse: 84  Temp: 98.9 F (37.2 C)  Resp: 18   Mild white coating over tongue. Lungs clear. Regular cardiac rhythm. Port-A-Cath site without erythema. Abdomen is distended. Tender at the right abdomen. Question ascites. Bowel sounds active. No hepatomegaly. No mass. Trace lower leg edema bilaterally.   Lab Results: Lab Results  Component Value Date   WBC 6.3 03/10/2013   HGB 11.4* 03/10/2013   HCT 35.5 03/10/2013   MCV 84.5 03/10/2013   PLT 146 03/10/2013   NEUTROABS 4.4 03/10/2013    Chemistry:    Chemistry      Component Value Date/Time   NA 143 02/12/2013 0855   NA 141 01/15/2013 0549   K 4.3 02/12/2013 0855   K 3.0* 01/15/2013 0549   CL 104 01/15/2013 0549   CO2 25 02/12/2013 0855   CO2 28 01/15/2013 0549   BUN 7.2 02/12/2013 0855   BUN 7 01/15/2013 0549   CREATININE 0.7 02/12/2013 0855   CREATININE 0.52 01/15/2013 0549      Component Value Date/Time   CALCIUM 9.1 02/12/2013 0855   CALCIUM 8.5 01/15/2013 0549   ALKPHOS 57 02/12/2013 0855   ALKPHOS 50 01/14/2013 1809   AST 23 02/12/2013 0855   AST 30 01/14/2013 1809   ALT 19 02/12/2013 0855   ALT 30 01/14/2013 1809   BILITOT 0.49 02/12/2013 0855   BILITOT 0.3 01/14/2013 1809       Studies/Results: No results found.  Medications: I have reviewed the patient's current medications.  Assessment/Plan: 1. Adenocarcinoma pancreas, pancreas head mass, endoscopic ultrasound on  10/17/2012 confirmed a pancreas head mass without vascular invasion or surrounding lymphadenopathy, biopsy positive for adenocarcinoma.  Elevated CA 19-9.  Initiation of gemcitabine/Abraxane on a three out of four week schedule on 11/21/2012.  CA 19-9 improved at 571 on 12/25/2012.  CA 19-9 improved at 257 on 01/22/2013  Restaging CT 02/10/2013 with an increase in the pancreas mass and no evidence of progressive metastatic disease  Status post SBRT completed 03/20/2013 2. 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 She is maintained on Coumadin.  3. Diabetes.  4. Right hydronephrosis-? related to history of kidney stones versus carcinomatosis.  5. "Cirrhotic" changes and splenomegaly reported on the chest CT 10/25/2012.  6. History of kidney stones.  7. Hypothyroidism.  8. Normocytic anemia-secondary to chronic disease and chemotherapy, status post a red cell transfusion 01/15/2013.  9. 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  10. Pain secondary to pancreas cancer.  11. Pain/tenderness, edema, erythema right lower leg,resolved 12/25/2012-negative venous duplex 12/25/2012, 01/08/2013 and 01/14/2013. No improvement with multiple courses of antibiotics. Question pseudo-cellulitis secondary to gemcitabine.  12. History of pancytopenia secondary to chemotherapy.   Dispositon-she is experiencing increased abdominal pain. The pain is likely secondary to progressive cancer. We are referring her for CT imaging. We will also obtain a CA 19-9. She will continue tramadol and Dilaudid as needed for  the abdominal pain. She will contact the office if pain is poorly controlled. We will see her as scheduled on 04/24/2013.   Patient seen with Dr. Benay Spice. 25 minutes were spent face-to-face at today's visit with the majority of the time involved in counseling/coordination of care.   Ned Card ANP/GNP-BC    This was a shared visit with Ned Card. She has increased abdominal pain and the abdomen is distended. I will see her after the restaging CT.  Julieanne Manson, M.D.

## 2013-04-14 NOTE — Telephone Encounter (Signed)
gv and printed appt sched and avs for pt for March....sent pt to lab

## 2013-04-14 NOTE — Telephone Encounter (Signed)
Call from pt reporting abdominal pain and nausea. Was "extremely constipated" last week. Saw PCP and was instructed to take Dulcolax in addition to the prunes, Miralax and Senokot she was taking. This was ineffective so she used an enema with good results. Bowels are moving now. Still having nausea without emesis, taking antiemetics. Using Tramadol and morphine for abdominal pain. Asks if she should come in to be seen? Reviewed with Dr. Benay Spice: May be worked in with NP today. Called pt, she would like to be seen in office. Appt given.

## 2013-04-15 LAB — CANCER ANTIGEN 19-9

## 2013-04-16 ENCOUNTER — Other Ambulatory Visit: Payer: Self-pay | Admitting: Oncology

## 2013-04-16 ENCOUNTER — Ambulatory Visit (HOSPITAL_COMMUNITY)
Admission: RE | Admit: 2013-04-16 | Discharge: 2013-04-16 | Disposition: A | Discharge status: Home / Self Care | Payer: 59 | Source: Ambulatory Visit | Attending: Nurse Practitioner | Admitting: Nurse Practitioner

## 2013-04-16 ENCOUNTER — Encounter (HOSPITAL_COMMUNITY): Payer: Self-pay

## 2013-04-16 DIAGNOSIS — C259 Malignant neoplasm of pancreas, unspecified: Secondary | ICD-10-CM | POA: Insufficient documentation

## 2013-04-16 DIAGNOSIS — R188 Other ascites: Secondary | ICD-10-CM | POA: Insufficient documentation

## 2013-04-16 DIAGNOSIS — J90 Pleural effusion, not elsewhere classified: Secondary | ICD-10-CM | POA: Insufficient documentation

## 2013-04-16 DIAGNOSIS — R161 Splenomegaly, not elsewhere classified: Secondary | ICD-10-CM | POA: Insufficient documentation

## 2013-04-16 DIAGNOSIS — R109 Unspecified abdominal pain: Secondary | ICD-10-CM | POA: Insufficient documentation

## 2013-04-16 MED ORDER — IOHEXOL 300 MG/ML  SOLN
100.0000 mL | Freq: Once | INTRAMUSCULAR | Status: AC | PRN
  Administered 2013-04-16: 100 mL via INTRAVENOUS

## 2013-04-17 ENCOUNTER — Telehealth: Payer: Self-pay | Admitting: Oncology

## 2013-04-17 NOTE — Telephone Encounter (Signed)
not able to reach pt on home phone (line busy). called cell (pt identified) and lmonvm re mri appt 3/2 @ [facility] [address] @ 9:15am. no other orders. schedule mailed.

## 2013-04-21 ENCOUNTER — Other Ambulatory Visit (HOSPITAL_COMMUNITY): Payer: 59

## 2013-04-21 ENCOUNTER — Ambulatory Visit
Admission: RE | Admit: 2013-04-21 | Discharge: 2013-04-21 | Disposition: A | Discharge status: Home / Self Care | Payer: 59 | Source: Ambulatory Visit | Attending: Oncology | Admitting: Oncology

## 2013-04-21 DIAGNOSIS — C259 Malignant neoplasm of pancreas, unspecified: Secondary | ICD-10-CM

## 2013-04-21 MED ORDER — GADOXETATE DISODIUM 0.25 MMOL/ML IV SOLN
9.0000 mL | Freq: Once | INTRAVENOUS | Status: AC | PRN
  Administered 2013-04-21: 9 mL via INTRAVENOUS

## 2013-04-24 ENCOUNTER — Ambulatory Visit (HOSPITAL_BASED_OUTPATIENT_CLINIC_OR_DEPARTMENT_OTHER): Payer: 59 | Admitting: Nurse Practitioner

## 2013-04-24 ENCOUNTER — Other Ambulatory Visit (HOSPITAL_BASED_OUTPATIENT_CLINIC_OR_DEPARTMENT_OTHER): Payer: 59

## 2013-04-24 ENCOUNTER — Ambulatory Visit: Payer: Self-pay | Admitting: Radiation Oncology

## 2013-04-24 ENCOUNTER — Telehealth: Payer: Self-pay | Admitting: Oncology

## 2013-04-24 ENCOUNTER — Ambulatory Visit (HOSPITAL_BASED_OUTPATIENT_CLINIC_OR_DEPARTMENT_OTHER): Payer: 59 | Admitting: Pharmacist

## 2013-04-24 VITALS — BP 131/57 | HR 84 | Temp 98.2°F | Resp 18

## 2013-04-24 DIAGNOSIS — G609 Hereditary and idiopathic neuropathy, unspecified: Secondary | ICD-10-CM

## 2013-04-24 DIAGNOSIS — I81 Portal vein thrombosis: Secondary | ICD-10-CM

## 2013-04-24 DIAGNOSIS — R109 Unspecified abdominal pain: Secondary | ICD-10-CM

## 2013-04-24 DIAGNOSIS — C787 Secondary malignant neoplasm of liver and intrahepatic bile duct: Secondary | ICD-10-CM

## 2013-04-24 DIAGNOSIS — C250 Malignant neoplasm of head of pancreas: Secondary | ICD-10-CM

## 2013-04-24 DIAGNOSIS — G893 Neoplasm related pain (acute) (chronic): Secondary | ICD-10-CM

## 2013-04-24 DIAGNOSIS — C259 Malignant neoplasm of pancreas, unspecified: Secondary | ICD-10-CM

## 2013-04-24 LAB — PROTIME-INR
INR: 4.6 — ABNORMAL HIGH (ref 2.00–3.50)
Protime: 55.2 Seconds — ABNORMAL HIGH (ref 10.6–13.4)

## 2013-04-24 LAB — POCT INR: INR: 4.6

## 2013-04-24 NOTE — Progress Notes (Addendum)
OFFICE PROGRESS NOTE  Interval history:  Theresa Summers returns for followup of pancreas cancer. She continues to have abdominal pain. She is taking Dilaudid 2 mg every 4 and half hours as needed. She notes partial pain relief following the Dilaudid. The pain worsens after she eats. Appetite is poor. She is intermittently nauseated. No vomiting. She takes Zofran as needed with good relief. Bowels moving regularly. She continues to note abdominal distention. She reports pre-existing peripheral neuropathy which manifests with a cold sensation in her feet at nighttime.   Objective: Filed Vitals:   04/24/13 1415  BP: 131/57  Pulse: 84  Temp: 98.2 F (36.8 C)  Resp: 18   Mild white coating over tongue. Lungs clear. Regular cardiac rhythm. Port-A-Cath site without erythema. Abdomen is distended; generalized tenderness. No hepatomegaly. Trace firm lower leg edema bilaterally.   Lab Results: Lab Results  Component Value Date   WBC 4.8 04/14/2013   HGB 9.8* 04/14/2013   HCT 31.3* 04/14/2013   MCV 82.3 04/14/2013   PLT 126* 04/14/2013   NEUTROABS 3.3 04/14/2013    Chemistry:    Chemistry      Component Value Date/Time   NA 141 04/14/2013 1543   NA 141 01/15/2013 0549   K 4.1 04/14/2013 1543   K 3.0* 01/15/2013 0549   CL 104 01/15/2013 0549   CO2 28 04/14/2013 1543   CO2 28 01/15/2013 0549   BUN 8.1 04/14/2013 1543   BUN 7 01/15/2013 0549   CREATININE 0.7 04/14/2013 1543   CREATININE 0.52 01/15/2013 0549      Component Value Date/Time   CALCIUM 9.2 04/14/2013 1543   CALCIUM 8.5 01/15/2013 0549   ALKPHOS 109 04/14/2013 1543   ALKPHOS 50 01/14/2013 1809   AST 31 04/14/2013 1543   AST 30 01/14/2013 1809   ALT 24 04/14/2013 1543   ALT 30 01/14/2013 1809   BILITOT 0.38 04/14/2013 1543   BILITOT 0.3 01/14/2013 1809       Studies/Results: Ct Abdomen Pelvis W Contrast  04/16/2013   CLINICAL DATA:  Pancreatic cancer diagnosed in August 2014. Chemotherapy complete. Abdominal pain.  EXAM: CT  ABDOMEN AND PELVIS WITH CONTRAST  TECHNIQUE: Multidetector CT imaging of the abdomen and pelvis was performed using the standard protocol following bolus administration of intravenous contrast.  CONTRAST:  131mL OMNIPAQUE IOHEXOL 300 MG/ML  SOLN  COMPARISON:  CT of the abdomen 02/10/2013.  FINDINGS: Lung Bases: 4 mm subpleural nodule along the lateral aspect of the right major fissure (image 2 of series 9) is unchanged and nonspecific. 5 mm subpleural nodule in the inferior segment of the lingula (image 9 of series 9) is new compared to the prior study, but nonspecific. Small right and trace left pleural effusions appear to be new compared to the prior examination.  Abdomen/Pelvis: Again noted is a complex lesion involving predominantly the inferior aspect of the pancreatic head, which shows evidence of direct invasion into the superior mesenteric vein and portal vein, completely occluding the splenoportal confluence, now extending to the bifurcation of the portal vein, into the proximal right main portal vein, and into the more distal aspects of the left main portal vein and branches in the left hepatic lobe. The largest portion of this tumor appears to be tumor thrombus, which is irregular in shape and best measured on coronal and sagittal reformats where this portion of the tumor is estimated to measure approximately 5.6 x 2.4 x 2.2 cm. This tumor thrombus appears intimately associated with the undersurface of  the common hepatic artery, without an intervening fat plane, which may suggest early invasion. This lesion appears separate from the superior mesenteric artery at this time. In the inferior aspect of the pancreatic head (image 68 of series 3) there is a separate hypovascular focus of tumor measuring 1.6 x 1.5 cm (unchanged). Well-defined 1 cm low-attenuation lesion in the mid body of the pancreas is unchanged compared to the most recent prior study.  Associated with increasing tumor thrombus and greater  involvement of the portal veins there is progressively increased cavernous transformation in the porta hepatis. This is associated with heterogeneous hepatic perfusion. There appear to be multiple new ill-defined hypovascular areas within the liver, several of which are concerning for potential metastasis, largest of which is in the inferior aspect of segment 6 of the liver (image 41 of series 5) measuring approximately 4.1 x 2.5 cm. Innumerable small peripancreatic lymph nodes are conspicuous in number rather than size, and there is diffuse peripancreatic stranding with extension of inflammatory/edematous changes into the surrounding retroperitoneal tissues. Trace volume of ascites, most pronounced in the pericolic gutters bilaterally.  Numerous high attenuation foci lying dependently in the lumen of the gallbladder, compatible with small gallstones. No current findings of acute cholecystitis at this time. Spleen is enlarged measuring 16.9 x 9.9 x 14.1 cm. Multiple small low-attenuation renal lesions bilaterally, several of which are too small to definitively characterize, but the largest lesion measures 2.9 x 2.5 cm in the lateral aspect of the interpolar region of the left kidney, compatible with a small simple cyst. Small volume of ascites tracking into the pelvis. No pneumoperitoneum. No pathologic distention of small bowel.  Musculoskeletal: There are no aggressive appearing lytic or blastic lesions noted in the visualized portions of the skeleton.  IMPRESSION: 1. Today's study demonstrates progression of disease with further expansion of tumor thrombus which now extends from the SMV through splenoportal confluence into the portal venous system involving the main portal vein, proximal right main portal vein, and extensive involvement of the left portal venous system, with increasing cavernous transformation in the porta hepatis and worsening splenomegaly. In addition, there are several new hypovascular areas  in the liver concerning for metastatic disease. These could be confirmed with MRI of the abdomen with and without IV gadolinium if clinically appropriate. 2. Slight interval increased in ascites, and development of new bilateral pleural effusions (small on the right and trace on the left), with increasing soft tissue stranding throughout the retroperitoneum adjacent to the pancreas, as well as increasing number of numerous small peripancreatic and retroperitoneal lymph nodes. 3. Additional incidental findings, as above.   Electronically Signed   By: Vinnie Langton M.D.   On: 04/16/2013 09:25   Mr Liver W Wo Contrast  04/21/2013   CLINICAL DATA:  Pancreatic cancer diagnosed 8/14. Finished chemotherapy and radiation therapy. Nausea and abdominal pain. Evaluate for liver metastasis.  EXAM: MRI ABDOMEN WITHOUT AND WITH CONTRAST  TECHNIQUE: Multiplanar, multisequence MR imaging was performed both before and after administration of intravenous contrast.  CONTRAST:  9cc Eovist, a mixed extracellular and hepatobiliary phase agent  COMPARISON:  CT ABD/PELVIS W CM dated 04/16/2013; CT ABD/PELVIS W CM dated 02/10/2013; CT ABD/PELVIS W CM dated 10/01/2012; CT ANGIO ABDOMEN W/CM &/OR WO/CM dated 10/03/2012  FINDINGS: Small right greater the left bilateral pleural effusions which are not significantly changed. Small volume upper abdominal ascites is also similar.  Hepatomegaly and splenomegaly. Liver 19.6 cm craniocaudal. The spleen measures 14.8 cm craniocaudal.  Heterogeneous hepatic steatosis,  including within the posterior right hepatic lobe on image 19/series 8 and image 35/ series 8. Concurrent extensive bilateral hepatic metastasis. Example lesions within the anterior segment right liver lobe at 1.6 cm on image 16/ series 5. Medial segment left liver lobe on image 16/ series 5 x 1.3 cm. These areas demonstrate restricted diffusion, including on image 59/series 6.  Normal stomach.  Cystic pancreatic neck and head lesions  are similar to on the recent CT.  Thrombus within the superior mesenteric vein, splenoportal confluence, main portal vein, a left greater than right portal veins is similar to on the recent CT.  Normal adrenal glands. Bilateral renal cysts. Mild peripancreatic edema which could be treatment related. Small peripancreatic nodes which are indeterminate.  IMPRESSION: 1. The appearance of the liver on CT is secondary to primarily extensive metastasis. There is concurrent heterogeneous hepatic steatosis. 2. Extensive thrombus within the portal vein, superior mesenteric vein, and branches, similar to on the prior CT. 3. Hepatosplenomegaly and small volume upper abdominal ascites, as before. 4. Similar small bilateral pleural effusions.   Electronically Signed   By: Abigail Miyamoto M.D.   On: 04/21/2013 12:24    Medications: I have reviewed the patient's current medications.  Assessment/Plan: 1. Adenocarcinoma pancreas, pancreas head mass, endoscopic ultrasound on 10/17/2012 confirmed a pancreas head mass without vascular invasion or surrounding lymphadenopathy, biopsy positive for adenocarcinoma.  Elevated CA 19-9.  Initiation of gemcitabine/Abraxane on a three out of four week schedule on 11/21/2012.  CA 19-9 improved at 571 on 12/25/2012.  CA 19-9 improved at 257 on 01/22/2013  Restaging CT 02/10/2013 with an increase in the pancreas mass and no evidence of progressive metastatic disease  Status post SBRT completed 03/20/2013. CA 19-9 greater than 140,000 on 04/14/2013. CT abdomen/pelvis 04/16/2013 showed further expansion of tumor thrombus, worsening splenomegaly; several new hypovascular areas in the liver concerning for metastatic disease ; slight interval increase in ascites and development of new bilateral pleural effusions with increasing soft tissue stranding throughout the retroperitoneum adjacent to the pancreas as well as increasing number of numerous small peripancreatic and retroperitoneal lymph  nodes. MRI liver 04/21/2013 showed heterogeneous hepatic steatosis; concurrent extensive bilateral hepatic metastasis; cystic pancreatic neck and head lesions similar to the recent CT; thrombus within the superior mesenteric vein, splenoportal confluence, main portal vein, left greater than right portal veins similar to the recent CT; small peripancreatic nodes which are indeterminate  2. 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 She is maintained on Coumadin.  3. Diabetes.  4. Right hydronephrosis-? related to history of kidney stones versus carcinomatosis.  5. "Cirrhotic" changes and splenomegaly reported on the chest CT 10/25/2012.  6. History of kidney stones.  7. Hypothyroidism.  8. Normocytic anemia-secondary to chronic disease and chemotherapy, status post a red cell transfusion 01/15/2013.  9. 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  10. Pain secondary to pancreas cancer.  11. Pain/tenderness, edema, erythema right lower leg,resolved 12/25/2012-negative venous duplex 12/25/2012, 01/08/2013 and 01/14/2013. No improvement with multiple courses of antibiotics. Question pseudo-cellulitis secondary to gemcitabine.  12. History of pancytopenia secondary to chemotherapy.   Dispositon-There is evidence of progressive metastatic disease involving the liver on the recent MRI. Dr. Benay Spice reviewed the CT and MRI reports as well as the images with Theresa Summers and her family.  Dr. Benay Spice discussed salvage chemotherapy on the FOLFOX regimen versus a supportive care approach. She would like to proceed with chemotherapy. We reviewed potential  toxicities associated with oxaliplatin including myelosuppression, allergic reaction, neurotoxicity including cold sensitivity, peripheral neuropathy and acute laryngopharyngeal dysesthesia as well as more rare occurrences of diplopia and ataxia,  nausea. We reviewed potential toxicities associated with 5-fluorouracil including mouth sores, nausea, diarrhea, hand-foot syndrome, skin hyperpigmentation, skin rash. Written information was provided as well.  She is symptomatic with abdominal pain. She is currently taking Dilaudid 2 mg every 4 or so hours as needed with partial relief. We instructed her to increase the Dilaudid to 2-4 mg every 3 hours as needed and to contact the office if this was not effective.  She will return to begin cycle 1 FOLFOX on 04/30/2013. We will see her in followup on 05/14/2013.  Patient seen with Dr. Benay Spice. 45 minutes were spent face-to-face at today's visit with the majority of that time involved in counseling/coordination of care.   Ned Card ANP/GNP-BC  This was a shared visit with Ned Card. I reviewed the CT and MRI images with Theresa Summers and her family. There is clinical and x-ray evidence of disease progression. She has metastatic pancreas cancer. We discussed treatment options including supportive care versus a trial of salvage chemotherapy. I think it would be difficult for Theresa Summers to tolerate FOLFIRINOX. I recommend FOLFOX chemotherapy. Theresa Summers would like to proceed with chemotherapy. The goal is to improve her pain and potentially extend her lifespan. Cycle 1 FOLFOX will be scheduled on 04/30/2013.  Julieanne Manson, M.D.

## 2013-04-24 NOTE — Progress Notes (Signed)
INR = 4.6    Goal 2-3 INR supratherapeutic today. No bleeding or bruising noted. Patient's only medication change is discontinuation of Cozaar on 04/10/13. She has been eating much less greens than usual. She will skip her Coumadin dose today and eat a helping of greens with dinner tonight. She will resume Coumadin tomorrow at a decreased dose of 7.5 mg daily. She has an appointment with Dr. Lisbeth Renshaw next Wednesday and we will schedule a Coumadin Clinic on that date. 04/30/13 Lab 9:30, Coumadin Clinic 9:45, and Dr. Lisbeth Renshaw at 10:15.  (Note:  3/11 is her wedding anniversary)  Theone Murdoch, PharmD

## 2013-04-24 NOTE — Telephone Encounter (Signed)
Gave pt appt for lab and MD , emailed Michelle regarding chemo for March 2015 °

## 2013-04-25 ENCOUNTER — Telehealth: Payer: Self-pay | Admitting: Oncology

## 2013-04-25 ENCOUNTER — Telehealth: Payer: Self-pay | Admitting: *Deleted

## 2013-04-25 LAB — CANCER ANTIGEN 19-9: CA 19-9: >140000 U/mL — ABNORMAL HIGH (ref <35.0)

## 2013-04-25 NOTE — Telephone Encounter (Signed)
Per staff message and POF I have scheduled appts.  Advised scheduler that no availabale for treatment on 3/11 moved to 3/12  JMW

## 2013-04-25 NOTE — Telephone Encounter (Signed)
Gave pt appt for for lab,md and chemo for march 2015

## 2013-04-28 ENCOUNTER — Other Ambulatory Visit: Payer: Self-pay | Admitting: Oncology

## 2013-04-28 ENCOUNTER — Other Ambulatory Visit: Payer: Self-pay | Admitting: Family Medicine

## 2013-04-28 DIAGNOSIS — C259 Malignant neoplasm of pancreas, unspecified: Secondary | ICD-10-CM

## 2013-04-28 NOTE — Telephone Encounter (Signed)
Pt requesting medication refill. Last ov 03/2012 with no future appts scheduled. Med on Hx list only. pls advise

## 2013-04-29 NOTE — Progress Notes (Signed)
Patient ID: Theresa Summers, female   DOB: September 14, 1940, 73 y.o.   MRN: [id number] ATTENDING PHYSICIAN NOTE: I have reviewed the chart and agree with the plan as detailed above. Dorcas Mcmurray MD Pager [phone]

## 2013-04-30 ENCOUNTER — Other Ambulatory Visit: Payer: 59

## 2013-04-30 ENCOUNTER — Ambulatory Visit: Payer: 59

## 2013-04-30 ENCOUNTER — Ambulatory Visit: Payer: 59 | Admitting: Radiation Oncology

## 2013-05-01 ENCOUNTER — Other Ambulatory Visit (HOSPITAL_BASED_OUTPATIENT_CLINIC_OR_DEPARTMENT_OTHER): Payer: 59

## 2013-05-01 ENCOUNTER — Ambulatory Visit (HOSPITAL_BASED_OUTPATIENT_CLINIC_OR_DEPARTMENT_OTHER): Payer: 59

## 2013-05-01 ENCOUNTER — Ambulatory Visit (HOSPITAL_BASED_OUTPATIENT_CLINIC_OR_DEPARTMENT_OTHER): Payer: Self-pay | Admitting: Pharmacist

## 2013-05-01 ENCOUNTER — Other Ambulatory Visit: Payer: Self-pay | Admitting: *Deleted

## 2013-05-01 DIAGNOSIS — I81 Portal vein thrombosis: Secondary | ICD-10-CM

## 2013-05-01 DIAGNOSIS — C250 Malignant neoplasm of head of pancreas: Secondary | ICD-10-CM

## 2013-05-01 DIAGNOSIS — Z5111 Encounter for antineoplastic chemotherapy: Secondary | ICD-10-CM

## 2013-05-01 DIAGNOSIS — C787 Secondary malignant neoplasm of liver and intrahepatic bile duct: Secondary | ICD-10-CM

## 2013-05-01 DIAGNOSIS — C259 Malignant neoplasm of pancreas, unspecified: Secondary | ICD-10-CM

## 2013-05-01 DIAGNOSIS — N1330 Unspecified hydronephrosis: Secondary | ICD-10-CM

## 2013-05-01 LAB — CBC WITH DIFFERENTIAL/PLATELET
BASO%: 0.6 % (ref 0.0–2.0)
Basophils Absolute: 0 10*3/uL (ref 0.0–0.1)
EOS ABS: 0.2 10*3/uL (ref 0.0–0.5)
EOS%: 3 % (ref 0.0–7.0)
HEMATOCRIT: 33.3 % — AB (ref 34.8–46.6)
HGB: 10.2 g/dL — ABNORMAL LOW (ref 11.6–15.9)
LYMPH%: 13.8 % — ABNORMAL LOW (ref 14.0–49.7)
MCH: 25.2 pg (ref 25.1–34.0)
MCHC: 30.6 g/dL — ABNORMAL LOW (ref 31.5–36.0)
MCV: 82.4 fL (ref 79.5–101.0)
MONO#: 0.5 10*3/uL (ref 0.1–0.9)
MONO%: 9.2 % (ref 0.0–14.0)
NEUT%: 73.4 % (ref 38.4–76.8)
NEUTROS ABS: 3.7 10*3/uL (ref 1.5–6.5)
Platelets: 123 10*3/uL — ABNORMAL LOW (ref 145–400)
RBC: 4.04 10*6/uL (ref 3.70–5.45)
RDW: 16.8 % — ABNORMAL HIGH (ref 11.2–14.5)
WBC: 5 10*3/uL (ref 3.9–10.3)
lymph#: 0.7 10*3/uL — ABNORMAL LOW (ref 0.9–3.3)
nRBC: 0 % (ref 0–0)

## 2013-05-01 LAB — POCT INR: INR: 3.8

## 2013-05-01 LAB — COMPREHENSIVE METABOLIC PANEL (CC13)
ALK PHOS: 143 U/L (ref 40–150)
ALT: 25 U/L (ref 0–55)
ANION GAP: 10 meq/L (ref 3–11)
AST: 32 U/L (ref 5–34)
Albumin: 3.2 g/dL — ABNORMAL LOW (ref 3.5–5.0)
BUN: 9 mg/dL (ref 7.0–26.0)
CALCIUM: 9.4 mg/dL (ref 8.4–10.4)
CO2: 27 meq/L (ref 22–29)
Chloride: 102 mEq/L (ref 98–109)
Creatinine: 0.8 mg/dL (ref 0.6–1.1)
GLUCOSE: 219 mg/dL — AB (ref 70–140)
Potassium: 3.6 mEq/L (ref 3.5–5.1)
Sodium: 140 mEq/L (ref 136–145)
TOTAL PROTEIN: 6.6 g/dL (ref 6.4–8.3)
Total Bilirubin: 0.45 mg/dL (ref 0.20–1.20)

## 2013-05-01 LAB — PROTIME-INR
INR: 3.8 — AB (ref 2.00–3.50)
PROTIME: 45.6 s — AB (ref 10.6–13.4)

## 2013-05-01 MED ORDER — ONDANSETRON 8 MG/NS 50 ML IVPB
INTRAVENOUS | Status: AC
  Filled 2013-05-01: qty 8

## 2013-05-01 MED ORDER — LEUCOVORIN CALCIUM INJECTION 350 MG
400.0000 mg/m2 | Freq: Once | INTRAVENOUS | Status: AC
  Administered 2013-05-01: 876 mg via INTRAVENOUS
  Filled 2013-05-01: qty 43.8

## 2013-05-01 MED ORDER — DEXAMETHASONE SODIUM PHOSPHATE 10 MG/ML IJ SOLN
INTRAMUSCULAR | Status: AC
  Filled 2013-05-01: qty 1

## 2013-05-01 MED ORDER — HYDROMORPHONE HCL 2 MG PO TABS: 2.0000 mg | ORAL_TABLET | ORAL | Status: DC | PRN

## 2013-05-01 MED ORDER — FLUOROURACIL CHEMO INJECTION 2.5 GM/50ML
400.0000 mg/m2 | Freq: Once | INTRAVENOUS | Status: AC
  Administered 2013-05-01: 900 mg via INTRAVENOUS
  Filled 2013-05-01: qty 18

## 2013-05-01 MED ORDER — DEXAMETHASONE SODIUM PHOSPHATE 10 MG/ML IJ SOLN
10.0000 mg | Freq: Once | INTRAMUSCULAR | Status: AC
  Administered 2013-05-01: 10 mg via INTRAVENOUS

## 2013-05-01 MED ORDER — DEXTROSE 5 % IV SOLN
Freq: Once | INTRAVENOUS | Status: AC
  Administered 2013-05-01: 13:00:00 via INTRAVENOUS

## 2013-05-01 MED ORDER — ONDANSETRON 8 MG/50ML IVPB (CHCC)
8.0000 mg | Freq: Once | INTRAVENOUS | Status: AC
  Administered 2013-05-01: 8 mg via INTRAVENOUS

## 2013-05-01 MED ORDER — SODIUM CHLORIDE 0.9 % IV SOLN
1920.0000 mg/m2 | INTRAVENOUS | Status: DC
  Administered 2013-05-01: 4200 mg via INTRAVENOUS
  Filled 2013-05-01: qty 84

## 2013-05-01 MED ORDER — DEXTROSE 5 % IV SOLN
85.0000 mg/m2 | Freq: Once | INTRAVENOUS | Status: AC
  Administered 2013-05-01: 185 mg via INTRAVENOUS
  Filled 2013-05-01: qty 37

## 2013-05-01 NOTE — Progress Notes (Signed)
INR above goal again likely due to appetite being poor Pt is seen in infusion today to start salvage chemotherapy with FOLFOX. Pt is doing well except for she has not been eating as well due to poor appetite and abdominal pain Pt also states she experiences nausea from time to time I re-enforced the use of her anti-nausea medications with zofran and compazine and how/when to use these.  With regards to her coumadin, I think due to her diet/lack of a normal appetite I will decrease her dose by ~20% and check another INR next week If this is too much of a dose decrease or her appetite improves we can increase her back up next week. Plan: Skip Coumadin tonight, then decrease dose to 5 mg daily and 7.5 mg on Monday, Wednesday, and Fridays.  Recheck INR in 1 week on 05/08/13 at 11am

## 2013-05-01 NOTE — Patient Instructions (Addendum)
INR above goal again likely due to appetite being low Skip Coumadin tonight, then decrease dose to 5 mg daily and 7.5 mg on Monday, Wednesday, and Fridays.  Recheck INR in 1 week on Thursday 05/08/13 at 11am for lab and 11:15am for coumadin clinic

## 2013-05-01 NOTE — Patient Instructions (Signed)
[facility] Discharge Instructions for Patients Receiving ChemotherapyFluorouracil, 5-FU injection What is this medicine? FLUOROURACIL, 5-FU (flure oh YOOR a sil) is a chemotherapy drug. It slows the growth of cancer cells. This medicine is used to treat many types of cancer like breast cancer, colon or rectal cancer, pancreatic cancer, and stomach cancer. This medicine may be used for other purposes; ask your health care provider or pharmacist if you have questions. COMMON BRAND NAME(S): Adrucil What should I tell my health care provider before I take this medicine? They need to know if you have any of these conditions: -blood disorders -dihydropyrimidine dehydrogenase (DPD) deficiency -infection (especially a virus infection such as chickenpox, cold sores, or herpes) -kidney disease -liver disease -malnourished, poor nutrition -recent or ongoing radiation therapy -an unusual or allergic reaction to fluorouracil, other chemotherapy, other medicines, foods, dyes, or preservatives -pregnant or trying to get pregnant -breast-feeding How should I use this medicine? This drug is given as an infusion or injection into a vein. It is administered in a hospital or clinic by a specially trained health care professional. Talk to your pediatrician regarding the use of this medicine in children. Special care may be needed. Overdosage: If you think you have taken too much of this medicine contact a poison control center or emergency room at once. NOTE: This medicine is only for you. Do not share this medicine with others. What if I miss a dose? It is important not to miss your dose. Call your doctor or health care professional if you are unable to keep an appointment. What may interact with this medicine? -allopurinol -cimetidine -dapsone -digoxin -hydroxyurea -leucovorin -levamisole -medicines for seizures like ethotoin, fosphenytoin, phenytoin -medicines to increase blood counts  like filgrastim, pegfilgrastim, sargramostim -medicines that treat or prevent blood clots like warfarin, enoxaparin, and dalteparin -methotrexate -metronidazole -pyrimethamine -some other chemotherapy drugs like busulfan, cisplatin, estramustine, vinblastine -trimethoprim -trimetrexate -vaccines Talk to your doctor or health care professional before taking any of these medicines: -acetaminophen -aspirin -ibuprofen -ketoprofen -naproxen This list may not describe all possible interactions. Give your health care provider a list of all the medicines, herbs, non-prescription drugs, or dietary supplements you use. Also tell them if you smoke, drink alcohol, or use illegal drugs. Some items may interact with your medicine. What should I watch for while using this medicine? Visit your doctor for checks on your progress. This drug may make you feel generally unwell. This is not uncommon, as chemotherapy can affect healthy cells as well as cancer cells. Report any side effects. Continue your course of treatment even though you feel ill unless your doctor tells you to stop. In some cases, you may be given additional medicines to help with side effects. Follow all directions for their use. Call your doctor or health care professional for advice if you get a fever, chills or sore throat, or other symptoms of a cold or flu. Do not treat yourself. This drug decreases your body's ability to fight infections. Try to avoid being around people who are sick. This medicine may increase your risk to bruise or bleed. Call your doctor or health care professional if you notice any unusual bleeding. Be careful brushing and flossing your teeth or using a toothpick because you may get an infection or bleed more easily. If you have any dental work done, tell your dentist you are receiving this medicine. Avoid taking products that contain aspirin, acetaminophen, ibuprofen, naproxen, or ketoprofen unless instructed by your  doctor. These medicines may  hide a fever. Do not become pregnant while taking this medicine. Women should inform their doctor if they wish to become pregnant or think they might be pregnant. There is a potential for serious side effects to an unborn child. Talk to your health care professional or pharmacist for more information. Do not breast-feed an infant while taking this medicine. Men should inform their doctor if they wish to father a child. This medicine may lower sperm counts. Do not treat diarrhea with over the counter products. Contact your doctor if you have diarrhea that lasts more than 2 days or if it is severe and watery. This medicine can make you more sensitive to the sun. Keep out of the sun. If you cannot avoid being in the sun, wear protective clothing and use sunscreen. Do not use sun lamps or tanning beds/booths. What side effects may I notice from receiving this medicine? Side effects that you should report to your doctor or health care professional as soon as possible: -allergic reactions like skin rash, itching or hives, swelling of the face, lips, or tongue -low blood counts - this medicine may decrease the number of white blood cells, red blood cells and platelets. You may be at increased risk for infections and bleeding. -signs of infection - fever or chills, cough, sore throat, pain or difficulty passing urine -signs of decreased platelets or bleeding - bruising, pinpoint red spots on the skin, black, tarry stools, blood in the urine -signs of decreased red blood cells - unusually weak or tired, fainting spells, lightheadedness -breathing problems -changes in vision -chest pain -mouth sores -nausea and vomiting -pain, swelling, redness at site where injected -pain, tingling, numbness in the hands or feet -redness, swelling, or sores on hands or feet -stomach pain -unusual bleeding Side effects that usually do not require medical attention (report to your doctor or  health care professional if they continue or are bothersome): -changes in finger or toe nails -diarrhea -dry or itchy skin -hair loss -headache -loss of appetite -sensitivity of eyes to the light -stomach upset -unusually teary eyes This list may not describe all possible side effects. Call your doctor for medical advice about side effects. You may report side effects to FDA at 1-800-FDA-1088. Where should I keep my medicine? This drug is given in a hospital or clinic and will not be stored at home. NOTE: This sheet is a summary. It may not cover all possible information. If you have questions about this medicine, talk to your doctor, pharmacist, or health care provider.  2014, Elsevier/Gold Standard. (2007-06-12 13:53:16) Oxaliplatin Injection What is this medicine? OXALIPLATIN (ox AL i PLA tin) is a chemotherapy drug. It targets fast dividing cells, like cancer cells, and causes these cells to die. This medicine is used to treat cancers of the colon and rectum, and many other cancers. This medicine may be used for other purposes; ask your health care provider or pharmacist if you have questions. COMMON BRAND NAME(S): Eloxatin What should I tell my health care provider before I take this medicine? They need to know if you have any of these conditions: -kidney disease -an unusual or allergic reaction to oxaliplatin, other chemotherapy, other medicines, foods, dyes, or preservatives -pregnant or trying to get pregnant -breast-feeding How should I use this medicine? This drug is given as an infusion into a vein. It is administered in a hospital or clinic by a specially trained health care professional. Talk to your pediatrician regarding the use of this medicine in children.  Special care may be needed. Overdosage: If you think you have taken too much of this medicine contact a poison control center or emergency room at once. NOTE: This medicine is only for you. Do not share this medicine  with others. What if I miss a dose? It is important not to miss a dose. Call your doctor or health care professional if you are unable to keep an appointment. What may interact with this medicine? -medicines to increase blood counts like filgrastim, pegfilgrastim, sargramostim -probenecid -some antibiotics like amikacin, gentamicin, neomycin, polymyxin B, streptomycin, tobramycin -zalcitabine Talk to your doctor or health care professional before taking any of these medicines: -acetaminophen -aspirin -ibuprofen -ketoprofen -naproxen This list may not describe all possible interactions. Give your health care provider a list of all the medicines, herbs, non-prescription drugs, or dietary supplements you use. Also tell them if you smoke, drink alcohol, or use illegal drugs. Some items may interact with your medicine. What should I watch for while using this medicine? Your condition will be monitored carefully while you are receiving this medicine. You will need important blood work done while you are taking this medicine. This medicine can make you more sensitive to cold. Do not drink cold drinks or use ice. Cover exposed skin before coming in contact with cold temperatures or cold objects. When out in cold weather wear warm clothing and cover your mouth and nose to warm the air that goes into your lungs. Tell your doctor if you get sensitive to the cold. This drug may make you feel generally unwell. This is not uncommon, as chemotherapy can affect healthy cells as well as cancer cells. Report any side effects. Continue your course of treatment even though you feel ill unless your doctor tells you to stop. In some cases, you may be given additional medicines to help with side effects. Follow all directions for their use. Call your doctor or health care professional for advice if you get a fever, chills or sore throat, or other symptoms of a cold or flu. Do not treat yourself. This drug decreases  your body's ability to fight infections. Try to avoid being around people who are sick. This medicine may increase your risk to bruise or bleed. Call your doctor or health care professional if you notice any unusual bleeding. Be careful brushing and flossing your teeth or using a toothpick because you may get an infection or bleed more easily. If you have any dental work done, tell your dentist you are receiving this medicine. Avoid taking products that contain aspirin, acetaminophen, ibuprofen, naproxen, or ketoprofen unless instructed by your doctor. These medicines may hide a fever. Do not become pregnant while taking this medicine. Women should inform their doctor if they wish to become pregnant or think they might be pregnant. There is a potential for serious side effects to an unborn child. Talk to your health care professional or pharmacist for more information. Do not breast-feed an infant while taking this medicine. Call your doctor or health care professional if you get diarrhea. Do not treat yourself. What side effects may I notice from receiving this medicine? Side effects that you should report to your doctor or health care professional as soon as possible: -allergic reactions like skin rash, itching or hives, swelling of the face, lips, or tongue -low blood counts - This drug may decrease the number of white blood cells, red blood cells and platelets. You may be at increased risk for infections and bleeding. -signs of infection -  fever or chills, cough, sore throat, pain or difficulty passing urine -signs of decreased platelets or bleeding - bruising, pinpoint red spots on the skin, black, tarry stools, nosebleeds -signs of decreased red blood cells - unusually weak or tired, fainting spells, lightheadedness -breathing problems -chest pain, pressure -cough -diarrhea -jaw tightness -mouth sores -nausea and vomiting -pain, swelling, redness or irritation at the injection site -pain,  tingling, numbness in the hands or feet -problems with balance, talking, walking -redness, blistering, peeling or loosening of the skin, including inside the mouth -trouble passing urine or change in the amount of urine Side effects that usually do not require medical attention (report to your doctor or health care professional if they continue or are bothersome): -changes in vision -constipation -hair loss -loss of appetite -metallic taste in the mouth or changes in taste -stomach pain This list may not describe all possible side effects. Call your doctor for medical advice about side effects. You may report side effects to FDA at 1-800-FDA-1088. Where should I keep my medicine? This drug is given in a hospital or clinic and will not be stored at home. NOTE: This sheet is a summary. It may not cover all possible information. If you have questions about this medicine, talk to your doctor, pharmacist, or health care provider.  2014, Elsevier/Gold Standard. (2007-09-03 17:22:47)   Today you received the following chemotherapy agents :  Leucovorin, 5 fu, oxaliplatin To help prevent nausea and vomiting after your treatment, we encourage you to take your nausea medication  Leucovorin injection What is this medicine? LEUCOVORIN (loo koe VOR in) is used to prevent or treat the harmful effects of some medicines. This medicine is used to treat anemia caused by a low amount of folic acid in the body. It is also used with 5-fluorouracil (5-FU) to treat colon cancer. This medicine may be used for other purposes; ask your health care provider or pharmacist if you have questions. What should I tell my health care provider before I take this medicine? They need to know if you have any of these conditions: -anemia from low levels of vitamin B-12 in the blood -an unusual or allergic reaction to leucovorin, folic acid, other medicines, foods, dyes, or preservatives -pregnant or trying to get  pregnant -breast-feeding How should I use this medicine? This medicine is for injection into a muscle or into a vein. It is given by a health care professional in a hospital or clinic setting. Talk to your pediatrician regarding the use of this medicine in children. Special care may be needed. Overdosage: If you think you have taken too much of this medicine contact a poison control center or emergency room at once. NOTE: This medicine is only for you. Do not share this medicine with others. What if I miss a dose? This does not apply. What may interact with this medicine? -capecitabine -fluorouracil -phenobarbital -phenytoin -primidone -trimethoprim-sulfamethoxazole This list may not describe all possible interactions. Give your health care provider a list of all the medicines, herbs, non-prescription drugs, or dietary supplements you use. Also tell them if you smoke, drink alcohol, or use illegal drugs. Some items may interact with your medicine. What should I watch for while using this medicine? Your condition will be monitored carefully while you are receiving this medicine. This medicine may increase the side effects of 5-fluorouracil, 5-FU. Tell your doctor or health care professional if you have diarrhea or mouth sores that do not get better or that get worse. What side effects  may I notice from receiving this medicine? Side effects that you should report to your doctor or health care professional as soon as possible: -allergic reactions like skin rash, itching or hives, swelling of the face, lips, or tongue -breathing problems -fever, infection -mouth sores -unusual bleeding or bruising -unusually weak or tired Side effects that usually do not require medical attention (report to your doctor or health care professional if they continue or are bothersome): -constipation or diarrhea -loss of appetite -nausea, vomiting This list may not describe all possible side effects. Call  your doctor for medical advice about side effects. You may report side effects to FDA at 1-800-FDA-1088. Where should I keep my medicine? This drug is given in a hospital or clinic and will not be stored at home. NOTE: This sheet is a summary. It may not cover all possible information. If you have questions about this medicine, talk to your doctor, pharmacist, or health care provider.  2014, Elsevier/Gold Standard. (2007-08-13 16:50:29) If you develop nausea and vomiting that is not controlled by your nausea medication, call the clinic.   BELOW ARE SYMPTOMS THAT SHOULD BE REPORTED IMMEDIATELY:  *FEVER GREATER THAN 100.5 F  *CHILLS WITH OR WITHOUT FEVER  NAUSEA AND VOMITING THAT IS NOT CONTROLLED WITH YOUR NAUSEA MEDICATION  *UNUSUAL SHORTNESS OF BREATH  *UNUSUAL BRUISING OR BLEEDING  TENDERNESS IN MOUTH AND THROAT WITH OR WITHOUT PRESENCE OF ULCERS  *URINARY PROBLEMS  *BOWEL PROBLEMS  UNUSUAL RASH Items with * indicate a potential emergency and should be followed up as soon as possible.  Feel free to call the clinic you have any questions or concerns. The clinic phone number is [phone].

## 2013-05-01 NOTE — Progress Notes (Signed)
Pt teaching done on FOLFOX & CADD/pump. Spill kit & instructions given.  Pt very anxious about pump.  She has 1-800 # for problems after hours.  Pump programed to infuse over 43 hours due to starting late & need to have pump d/c'd on Sat. Husband was also there & heard instructions.

## 2013-05-02 ENCOUNTER — Other Ambulatory Visit: Payer: Self-pay | Admitting: Oncology

## 2013-05-02 ENCOUNTER — Telehealth: Payer: Self-pay | Admitting: Nurse Practitioner

## 2013-05-02 ENCOUNTER — Other Ambulatory Visit: Payer: Self-pay | Admitting: *Deleted

## 2013-05-02 ENCOUNTER — Telehealth: Payer: Self-pay | Admitting: *Deleted

## 2013-05-02 ENCOUNTER — Other Ambulatory Visit: Payer: Self-pay | Admitting: Nurse Practitioner

## 2013-05-02 DIAGNOSIS — C259 Malignant neoplasm of pancreas, unspecified: Secondary | ICD-10-CM

## 2013-05-02 MED ORDER — LORAZEPAM 0.5 MG PO TABS: 0.5000 mg | ORAL_TABLET | Freq: Three times a day (TID) | ORAL | Status: DC

## 2013-05-02 MED ORDER — ONDANSETRON 8 MG PO TBDP: 8.0000 mg | ORAL_TABLET | Freq: Three times a day (TID) | ORAL | Status: DC | PRN

## 2013-05-02 MED ORDER — HYDROMORPHONE HCL 1 MG/ML PO LIQD: 2.0000 mg | ORAL | Status: DC | PRN

## 2013-05-02 NOTE — Telephone Encounter (Signed)
Orders received and read back from Ned Card for zofran ODT and lorazepam for nausea.  Instructed to go to ED for severe uncontrolled pain.  Hesitancy noted asking can we do something about her pain when she comes in tomorrow for pump disconnect.  Instructed not to wait until tomorrow but to go to ED.  Expressed she would like to begin liquid pain medicines.  Order received for liquid dilaudid.  Notified [employer] [facility] has liquid pain medicine in stock.  Asked what to do with other medicines and insulin.  Suggested she ask Dr. Buddy Duty and pharmacist how to manage Lantus insulin.  Reports blood sugar this morning = 200 after eating the applesauce.  Reviewed how to check CBG 30 minutes before meals and at bedtime for accurate results.  No further questions.

## 2013-05-02 NOTE — Telephone Encounter (Signed)
   Provider input needed: Pain, nausea, vomiting   Reason for call: Worst pain ever and vomiting  Gastrointestinal: positive for nausea and vomiting Sharp abdominal pain   Patient last received chemotherapy/ treatment on 05-01-2013 FOLFOX  Patient was last seen in the office on 04-24-2013  Next appt is 05-14-2013  Is patient having fevers greater than 100.5?  no   Is patient having uncontrolled pain, or new pain? yes, "Hydromorphone last night, got stuck in mouth so crushed next dose and this morning vomited and lost dose in emesis"   Is patient having new back pain that changes with position (worsens or eases when laying down?)  no, "I am lying on my back most of the time as this is most comfortable."   Is patient able to eat and drink? no, I ate a grilled chicken sandwich after chemotherapy yesterday, vomited immediately, this morning I ate applesauce and vomited.    Is patient able to pass stool without difficulty?   yes, "small soft stool this morning"     Is patient having uncontrolled nausea?  yes, "Took ondansetron last night and this morning.  Vomited after this morning's dose"    patient calls 05/02/2013 with complaint of  Gastrointestinal: positive for nausea and vomiting "sharp stomach pain, worst pain I've had an eight out of ten on the pain scale and vomiting, tried hot water bottle, I'm crying out in pain, I need something done today, did not sleep well, out of restoril and Pharmacy closed.used an [place] from my daughter with minimal rest"    Summary Based on the above information advised patient to  Try to drink room temperature Ginger ale, sports drinks and await return call.  Can be reached at [phone] or cell: [phone].   Theresa Summers, Theresa Summers  05/02/2013, 9:42 AM   Background Info  [name]   DOB: March 09, 1940   MR#: [id number]   CSN#   [id number] 05/02/2013

## 2013-05-02 NOTE — Telephone Encounter (Signed)
No new note. 

## 2013-05-02 NOTE — Telephone Encounter (Signed)
I contacted Ms. Saperstein to followup on her earlier phone call regarding nausea and abdominal pain. She developed nausea following chemotherapy yesterday. She tried Zofran and vomited the dose. She continues to be nauseated. She noted increased abdominal pain (in the same location as previous) beginning yesterday evening. She is having difficulty taking the Dilaudid pills and would like to try a liquid.  We sent prescriptions for Zofran ODT 8 mg every 8 hours as needed and Ativan 0.5 mg sublingual every 8 hours as needed to her pharmacy. She will pick up a prescription at the office for Dilaudid liquid 1 mg per mL with instructions to take 2 mg every 4 hours as needed.  I offered to see her in an office visit today. She prefers to try the above measures first. She understands to contact the office if she has persistent nausea and/or poor pain control. She understands to go to the emergency department with any change in her condition.

## 2013-05-02 NOTE — Telephone Encounter (Addendum)
Verbal order received and read back from Ned Card for patient to try these anti-emetics.  Continue pain medicine with control of nausea and to report to ED if severe, uncontrolled.pain.  Called patient with instructions.  Very concerned about pain asking "tomorrow when she comes in for her injection will they take care of my pain?"  Expressed that she needs to go to ED for pain that is debilitating, causing clamminess, s.o.b, or she'll never catch up with the pain.  Asked for a liquid pain medicine.  Will notify providers.

## 2013-05-02 NOTE — Telephone Encounter (Signed)
error 

## 2013-05-03 ENCOUNTER — Emergency Department (HOSPITAL_COMMUNITY)
Admission: EM | Admit: 2013-05-03 | Discharge: 2013-05-03 | Disposition: A | Discharge status: Home / Self Care | Payer: 59 | Attending: Emergency Medicine | Admitting: Emergency Medicine

## 2013-05-03 ENCOUNTER — Ambulatory Visit (HOSPITAL_BASED_OUTPATIENT_CLINIC_OR_DEPARTMENT_OTHER): Payer: 59

## 2013-05-03 ENCOUNTER — Encounter (HOSPITAL_COMMUNITY): Payer: Self-pay | Admitting: Emergency Medicine

## 2013-05-03 VITALS — BP 144/61 | HR 65 | Temp 98.2°F | Resp 18

## 2013-05-03 DIAGNOSIS — M129 Arthropathy, unspecified: Secondary | ICD-10-CM | POA: Insufficient documentation

## 2013-05-03 DIAGNOSIS — C259 Malignant neoplasm of pancreas, unspecified: Secondary | ICD-10-CM

## 2013-05-03 DIAGNOSIS — Z794 Long term (current) use of insulin: Secondary | ICD-10-CM | POA: Insufficient documentation

## 2013-05-03 DIAGNOSIS — E039 Hypothyroidism, unspecified: Secondary | ICD-10-CM | POA: Insufficient documentation

## 2013-05-03 DIAGNOSIS — E119 Type 2 diabetes mellitus without complications: Secondary | ICD-10-CM | POA: Insufficient documentation

## 2013-05-03 DIAGNOSIS — E785 Hyperlipidemia, unspecified: Secondary | ICD-10-CM | POA: Insufficient documentation

## 2013-05-03 DIAGNOSIS — G589 Mononeuropathy, unspecified: Secondary | ICD-10-CM | POA: Insufficient documentation

## 2013-05-03 DIAGNOSIS — F411 Generalized anxiety disorder: Secondary | ICD-10-CM | POA: Insufficient documentation

## 2013-05-03 DIAGNOSIS — Z7901 Long term (current) use of anticoagulants: Secondary | ICD-10-CM | POA: Insufficient documentation

## 2013-05-03 DIAGNOSIS — Z79899 Other long term (current) drug therapy: Secondary | ICD-10-CM | POA: Insufficient documentation

## 2013-05-03 DIAGNOSIS — K219 Gastro-esophageal reflux disease without esophagitis: Secondary | ICD-10-CM | POA: Insufficient documentation

## 2013-05-03 DIAGNOSIS — Z7982 Long term (current) use of aspirin: Secondary | ICD-10-CM | POA: Insufficient documentation

## 2013-05-03 DIAGNOSIS — Z91040 Latex allergy status: Secondary | ICD-10-CM | POA: Insufficient documentation

## 2013-05-03 DIAGNOSIS — M549 Dorsalgia, unspecified: Secondary | ICD-10-CM | POA: Insufficient documentation

## 2013-05-03 DIAGNOSIS — C7889 Secondary malignant neoplasm of other digestive organs: Secondary | ICD-10-CM | POA: Insufficient documentation

## 2013-05-03 DIAGNOSIS — R112 Nausea with vomiting, unspecified: Secondary | ICD-10-CM | POA: Insufficient documentation

## 2013-05-03 DIAGNOSIS — Z862 Personal history of diseases of the blood and blood-forming organs and certain disorders involving the immune mechanism: Secondary | ICD-10-CM | POA: Insufficient documentation

## 2013-05-03 DIAGNOSIS — Z86718 Personal history of other venous thrombosis and embolism: Secondary | ICD-10-CM | POA: Insufficient documentation

## 2013-05-03 DIAGNOSIS — Z452 Encounter for adjustment and management of vascular access device: Secondary | ICD-10-CM

## 2013-05-03 DIAGNOSIS — R1011 Right upper quadrant pain: Secondary | ICD-10-CM | POA: Insufficient documentation

## 2013-05-03 DIAGNOSIS — R109 Unspecified abdominal pain: Secondary | ICD-10-CM

## 2013-05-03 DIAGNOSIS — Z8505 Personal history of malignant neoplasm of liver: Secondary | ICD-10-CM | POA: Insufficient documentation

## 2013-05-03 DIAGNOSIS — C250 Malignant neoplasm of head of pancreas: Secondary | ICD-10-CM

## 2013-05-03 DIAGNOSIS — Z87448 Personal history of other diseases of urinary system: Secondary | ICD-10-CM | POA: Insufficient documentation

## 2013-05-03 DIAGNOSIS — I10 Essential (primary) hypertension: Secondary | ICD-10-CM | POA: Insufficient documentation

## 2013-05-03 DIAGNOSIS — C787 Secondary malignant neoplasm of liver and intrahepatic bile duct: Secondary | ICD-10-CM

## 2013-05-03 DIAGNOSIS — R1033 Periumbilical pain: Secondary | ICD-10-CM | POA: Insufficient documentation

## 2013-05-03 LAB — URINALYSIS, ROUTINE W REFLEX MICROSCOPIC
Bilirubin Urine: NEGATIVE
Glucose, UA: NEGATIVE mg/dL
Hgb urine dipstick: NEGATIVE
Ketones, ur: NEGATIVE mg/dL
LEUKOCYTES UA: NEGATIVE
NITRITE: NEGATIVE
PH: 6 (ref 5.0–8.0)
PROTEIN: NEGATIVE mg/dL
Specific Gravity, Urine: 1.013 (ref 1.005–1.030)
Urobilinogen, UA: 1 mg/dL (ref 0.0–1.0)

## 2013-05-03 LAB — CBC WITH DIFFERENTIAL/PLATELET
Basophils Absolute: 0 10*3/uL (ref 0.0–0.1)
Basophils Relative: 0 % (ref 0–1)
EOS ABS: 0.1 10*3/uL (ref 0.0–0.7)
EOS PCT: 1 % (ref 0–5)
HEMATOCRIT: 32.4 % — AB (ref 36.0–46.0)
HEMOGLOBIN: 10.2 g/dL — AB (ref 12.0–15.0)
Lymphocytes Relative: 11 % — ABNORMAL LOW (ref 12–46)
Lymphs Abs: 0.8 10*3/uL (ref 0.7–4.0)
MCH: 25.5 pg — AB (ref 26.0–34.0)
MCHC: 31.5 g/dL (ref 30.0–36.0)
MCV: 81 fL (ref 78.0–100.0)
MONOS PCT: 3 % (ref 3–12)
Monocytes Absolute: 0.2 10*3/uL (ref 0.1–1.0)
Neutro Abs: 5.9 10*3/uL (ref 1.7–7.7)
Neutrophils Relative %: 85 % — ABNORMAL HIGH (ref 43–77)
Platelets: 132 10*3/uL — ABNORMAL LOW (ref 150–400)
RBC: 4 MIL/uL (ref 3.87–5.11)
RDW: 16.6 % — ABNORMAL HIGH (ref 11.5–15.5)
WBC: 7 10*3/uL (ref 4.0–10.5)

## 2013-05-03 LAB — COMPREHENSIVE METABOLIC PANEL
ALT: 45 U/L — ABNORMAL HIGH (ref 0–35)
AST: 82 U/L — ABNORMAL HIGH (ref 0–37)
Albumin: 3.4 g/dL — ABNORMAL LOW (ref 3.5–5.2)
Alkaline Phosphatase: 147 U/L — ABNORMAL HIGH (ref 39–117)
BILIRUBIN TOTAL: 0.7 mg/dL (ref 0.3–1.2)
BUN: 11 mg/dL (ref 6–23)
CHLORIDE: 99 meq/L (ref 96–112)
CO2: 28 mEq/L (ref 19–32)
Calcium: 9 mg/dL (ref 8.4–10.5)
Creatinine, Ser: 0.6 mg/dL (ref 0.50–1.10)
GFR calc Af Amer: >90 mL/min (ref >90)
GFR calc non Af Amer: 89 mL/min — ABNORMAL LOW (ref >90)
Glucose, Bld: 223 mg/dL — ABNORMAL HIGH (ref 70–99)
Potassium: 3.4 mEq/L — ABNORMAL LOW (ref 3.7–5.3)
Sodium: 140 mEq/L (ref 137–147)
Total Protein: 6.9 g/dL (ref 6.0–8.3)

## 2013-05-03 LAB — LIPASE, BLOOD: Lipase: 22 U/L (ref 11–59)

## 2013-05-03 MED ORDER — HEPARIN SOD (PORK) LOCK FLUSH 100 UNIT/ML IV SOLN
500.0000 [IU] | Freq: Once | INTRAVENOUS | Status: AC | PRN
  Administered 2013-05-03: 500 [IU]
  Filled 2013-05-03: qty 5

## 2013-05-03 MED ORDER — LORAZEPAM 0.5 MG PO TABS: 0.5000 mg | ORAL_TABLET | Freq: Four times a day (QID) | ORAL | Status: DC | PRN

## 2013-05-03 MED ORDER — SODIUM CHLORIDE 0.9 % IV SOLN
1000.0000 mL | INTRAVENOUS | Status: DC
  Administered 2013-05-03: 1000 mL via INTRAVENOUS

## 2013-05-03 MED ORDER — HEPARIN SOD (PORK) LOCK FLUSH 100 UNIT/ML IV SOLN
500.0000 [IU] | Freq: Once | INTRAVENOUS | Status: AC
  Administered 2013-05-03: 500 [IU]
  Filled 2013-05-03: qty 5

## 2013-05-03 MED ORDER — LORAZEPAM 2 MG/ML IJ SOLN
1.0000 mg | Freq: Once | INTRAMUSCULAR | Status: AC
  Administered 2013-05-03: 1 mg via INTRAVENOUS
  Filled 2013-05-03: qty 1

## 2013-05-03 MED ORDER — SODIUM CHLORIDE 0.9 % IV SOLN
1000.0000 mL | Freq: Once | INTRAVENOUS | Status: AC
  Administered 2013-05-03: 1000 mL via INTRAVENOUS

## 2013-05-03 MED ORDER — SODIUM CHLORIDE 0.9 % IJ SOLN
10.0000 mL | INTRAMUSCULAR | Status: DC | PRN
  Administered 2013-05-03: 10 mL
  Filled 2013-05-03: qty 10

## 2013-05-03 MED ORDER — HYDROMORPHONE HCL PF 1 MG/ML IJ SOLN
1.0000 mg | INTRAMUSCULAR | Status: DC | PRN
  Administered 2013-05-03 (×2): 1 mg via INTRAVENOUS
  Filled 2013-05-03 (×2): qty 1

## 2013-05-03 MED ORDER — ONDANSETRON HCL 4 MG/2ML IJ SOLN
4.0000 mg | Freq: Once | INTRAMUSCULAR | Status: AC
  Administered 2013-05-03: 4 mg via INTRAVENOUS
  Filled 2013-05-03: qty 2

## 2013-05-03 NOTE — ED Notes (Signed)
Pt ambulated to [facility] with assistance.

## 2013-05-03 NOTE — ED Provider Notes (Signed)
6:20 PM Patient sitting upright, appears comfortable, states that she feels substantially better. She has tolerated crackers, liquids, popsicle. Remainder of results are discussed with the patient, her daughter, her husband. Patient will be discharged to follow up with her oncology team.   Carmin Muskrat, MD 05/03/13 [phone]

## 2013-05-03 NOTE — ED Notes (Signed)
She states she was dx with pancreatic cancer in Aug. Of 2014.  She has had chemo-radiation-and recent 2-day chemo pump (last Thurs.).  She is here today with c/o intractable nausea with frequent vomiting.  She is also c/o increasing abd. Pain  R/t her not being able to keep down her pain med (dilaudid).  She is quite lucid and in no distress.  Her husband and daughter are with her.

## 2013-05-03 NOTE — Progress Notes (Signed)
Paged Dr. Alen Blew for patient's complaints of "pain and nausea" requesting something for pain now.  Reports she has tried the lorazepam about 5:15 am for nausea.  Unable to tell when she last took the zofran ODT.  Tried liquid dilaudid at 5:00 am and vomited 20 minutes later and has vomited three times so far today.  Drinking [employer]el sports drinks and spoonfuls of applesauce.  Diabetic and blood sugar = 138 at 10:00 am.  Has not taken lantus insulin in two days.  Did not call Dr. Buddy Duty as instructed yesterday.  "Nothing is working.  Pain is nine out of ten on pain scale and I've been hurting for three days now.  Abdomen slightly distended but non-tender to touch and soft.  Reports three soft bm's yesterday but today gets nauseated when I feel the urge to have BM."  Apologized for not being a good historian.  Suggested to she and one of her twin daughters that they journal to keep up with these events.  Daughter says her dad is in denial.  P    Verbal order received and read back from Dr. Alen Blew for patient to go to ED.  This nurse disconnected IV 5FU, flushed Port-a-cath leaving her accessed for ED.  Escorted family to ED via hall way of [facility] at [facility].  Called charge nurse Patty to report previous assessment.    Patient asked if she is to continue these treatments.  Informed her she will discuss therapy and how she tolerated this with provider at next visit.  "If I'm going to hurt like this, I don't know if I can take this"  Appearance and voice are very calm and do not comply with face of pain scale of level 9.

## 2013-05-03 NOTE — ED Notes (Addendum)
Pt given popsicle for po challenge

## 2013-05-03 NOTE — Discharge Instructions (Signed)
As discussed, it is important that you follow up as soon as possible with your physicians for continued management of your condition.  If you develop any new, or concerning changes in your condition, please return to the emergency department immediately.   Abdominal Pain, Adult Many things can cause abdominal pain. Usually, abdominal pain is not caused by a disease and will improve without treatment. It can often be observed and treated at home. Your health care provider will do a physical exam and possibly order blood tests and X-rays to help determine the seriousness of your pain. However, in many cases, more time must pass before a clear cause of the pain can be found. Before that point, your health care provider may not know if you need more testing or further treatment. HOME CARE INSTRUCTIONS  Monitor your abdominal pain for any changes. The following actions may help to alleviate any discomfort you are experiencing:  Only take over-the-counter or prescription medicines as directed by your health care provider.  Do not take laxatives unless directed to do so by your health care provider.  Try a clear liquid diet (broth, tea, or water) as directed by your health care provider. Slowly move to a bland diet as tolerated. SEEK MEDICAL CARE IF:  You have unexplained abdominal pain.  You have abdominal pain associated with nausea or diarrhea.  You have pain when you urinate or have a bowel movement.  You experience abdominal pain that wakes you in the night.  You have abdominal pain that is worsened or improved by eating food.  You have abdominal pain that is worsened with eating fatty foods. SEEK IMMEDIATE MEDICAL CARE IF:   Your pain does not go away within 2 hours.  You have a fever.  You keep throwing up (vomiting).  Your pain is felt only in portions of the abdomen, such as the right side or the left lower portion of the abdomen.  You pass bloody or black tarry stools. MAKE  SURE YOU:  Understand these instructions.   Will watch your condition.   Will get help right away if you are not doing well or get worse.  Document Released: 11/16/2004 Document Revised: 11/27/2012 Document Reviewed: 10/16/2012 [facility] Patient Information 2014 [place].

## 2013-05-03 NOTE — ED Provider Notes (Signed)
CSN: [id number]     Arrival date & time 05/03/13  1322 History   First MD Initiated Contact with Patient 05/03/13 1331     Chief Complaint  Patient presents with  . Abdominal Pain  . Emesis    HPI Comments: Pt has history of pancreatic cancer with liver mets..  Pt is receiving chemo treatments, last treatment was Thursday.  Pt has been having trouble with pain but not this severe.  She has been in touch with her oncologist office and was told to come to the ED.  Patient is a 73 y.o. female presenting with abdominal pain and vomiting. The history is provided by the patient.  Abdominal Pain Pain location:  Epigastric and RUQ Pain quality: sharp   Pain severity:  Severe Onset quality:  Gradual Timing:  Constant Progression:  Worsening Chronicity:  Chronic Relieved by:  Nothing Ineffective treatments: dilaudid and her nausea medications. Associated symptoms: nausea and vomiting   Associated symptoms: no chills, no cough, no dysuria and no fever   Emesis Associated symptoms: abdominal pain   Associated symptoms: no chills     Past Medical History  Diagnosis Date  . Trigger finger   . Hyperlipidemia   . Diabetes mellitus   . Chronic venous insufficiency   . Morbid obesity   . Hypertension   . Thyroid disease   . GERD (gastroesophageal reflux disease)   . Hypothyroidism   . Arthritis   . Anemia   . Common bile duct dilatation 10/01/12  . Fatty liver 10/01/12  . Portal vein thrombosis   . Cholelithiasis   . Pancreatic lesion 10/01/12  . Hydroureteronephrosis     right  . Pancreatic mass 2014  . Cancer 10/17/12     pancreatic   . Allergy   . Anxiety   . Neuromuscular disorder     neuropathy   Past Surgical History  Procedure Laterality Date  . Lumbar laminectomy    . Breast biopsy    . Eus N/A 10/17/2012    Procedure: UPPER ENDOSCOPIC ULTRASOUND (EUS) LINEAR;  Surgeon: Milus Banister, MD;  Location: [facility];  Service: Endoscopy;  Laterality: N/A;  . Portacath  placement Left 11/19/2012    Procedure: INSERTION PORT-A-CATH;  Surgeon: Stark Klein, MD;  Location: [facility];  Service: General;  Laterality: Left;   Family History  Problem Relation Age of Onset  . Heart disease Father   . Arthritis Father   . Breast cancer Mother   . Lymphoma Sister     #1  . Diabetes Sister     #1  . Diabetes Sister     #2  . Diabetes Brother     #1  . Diabetes Brother     #2  . Diabetes Other     siblings  . Hypertension Other     siblings  . Arthritis Other     siblings   History  Substance Use Topics  . Smoking status: Never Smoker   . Smokeless tobacco: Never Used  . Alcohol Use: No   OB History   Grav Para Term Preterm Abortions TAB SAB Ect Mult Living                 Review of Systems  Constitutional: Negative for fever and chills.  Respiratory: Negative for cough.   Gastrointestinal: Positive for nausea, vomiting and abdominal pain.  Genitourinary: Negative for dysuria.  Musculoskeletal: Positive for back pain.  All other systems reviewed and are negative.  Allergies  Oxycodone hcl; Latex; and Zithromax  Home Medications   Current Outpatient Rx  Name  Route  Sig  Dispense  Refill  . acetaminophen (TYLENOL) 325 MG tablet   Oral   Take 650 mg by mouth daily as needed for pain. For pain         . Biotin 5000 MCG CAPS   Oral   Take 5,000 mcg by mouth at bedtime.          Marland Kitchen doxazosin (CARDURA) 8 MG tablet   Oral   Take 8 mg by mouth at bedtime.          Marland Kitchen escitalopram (LEXAPRO) 20 MG tablet   Oral   Take 20 mg by mouth every morning.         . famotidine (PEPCID) 20 MG tablet   Oral   Take 20 mg by mouth at bedtime.          . furosemide (LASIX) 40 MG tablet   Oral   Take 40 mg by mouth every morning.          Marland Kitchen HYDROmorphone HCl (DILAUDID) 1 MG/ML LIQD   Oral   Take 2 mLs (2 mg total) by mouth every 4 (four) hours as needed for severe pain.   150 mL   0   . Insulin Glargine (LANTUS SOLOSTAR) 100  UNIT/ML SOPN   Subcutaneous   Inject 70 Units into the skin every morning.         Marland Kitchen levothyroxine (SYNTHROID, LEVOTHROID) 88 MCG tablet   Oral   Take 88 mcg by mouth daily before breakfast. Name brand only         . LORazepam (ATIVAN) 0.5 MG tablet   Oral   Take 1 tablet (0.5 mg total) by mouth every 8 (eight) hours. As needed for nausea or vomiting.   30 tablet   1   . ondansetron (ZOFRAN-ODT) 8 MG disintegrating tablet   Oral   Take 1 tablet (8 mg total) by mouth every 8 (eight) hours as needed for nausea or vomiting.   20 tablet   1   . polyethylene glycol (MIRALAX / GLYCOLAX) packet   Oral   Take 17 g by mouth daily.         . potassium chloride (KLOR-CON) 20 MEQ packet   Oral   Take 20 mEq by mouth every morning.          . pregabalin (LYRICA) 75 MG capsule   Oral   Take 75-150 mg by mouth 2 (two) times daily. 75mg  in tje morning and 150mg  at bedtime         . prochlorperazine (COMPAZINE) 10 MG tablet   Oral   Take 1 tablet (10 mg total) by mouth every 6 (six) hours as needed (nausea).   60 tablet   1   . senna (SENOKOT) 8.6 MG tablet   Oral   Take 4 tablets by mouth every evening.          . simvastatin (ZOCOR) 20 MG tablet   Oral   Take 20 mg by mouth at bedtime.         . temazepam (RESTORIL) 30 MG capsule   Oral   Take 30 mg by mouth at bedtime as needed for sleep.         . traMADol (ULTRAM) 50 MG tablet   Oral   Take 1 tablet (50 mg total) by mouth every 6 (six) hours as needed.  60 tablet   2     CALLED TO PHARMACY.   Marland Kitchen warfarin (COUMADIN) 5 MG tablet   Oral   Take 5 mg by mouth daily. Hold on Monday then take 5mg  once daily         . lidocaine-prilocaine (EMLA) cream   Topical   Apply 1 application topically as needed. Apply 1 teaspoon to PAC site 1-2 hours prior to stick and cover with plastic wrap to numb site. Do not rub cream in.   30 g   prn    BP 141/55  Pulse 65  Temp(Src) 98.1 F (36.7 C) (Oral)  Resp 11   SpO2 94% Physical Exam  Nursing note and vitals reviewed. Constitutional: She appears well-developed and well-nourished. No distress.  HENT:  Head: Normocephalic and atraumatic.  Right Ear: External ear normal.  Left Ear: External ear normal.  Eyes: Conjunctivae are normal. Right eye exhibits no discharge. Left eye exhibits no discharge. No scleral icterus.  Neck: Neck supple. No tracheal deviation present.  Cardiovascular: Normal rate, regular rhythm and intact distal pulses.   Pulmonary/Chest: Effort normal and breath sounds normal. No stridor. No respiratory distress. She has no wheezes. She has no rales.  Abdominal: Soft. Bowel sounds are normal. She exhibits no distension. There is tenderness in the right upper quadrant and epigastric area. There is no rigidity, no rebound and no guarding. No hernia.  Musculoskeletal: She exhibits no edema and no tenderness.  Neurological: She is alert. She has normal strength. No cranial nerve deficit (no facial droop, extraocular movements intact, no slurred speech) or sensory deficit. She exhibits normal muscle tone. She displays no seizure activity. Coordination normal.  Skin: Skin is warm and dry. No rash noted.  Psychiatric: She has a normal mood and affect.    ED Course  Procedures (including critical care time) Labs Review Labs Reviewed  CBC WITH DIFFERENTIAL - Abnormal; Notable for the following:    Hemoglobin 10.2 (*)    HCT 32.4 (*)    MCH 25.5 (*)    RDW 16.6 (*)    Platelets 132 (*)    Neutrophils Relative % 85 (*)    Lymphocytes Relative 11 (*)    All other components within normal limits  COMPREHENSIVE METABOLIC PANEL - Abnormal; Notable for the following:    Potassium 3.4 (*)    Glucose, Bld 223 (*)    Albumin 3.4 (*)    AST 82 (*)    ALT 45 (*)    Alkaline Phosphatase 147 (*)    GFR calc non Af Amer 89 (*)    All other components within normal limits  LIPASE, BLOOD  URINALYSIS, ROUTINE W REFLEX MICROSCOPIC    Medications  0.9 %  sodium chloride infusion (0 mLs Intravenous Stopped 05/03/13 1505)    Followed by  0.9 %  sodium chloride infusion (1,000 mLs Intravenous New Bag/Given 05/03/13 1402)  HYDROmorphone (DILAUDID) injection 1 mg (1 mg Intravenous Given 05/03/13 1505)  ondansetron (ZOFRAN) injection 4 mg (4 mg Intravenous Given 05/03/13 1402)    MDM  Initial labs are unremarkable.  I suspect that her symptoms are related to her cancer and chemo treatments.  Pt is concerned about going home. We will see if we can get her pain managed and tolerating PO.  If she is unable to do that she may need admission for further treatment.    Kathalene Frames, MD 05/03/13 [phone]

## 2013-05-05 ENCOUNTER — Telehealth: Payer: Self-pay | Admitting: *Deleted

## 2013-05-05 NOTE — Telephone Encounter (Signed)
Pain is her main complaint and rates it at "5". Encouraged her to take her pain medication-2-4 mg every 4 hours as needed. Last emesis was Saturday-still some nausea, but is eating small amounts and getting plenty of fluids. She feels very weak and fatigued, but able to ambulate in home easily. No dyspnea. Asking if there is any other chemo she could try? Told her to discuss this with APP/Dr. Benay Spice at next visit. Just use this time to recover and rest.

## 2013-05-05 NOTE — Telephone Encounter (Signed)
Left VM for patient to call back and ask for triage nurse-re: f/u chemo call.

## 2013-05-05 NOTE — Telephone Encounter (Signed)
Pt's daughter Theresa Summers called (ROI on file) stating that after pt got pump d/c this Saturday 05/03/13; she went to ED d/t severe pain and nausea.  Theresa Summers states "Theresa Summers doesn't want any more treatments and wants to discuss next option with MD asap"  Discussed with Dr. Benay Spice if any sooner appt available prior to 05/14/13 when pt sees Ned Card, NP for family to go over options.  Called and spoke with Theresa Summers informing her that Dr. Benay Spice does not have anything earlier than 3/25 with Lattie Haw and to keep that appt and MD will go over options at that point.  Theresa Summers verbalized understanding and stated she would call her Theresa Summers and tell her.  Confirmed appt 3/25 @ 9:15 lab, 9:45 Ned Card, MP

## 2013-05-07 ENCOUNTER — Encounter: Payer: Self-pay | Admitting: Oncology

## 2013-05-07 NOTE — Progress Notes (Signed)
Put disability paper on nurse's desk.

## 2013-05-08 ENCOUNTER — Other Ambulatory Visit (HOSPITAL_BASED_OUTPATIENT_CLINIC_OR_DEPARTMENT_OTHER): Payer: 59

## 2013-05-08 ENCOUNTER — Other Ambulatory Visit: Payer: Self-pay | Admitting: Nurse Practitioner

## 2013-05-08 ENCOUNTER — Ambulatory Visit (HOSPITAL_BASED_OUTPATIENT_CLINIC_OR_DEPARTMENT_OTHER): Payer: 59 | Admitting: Pharmacist

## 2013-05-08 DIAGNOSIS — I81 Portal vein thrombosis: Secondary | ICD-10-CM

## 2013-05-08 DIAGNOSIS — C259 Malignant neoplasm of pancreas, unspecified: Secondary | ICD-10-CM

## 2013-05-08 DIAGNOSIS — N1330 Unspecified hydronephrosis: Secondary | ICD-10-CM

## 2013-05-08 LAB — PROTIME-INR
INR: 2.2 (ref 2.00–3.50)
Protime: 26.4 Seconds — ABNORMAL HIGH (ref 10.6–13.4)

## 2013-05-08 MED ORDER — HYDROMORPHONE HCL 1 MG/ML PO LIQD: 2.0000 mg | ORAL | Status: DC | PRN

## 2013-05-08 NOTE — Progress Notes (Signed)
INR = 2.2   Goal 2-3 INR within goal range. No complicatons of anticoagulation noted. Patient seen in ED over weekend for pain and nausea, received RX for Zofran ODT which is helping. New RX for hydromorphone liquid last week which is helping pain, Ned Card providing new RX for this today to get patient through the weekend. No dietary changes, patient states she has been unable to eat very well for quite a while. Patient has appts at [facility] next week. Continue Coumadin 5 mg daily and 7.5 mg on Tuesday, Thursdays, and Saturdays. Recheck INR next week on 05/14/13 with 9:15am for lab, 9:30am for coumadin clinic, and 9:45 for Ned Card.  Theone Murdoch, PharmD

## 2013-05-09 ENCOUNTER — Encounter: Payer: Self-pay | Admitting: Oncology

## 2013-05-11 ENCOUNTER — Inpatient Hospital Stay (HOSPITAL_COMMUNITY): Payer: 59

## 2013-05-11 ENCOUNTER — Encounter (HOSPITAL_COMMUNITY): Payer: Self-pay | Admitting: Emergency Medicine

## 2013-05-11 ENCOUNTER — Inpatient Hospital Stay (HOSPITAL_COMMUNITY)
Admission: EM | Admit: 2013-05-11 | Discharge: 2013-05-13 | DRG: 193 | Disposition: A | Discharge status: Hospice | Payer: 59 | Attending: Internal Medicine | Admitting: Internal Medicine

## 2013-05-11 ENCOUNTER — Emergency Department (HOSPITAL_COMMUNITY): Payer: 59

## 2013-05-11 DIAGNOSIS — I81 Portal vein thrombosis: Secondary | ICD-10-CM

## 2013-05-11 DIAGNOSIS — D630 Anemia in neoplastic disease: Secondary | ICD-10-CM | POA: Diagnosis present

## 2013-05-11 DIAGNOSIS — D638 Anemia in other chronic diseases classified elsewhere: Secondary | ICD-10-CM

## 2013-05-11 DIAGNOSIS — R5383 Other fatigue: Secondary | ICD-10-CM

## 2013-05-11 DIAGNOSIS — D709 Neutropenia, unspecified: Secondary | ICD-10-CM

## 2013-05-11 DIAGNOSIS — Z833 Family history of diabetes mellitus: Secondary | ICD-10-CM

## 2013-05-11 DIAGNOSIS — I872 Venous insufficiency (chronic) (peripheral): Secondary | ICD-10-CM | POA: Diagnosis present

## 2013-05-11 DIAGNOSIS — L03115 Cellulitis of right lower limb: Secondary | ICD-10-CM

## 2013-05-11 DIAGNOSIS — Z803 Family history of malignant neoplasm of breast: Secondary | ICD-10-CM

## 2013-05-11 DIAGNOSIS — R5381 Other malaise: Secondary | ICD-10-CM

## 2013-05-11 DIAGNOSIS — K219 Gastro-esophageal reflux disease without esophagitis: Secondary | ICD-10-CM | POA: Diagnosis present

## 2013-05-11 DIAGNOSIS — R180 Malignant ascites: Secondary | ICD-10-CM

## 2013-05-11 DIAGNOSIS — C250 Malignant neoplasm of head of pancreas: Secondary | ICD-10-CM

## 2013-05-11 DIAGNOSIS — Z807 Family history of other malignant neoplasms of lymphoid, hematopoietic and related tissues: Secondary | ICD-10-CM

## 2013-05-11 DIAGNOSIS — C259 Malignant neoplasm of pancreas, unspecified: Secondary | ICD-10-CM

## 2013-05-11 DIAGNOSIS — Z9221 Personal history of antineoplastic chemotherapy: Secondary | ICD-10-CM

## 2013-05-11 DIAGNOSIS — Z7901 Long term (current) use of anticoagulants: Secondary | ICD-10-CM

## 2013-05-11 DIAGNOSIS — R509 Fever, unspecified: Secondary | ICD-10-CM

## 2013-05-11 DIAGNOSIS — E1142 Type 2 diabetes mellitus with diabetic polyneuropathy: Secondary | ICD-10-CM | POA: Diagnosis present

## 2013-05-11 DIAGNOSIS — Z66 Do not resuscitate: Secondary | ICD-10-CM | POA: Diagnosis present

## 2013-05-11 DIAGNOSIS — Z8249 Family history of ischemic heart disease and other diseases of the circulatory system: Secondary | ICD-10-CM

## 2013-05-11 DIAGNOSIS — I10 Essential (primary) hypertension: Secondary | ICD-10-CM | POA: Diagnosis present

## 2013-05-11 DIAGNOSIS — K5900 Constipation, unspecified: Secondary | ICD-10-CM

## 2013-05-11 DIAGNOSIS — D6959 Other secondary thrombocytopenia: Secondary | ICD-10-CM | POA: Diagnosis present

## 2013-05-11 DIAGNOSIS — G893 Neoplasm related pain (acute) (chronic): Secondary | ICD-10-CM | POA: Diagnosis present

## 2013-05-11 DIAGNOSIS — N1330 Unspecified hydronephrosis: Secondary | ICD-10-CM

## 2013-05-11 DIAGNOSIS — E039 Hypothyroidism, unspecified: Secondary | ICD-10-CM | POA: Diagnosis present

## 2013-05-11 DIAGNOSIS — C787 Secondary malignant neoplasm of liver and intrahepatic bile duct: Secondary | ICD-10-CM | POA: Diagnosis present

## 2013-05-11 DIAGNOSIS — E119 Type 2 diabetes mellitus without complications: Secondary | ICD-10-CM

## 2013-05-11 DIAGNOSIS — E785 Hyperlipidemia, unspecified: Secondary | ICD-10-CM | POA: Diagnosis present

## 2013-05-11 DIAGNOSIS — J189 Pneumonia, unspecified organism: Principal | ICD-10-CM

## 2013-05-11 DIAGNOSIS — Z6834 Body mass index (BMI) 34.0-34.9, adult: Secondary | ICD-10-CM

## 2013-05-11 DIAGNOSIS — Z79899 Other long term (current) drug therapy: Secondary | ICD-10-CM

## 2013-05-11 DIAGNOSIS — E1149 Type 2 diabetes mellitus with other diabetic neurological complication: Secondary | ICD-10-CM

## 2013-05-11 DIAGNOSIS — Z87442 Personal history of urinary calculi: Secondary | ICD-10-CM

## 2013-05-11 DIAGNOSIS — Z794 Long term (current) use of insulin: Secondary | ICD-10-CM

## 2013-05-11 LAB — URINALYSIS, ROUTINE W REFLEX MICROSCOPIC
GLUCOSE, UA: NEGATIVE mg/dL
Hgb urine dipstick: NEGATIVE
KETONES UR: NEGATIVE mg/dL
Nitrite: NEGATIVE
PH: 5.5 (ref 5.0–8.0)
Protein, ur: 30 mg/dL — AB
Specific Gravity, Urine: 1.025 (ref 1.005–1.030)
Urobilinogen, UA: 1 mg/dL (ref 0.0–1.0)

## 2013-05-11 LAB — I-STAT CG4 LACTIC ACID, ED: Lactic Acid, Venous: 0.95 mmol/L (ref 0.5–2.2)

## 2013-05-11 LAB — COMPREHENSIVE METABOLIC PANEL
ALK PHOS: 171 U/L — AB (ref 39–117)
ALT: 32 U/L (ref 0–35)
AST: 50 U/L — ABNORMAL HIGH (ref 0–37)
Albumin: 3.2 g/dL — ABNORMAL LOW (ref 3.5–5.2)
BUN: 10 mg/dL (ref 6–23)
CALCIUM: 8.9 mg/dL (ref 8.4–10.5)
CO2: 27 mEq/L (ref 19–32)
Chloride: 94 mEq/L — ABNORMAL LOW (ref 96–112)
Creatinine, Ser: 0.88 mg/dL (ref 0.50–1.10)
GFR calc non Af Amer: 64 mL/min — ABNORMAL LOW (ref >90)
GFR, EST AFRICAN AMERICAN: 74 mL/min — AB (ref >90)
GLUCOSE: 124 mg/dL — AB (ref 70–99)
Potassium: 4.1 mEq/L (ref 3.7–5.3)
Sodium: 133 mEq/L — ABNORMAL LOW (ref 137–147)
Total Bilirubin: 0.7 mg/dL (ref 0.3–1.2)
Total Protein: 6.4 g/dL (ref 6.0–8.3)

## 2013-05-11 LAB — URINE MICROSCOPIC-ADD ON

## 2013-05-11 LAB — CBC
HCT: 31.5 % — ABNORMAL LOW (ref 36.0–46.0)
Hemoglobin: 9.9 g/dL — ABNORMAL LOW (ref 12.0–15.0)
MCH: 25.2 pg — AB (ref 26.0–34.0)
MCHC: 31.4 g/dL (ref 30.0–36.0)
MCV: 80.2 fL (ref 78.0–100.0)
Platelets: 108 10*3/uL — ABNORMAL LOW (ref 150–400)
RBC: 3.93 MIL/uL (ref 3.87–5.11)
RDW: 17.1 % — ABNORMAL HIGH (ref 11.5–15.5)
WBC: 7.6 10*3/uL (ref 4.0–10.5)

## 2013-05-11 LAB — I-STAT CHEM 8, ED
BUN: 10 mg/dL (ref 6–23)
CREATININE: 1 mg/dL (ref 0.50–1.10)
Calcium, Ion: 1.15 mmol/L (ref 1.13–1.30)
Chloride: 96 mEq/L (ref 96–112)
Glucose, Bld: 123 mg/dL — ABNORMAL HIGH (ref 70–99)
HCT: 33 % — ABNORMAL LOW (ref 36.0–46.0)
Hemoglobin: 11.2 g/dL — ABNORMAL LOW (ref 12.0–15.0)
Potassium: 4.1 mEq/L (ref 3.7–5.3)
Sodium: 137 mEq/L (ref 137–147)
TCO2: 29 mmol/L (ref 0–100)

## 2013-05-11 LAB — GLUCOSE, CAPILLARY
GLUCOSE-CAPILLARY: 146 mg/dL — AB (ref 70–99)
Glucose-Capillary: 128 mg/dL — ABNORMAL HIGH (ref 70–99)
Glucose-Capillary: 172 mg/dL — ABNORMAL HIGH (ref 70–99)

## 2013-05-11 LAB — PROTIME-INR
INR: 2.21 — AB (ref 0.00–1.49)
Prothrombin Time: 23.8 seconds — ABNORMAL HIGH (ref 11.6–15.2)

## 2013-05-11 LAB — CBG MONITORING, ED: Glucose-Capillary: 128 mg/dL — ABNORMAL HIGH (ref 70–99)

## 2013-05-11 MED ORDER — LIDOCAINE-PRILOCAINE 2.5-2.5 % EX CREA
TOPICAL_CREAM | CUTANEOUS | Status: DC | PRN
  Filled 2013-05-11: qty 5

## 2013-05-11 MED ORDER — INSULIN ASPART 100 UNIT/ML ~~LOC~~ SOLN
0.0000 [IU] | SUBCUTANEOUS | Status: DC
  Administered 2013-05-11 (×2): 2 [IU] via SUBCUTANEOUS
  Administered 2013-05-11 – 2013-05-12 (×2): 3 [IU] via SUBCUTANEOUS
  Administered 2013-05-12 – 2013-05-13 (×3): 2 [IU] via SUBCUTANEOUS

## 2013-05-11 MED ORDER — SODIUM CHLORIDE 0.9 % IJ SOLN
10.0000 mL | INTRAMUSCULAR | Status: DC | PRN
  Administered 2013-05-12 – 2013-05-13 (×3): 10 mL

## 2013-05-11 MED ORDER — HYDROMORPHONE HCL PF 1 MG/ML IJ SOLN
1.0000 mg | INTRAMUSCULAR | Status: DC | PRN
  Administered 2013-05-11 – 2013-05-12 (×7): 1 mg via INTRAVENOUS
  Filled 2013-05-11 (×8): qty 1

## 2013-05-11 MED ORDER — TEMAZEPAM 15 MG PO CAPS: 30.0000 mg | ORAL_CAPSULE | Freq: Every evening | ORAL | Status: DC | PRN

## 2013-05-11 MED ORDER — PREGABALIN 75 MG PO CAPS
150.0000 mg | ORAL_CAPSULE | Freq: Every day | ORAL | Status: DC
  Administered 2013-05-11 – 2013-05-12 (×2): 150 mg via ORAL
  Filled 2013-05-11 (×2): qty 2

## 2013-05-11 MED ORDER — POLYETHYLENE GLYCOL 3350 17 G PO PACK
17.0000 g | PACK | Freq: Two times a day (BID) | ORAL | Status: DC
  Administered 2013-05-11 – 2013-05-13 (×5): 17 g via ORAL
  Filled 2013-05-11 (×6): qty 1

## 2013-05-11 MED ORDER — FAMOTIDINE 20 MG PO TABS
20.0000 mg | ORAL_TABLET | Freq: Every day | ORAL | Status: DC
  Administered 2013-05-11 – 2013-05-12 (×2): 20 mg via ORAL
  Filled 2013-05-11 (×3): qty 1

## 2013-05-11 MED ORDER — WARFARIN - PHARMACIST DOSING INPATIENT
Freq: Every day | Status: DC
  Administered 2013-05-12: 18:00:00

## 2013-05-11 MED ORDER — PREGABALIN 75 MG PO CAPS
75.0000 mg | ORAL_CAPSULE | Freq: Every day | ORAL | Status: DC
  Administered 2013-05-11 – 2013-05-13 (×3): 75 mg via ORAL
  Filled 2013-05-11 (×3): qty 1

## 2013-05-11 MED ORDER — ZOLPIDEM TARTRATE 5 MG PO TABS: 5.0000 mg | ORAL_TABLET | Freq: Every evening | ORAL | Status: DC | PRN

## 2013-05-11 MED ORDER — ACETAMINOPHEN 325 MG PO TABS: 650.0000 mg | ORAL_TABLET | Freq: Every day | ORAL | Status: DC | PRN

## 2013-05-11 MED ORDER — SODIUM CHLORIDE 0.9 % IV SOLN
1500.0000 mg | INTRAVENOUS | Status: DC
  Administered 2013-05-12 – 2013-05-13 (×2): 1500 mg via INTRAVENOUS
  Filled 2013-05-11 (×2): qty 1500

## 2013-05-11 MED ORDER — IOHEXOL 300 MG/ML  SOLN
100.0000 mL | Freq: Once | INTRAMUSCULAR | Status: AC | PRN
  Administered 2013-05-11: 100 mL via INTRAVENOUS

## 2013-05-11 MED ORDER — LEVOFLOXACIN IN D5W 750 MG/150ML IV SOLN
750.0000 mg | Freq: Once | INTRAVENOUS | Status: AC
  Administered 2013-05-11: 750 mg via INTRAVENOUS
  Filled 2013-05-11: qty 150

## 2013-05-11 MED ORDER — FUROSEMIDE 40 MG PO TABS
40.0000 mg | ORAL_TABLET | Freq: Every morning | ORAL | Status: DC
  Administered 2013-05-11 – 2013-05-13 (×3): 40 mg via ORAL
  Filled 2013-05-11 (×3): qty 1

## 2013-05-11 MED ORDER — VANCOMYCIN HCL 10 G IV SOLR
2000.0000 mg | INTRAVENOUS | Status: AC
  Administered 2013-05-11: 2000 mg via INTRAVENOUS
  Filled 2013-05-11: qty 2000

## 2013-05-11 MED ORDER — POTASSIUM CHLORIDE 20 MEQ PO PACK
20.0000 meq | PACK | Freq: Every morning | ORAL | Status: DC
  Filled 2013-05-11: qty 1

## 2013-05-11 MED ORDER — SENNA 8.6 MG PO TABS
2.0000 | ORAL_TABLET | Freq: Every day | ORAL | Status: DC
  Administered 2013-05-11 – 2013-05-12 (×2): 17.2 mg via ORAL
  Filled 2013-05-11 (×2): qty 2

## 2013-05-11 MED ORDER — LIDOCAINE-PRILOCAINE 2.5-2.5 % EX CREA
1.0000 "application " | TOPICAL_CREAM | CUTANEOUS | Status: DC | PRN
  Filled 2013-05-11: qty 5

## 2013-05-11 MED ORDER — POTASSIUM CHLORIDE CRYS ER 20 MEQ PO TBCR
20.0000 meq | EXTENDED_RELEASE_TABLET | Freq: Every day | ORAL | Status: DC
  Administered 2013-05-11: 20 meq via ORAL
  Filled 2013-05-11: qty 1

## 2013-05-11 MED ORDER — LORAZEPAM 0.5 MG PO TABS
0.5000 mg | ORAL_TABLET | Freq: Four times a day (QID) | ORAL | Status: DC | PRN
  Administered 2013-05-13: 0.5 mg via ORAL
  Filled 2013-05-11 (×2): qty 1

## 2013-05-11 MED ORDER — POLYETHYLENE GLYCOL 3350 17 G PO PACK
17.0000 g | PACK | Freq: Every day | ORAL | Status: DC
  Filled 2013-05-11: qty 1

## 2013-05-11 MED ORDER — WARFARIN SODIUM 4 MG PO TABS
4.0000 mg | ORAL_TABLET | Freq: Once | ORAL | Status: AC
  Administered 2013-05-11: 4 mg via ORAL
  Filled 2013-05-11: qty 1

## 2013-05-11 MED ORDER — POTASSIUM CHLORIDE 20 MEQ/15ML (10%) PO LIQD
20.0000 meq | Freq: Every day | ORAL | Status: DC
  Administered 2013-05-12 – 2013-05-13 (×2): 20 meq via ORAL
  Filled 2013-05-11 (×2): qty 15

## 2013-05-11 MED ORDER — ACETAMINOPHEN 325 MG PO TABS
650.0000 mg | ORAL_TABLET | Freq: Once | ORAL | Status: AC
  Administered 2013-05-11: 650 mg via ORAL
  Filled 2013-05-11: qty 2

## 2013-05-11 MED ORDER — INSULIN ASPART 100 UNIT/ML ~~LOC~~ SOLN: 0.0000 [IU] | Freq: Three times a day (TID) | SUBCUTANEOUS | Status: DC

## 2013-05-11 MED ORDER — SIMVASTATIN 20 MG PO TABS
20.0000 mg | ORAL_TABLET | Freq: Every day | ORAL | Status: DC
  Administered 2013-05-11 – 2013-05-12 (×2): 20 mg via ORAL
  Filled 2013-05-11 (×3): qty 1

## 2013-05-11 MED ORDER — INSULIN GLARGINE 100 UNIT/ML ~~LOC~~ SOLN
40.0000 [IU] | Freq: Every day | SUBCUTANEOUS | Status: DC
  Administered 2013-05-11 – 2013-05-13 (×3): 40 [IU] via SUBCUTANEOUS
  Filled 2013-05-11 (×3): qty 0.4

## 2013-05-11 MED ORDER — ONDANSETRON HCL 4 MG/2ML IJ SOLN
4.0000 mg | Freq: Four times a day (QID) | INTRAMUSCULAR | Status: DC | PRN
  Administered 2013-05-11 – 2013-05-12 (×4): 4 mg via INTRAVENOUS
  Filled 2013-05-11 (×4): qty 2

## 2013-05-11 MED ORDER — LEVOFLOXACIN IN D5W 750 MG/150ML IV SOLN
750.0000 mg | INTRAVENOUS | Status: DC
  Administered 2013-05-12 – 2013-05-13 (×2): 750 mg via INTRAVENOUS
  Filled 2013-05-11 (×2): qty 150

## 2013-05-11 MED ORDER — SODIUM CHLORIDE 0.9 % IJ SOLN: 10.0000 mL | Freq: Two times a day (BID) | INTRAMUSCULAR | Status: DC

## 2013-05-11 MED ORDER — ESCITALOPRAM OXALATE 20 MG PO TABS
20.0000 mg | ORAL_TABLET | Freq: Every morning | ORAL | Status: DC
  Administered 2013-05-11 – 2013-05-13 (×3): 20 mg via ORAL
  Filled 2013-05-11 (×3): qty 1

## 2013-05-11 MED ORDER — LEVOTHYROXINE SODIUM 88 MCG PO TABS
88.0000 ug | ORAL_TABLET | Freq: Every day | ORAL | Status: DC
  Administered 2013-05-11 – 2013-05-13 (×3): 88 ug via ORAL
  Filled 2013-05-11 (×4): qty 1

## 2013-05-11 MED ORDER — DOXAZOSIN MESYLATE 8 MG PO TABS
8.0000 mg | ORAL_TABLET | Freq: Every day | ORAL | Status: DC
  Administered 2013-05-11 – 2013-05-12 (×2): 8 mg via ORAL
  Filled 2013-05-11 (×3): qty 1

## 2013-05-11 NOTE — H&P (Addendum)
[facility] History and Physical  Theresa Summers HTD:[id number] DOB: 30-Mar-1940 DOA: 05/11/2013  Referring physician: EDP PCP: Arnette Norris, MD   Chief Complaint: Fever   HPI: Theresa Summers is a 73 y.o. female history of stage 4 pancreatic cancer, lives at home with husband.  Tonight patient developed fever and AMS.  She also has cough ongoing throughout the day today which is new per her husband.  She initially had AMS at presentation to ED believed to be related to taking too much narcotic medication (3mg  PO dilaudid which is newly increased from 2mg  as of Friday).  She also has abdominal pain and fullness, severe abdominal distention due to ascites.  Review of Systems: Systems reviewed.  As above, otherwise negative  Past Medical History  Diagnosis Date  . Trigger finger   . Hyperlipidemia   . Diabetes mellitus   . Chronic venous insufficiency   . Morbid obesity   . Hypertension   . Thyroid disease   . GERD (gastroesophageal reflux disease)   . Hypothyroidism   . Arthritis   . Anemia   . Common bile duct dilatation 10/01/12  . Fatty liver 10/01/12  . Portal vein thrombosis   . Cholelithiasis   . Pancreatic lesion 10/01/12  . Hydroureteronephrosis     right  . Pancreatic mass 2014  . Cancer 10/17/12     pancreatic   . Allergy   . Anxiety   . Neuromuscular disorder     neuropathy   Past Surgical History  Procedure Laterality Date  . Lumbar laminectomy    . Breast biopsy    . Eus N/A 10/17/2012    Procedure: UPPER ENDOSCOPIC ULTRASOUND (EUS) LINEAR;  Surgeon: Milus Banister, MD;  Location: [facility];  Service: Endoscopy;  Laterality: N/A;  . Portacath placement Left 11/19/2012    Procedure: INSERTION PORT-A-CATH;  Surgeon: Stark Klein, MD;  Location: [facility];  Service: General;  Laterality: Left;   Social History:  reports that she has never smoked. She has never used smokeless tobacco. She reports that she does not drink alcohol or use illicit drugs.  Allergies   Allergen Reactions  . Oxycodone Hcl Nausea Only  . Latex Rash  . Zithromax [Azithromycin] Other (See Comments)    thrush    Family History  Problem Relation Age of Onset  . Heart disease Father   . Arthritis Father   . Breast cancer Mother   . Lymphoma Sister     #1  . Diabetes Sister     #1  . Diabetes Sister     #2  . Diabetes Brother     #1  . Diabetes Brother     #2  . Diabetes Other     siblings  . Hypertension Other     siblings  . Arthritis Other     siblings     Prior to Admission medications   Medication Sig Start Date End Date Taking? Authorizing Provider  acetaminophen (TYLENOL) 325 MG tablet Take 650 mg by mouth daily as needed for pain. For pain   Yes Historical Provider, MD  Biotin 5000 MCG CAPS Take 5,000 mcg by mouth at bedtime.    Yes Historical Provider, MD  doxazosin (CARDURA) 8 MG tablet Take 8 mg by mouth at bedtime.    Yes Historical Provider, MD  escitalopram (LEXAPRO) 20 MG tablet Take 20 mg by mouth every morning.   Yes Historical Provider, MD  famotidine (PEPCID) 20 MG tablet Take 20 mg by  mouth at bedtime.    Yes Historical Provider, MD  furosemide (LASIX) 40 MG tablet Take 40 mg by mouth every morning.    Yes Historical Provider, MD  HYDROmorphone HCl (DILAUDID) 1 MG/ML LIQD Take 2 mLs (2 mg total) by mouth every 4 (four) hours as needed for severe pain. 05/08/13  Yes Owens Shark, NP  Insulin Glargine (LANTUS SOLOSTAR) 100 UNIT/ML SOPN Inject 70 Units into the skin every morning.   Yes Historical Provider, MD  levothyroxine (SYNTHROID, LEVOTHROID) 88 MCG tablet Take 88 mcg by mouth daily before breakfast. Name brand only   Yes Historical Provider, MD  lidocaine-prilocaine (EMLA) cream Apply 1 application topically as needed (topical numbing).   Yes Historical Provider, MD  LORazepam (ATIVAN) 0.5 MG tablet Take 0.5 mg by mouth every 6 (six) hours as needed for anxiety (or nausea). As needed for nausea or vomiting. 05/03/13  Yes Carmin Muskrat,  MD  ondansetron (ZOFRAN-ODT) 8 MG disintegrating tablet Take 1 tablet (8 mg total) by mouth every 8 (eight) hours as needed for nausea or vomiting. 05/02/13  Yes Owens Shark, NP  polyethylene glycol (MIRALAX / GLYCOLAX) packet Take 17 g by mouth daily.   Yes Historical Provider, MD  potassium chloride (KLOR-CON) 20 MEQ packet Take 20 mEq by mouth every morning.    Yes Historical Provider, MD  pregabalin (LYRICA) 75 MG capsule Take 75-150 mg by mouth 2 (two) times daily. 75mg  in the morning and 150mg  at bedtime   Yes Historical Provider, MD  simvastatin (ZOCOR) 20 MG tablet Take 20 mg by mouth at bedtime.   Yes Historical Provider, MD  temazepam (RESTORIL) 30 MG capsule Take 30 mg by mouth at bedtime as needed for sleep.   Yes Historical Provider, MD  traMADol (ULTRAM) 50 MG tablet Take 50 mg by mouth every 6 (six) hours as needed for moderate pain. 03/31/13  Yes Ladell Pier, MD  warfarin (COUMADIN) 5 MG tablet Take 5-7.5 mg by mouth every evening. Take 7.5mg  every day except take 5mg  Monday, Wednesday, and Friday.   Yes Historical Provider, MD   Physical Exam: Filed Vitals:   05/11/13 0545  BP: 142/50  Pulse: 92  Temp:   Resp: 14    BP 142/50  Pulse 92  Temp(Src) 103.7 F (39.8 C) (Rectal)  Resp 14  SpO2 96%  General Appearance:    Alert, oriented, no distress, appears stated age  Head:    Normocephalic, atraumatic  Eyes:    PERRL, EOMI, sclera non-icteric        Nose:   Nares without drainage or epistaxis. Mucosa, turbinates normal  Throat:   Moist mucous membranes. Oropharynx without erythema or exudate.  Neck:   Supple. No carotid bruits.  No thyromegaly.  No lymphadenopathy.   Back:     No CVA tenderness, no spinal tenderness  Lungs:     Absent at bases  Chest wall:    No tenderness to palpitation  Heart:    Regular rate and rhythm without murmurs, gallops, rubs  Abdomen:     Distended with ascites, diffusely tender, but no peritoneal signs.  Genitalia:    deferred   Rectal:    deferred  Extremities:   No clubbing, cyanosis or edema.  Pulses:   2+ and symmetric all extremities  Skin:   Skin color, texture, turgor normal, no rashes or lesions  Lymph nodes:   Cervical, supraclavicular, and axillary nodes normal  Neurologic:   CNII-XII intact. Normal strength, sensation and  reflexes      throughout    Labs on Admission:  Basic Metabolic Panel:  Recent Labs Lab 05/11/13 0335 05/11/13 0349  NA 133* 137  K 4.1 4.1  CL 94* 96  CO2 27  --   GLUCOSE 124* 123*  BUN 10 10  CREATININE 0.88 1.00  CALCIUM 8.9  --    Liver Function Tests:  Recent Labs Lab 05/11/13 0335  AST 50*  ALT 32  ALKPHOS 171*  BILITOT 0.7  PROT 6.4  ALBUMIN 3.2*   No results found for this basename: LIPASE, AMYLASE,  in the last 168 hours No results found for this basename: AMMONIA,  in the last 168 hours CBC:  Recent Labs Lab 05/11/13 0335 05/11/13 0349  WBC 7.6  --   HGB 9.9* 11.2*  HCT 31.5* 33.0*  MCV 80.2  --   PLT 108*  --    Cardiac Enzymes: No results found for this basename: CKTOTAL, CKMB, CKMBINDEX, TROPONINI,  in the last 168 hours  BNP (last 3 results)  Recent Labs  01/14/13 1809  PROBNP 419.5*   CBG: No results found for this basename: GLUCAP,  in the last 168 hours  Radiological Exams on Admission: Dg Chest Portable 1 View  05/11/2013   CLINICAL DATA:  Weakness, cough, shortness of breath  EXAM: PORTABLE CHEST - 1 VIEW  COMPARISON:  Prior radiograph from 01/14/2013  FINDINGS: Cardiomegaly is stable as compared to prior study. Left-sided Port-A-Cath noted.  Lungs are hypoinflated. There is diffuse pulmonary vascular congestion without overt pulmonary edema. Bibasilar patchy opacities may reflect atelectasis or possibly infiltrates. No definite pleural effusion. No pneumothorax.  No acute osseus abnormality. Severe degenerative changes noted about the [facility] joints bilaterally.  IMPRESSION: 1. Stable cardiomegaly with mild diffuse pulmonary  vascular congestion without overt pulmonary edema. 2. Shallow lung inflation with patchy bibasilar opacities, likely atelectasis. Possible infiltrate within the left lower lobe could be considered in the correct clinical setting.   Electronically Signed   By: Jeannine Boga M.D.   On: 05/11/2013 05:30    EKG: Independently reviewed.  Assessment/Plan Principal Problem:   Fever Active Problems:   Portal vein thrombosis   Malignant neoplasm of head of pancreas   Ascites, malignant   DM2 (diabetes mellitus, type 2)   1. Fever - empiric treatment with levaquin at this point, most likely CAP as source.  Getting CT chest to confirm this, as CXR was more impressive for pleural effusion (likely ascites from her abdomen).  CT chest would also be useful to evaluate for loculated effusion.  An alternative source could be the patients abdomen, no peritoneal signs on exam however, none the less plan on paracentesis and culture to further evaluate.  Blood culture also ordered and pending.  UA negative. 2. Portal vein thrombosis - I spoke with IR, they said that we could leave patient on her coumadin and they will still be able to do the paracentesis, apparently her INR of 2.2 is acceptable to do the procedure with as it is a very low risk of bleeding complications. 3. DM2 - holding her home lantus 70, and putting patient instead on lantus 40 with moderate dose Q4H as she is NPO for procedure.   Code Status: Full code at the moment, family already scheduled to have discussion with hospice about life care and possibly changing code status during meeting on Wed, it does sound as though husband and daughter are very open to these discussions, they have indicated that  their goals of care at this point are more about patient being comfortable. Family Communication: Spoke with daughter and husband at bedside Disposition Plan: Admit to inpatient.   Time spent: 70 min  Tory Septer M. [facility] Pager [phone]  If 7AM-7PM, please contact the day team taking care of the patient [email or web address] Password [facility] 05/11/2013, 6:06 AM

## 2013-05-11 NOTE — ED Notes (Signed)
Pt can answer most cognition questions appropriately however she is slow to respond.  Pt cannot verbalize why she is at the hospital.   Attempting to obtain a urine specimen at this time.

## 2013-05-11 NOTE — Progress Notes (Signed)
PATIENT DETAILS Name: Theresa Summers Age: 73 y.o. Sex: female Date of Birth: 1940-05-13 Admit Date: 05/11/2013 Admitting Physician Etta Quill, DO JS:[id number] Deborra Medina, MD  Subjective: No major issues, continues to have chronic abdominal pain. Mental status much improved, awake and alert.  Assessment/Plan: Principal Problem:  Pneumonia - Improved, afebrile since admission, no leukocytosis however immunocompromised - Continue with vancomycin and Levaquin-day 1 - Await blood cultures  Active Problems: Ascites - Likely malignant- especially given history of pancreatic cancer - See no urgent need for paracentesis at this time. Suspect pneumonia is likely the cause of fever.  Chronic abdominal pain - Secondary to stage IV pancreatic cancer - Continue with IV narcotics, bowel regimen. - We'll check a chest x-ray to see if she has a significant stool burden.    Portal vein thrombosis - On Coumadin, we'll have pharmacy dose. 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 - Patient claims that she no longer is going to pursue chemotherapy - Will touch base with patient's primary oncologist on 3/23 - Had a long discussion with patient and daughter at bedside regarding end-of-life issues, the family will discuss and let us know about CODE STATUS.  Diabetes - CBGs controlled, continue with Lantus and SSI  Hypothyroidism - Continue levothyroxine  Hypertension - Continue with Lasix and Cardura  Disposition: Remain inpatient  DVT Prophylaxis: Not needed as on Coumadin  Code Status: Full code  Family Communication Daughter at bedside  Procedures:  None  CONSULTS:  None  Time spent 40 minutes-which includes 50% of the time with face-to-face with patient/ family and coordinating care related to the above assessment and  plan.    MEDICATIONS: Scheduled Meds: . doxazosin  8 mg Oral QHS  . escitalopram  20 mg Oral q morning - 10a  . famotidine  20 mg Oral QHS  . furosemide  40 mg Oral q morning - 10a  . insulin aspart  0-15 Units Subcutaneous 6 times per day  . insulin glargine  40 Units Subcutaneous Daily  . [START ON 05/12/2013] levofloxacin (LEVAQUIN) IV  750 mg Intravenous Q24H  . levothyroxine  88 mcg Oral QAC breakfast  . polyethylene glycol  17 g Oral BID  . [START ON 05/12/2013] potassium chloride  20 mEq Oral Daily  . pregabalin  150 mg Oral QHS  . pregabalin  75 mg Oral Daily  . senna  2 tablet Oral QHS  . simvastatin  20 mg Oral QHS  . [START ON 05/12/2013] vancomycin  1,500 mg Intravenous Q24H  . warfarin  4 mg Oral ONCE-1800  . Warfarin - Pharmacist Dosing Inpatient   Does not apply q1800   Continuous Infusions:  PRN Meds:.acetaminophen, HYDROmorphone (DILAUDID) injection, lidocaine-prilocaine, LORazepam, ondansetron (ZOFRAN) IV, zolpidem  Antibiotics: Anti-infectives   Start     Dose/Rate Route Frequency Ordered Stop   05/12/13 1000  vancomycin (VANCOCIN) 1,500 mg in sodium chloride 0.9 % 500 mL IVPB     1,500 mg 250 mL/hr over 120 Minutes Intravenous Every 24 hours 05/11/13 1017     05/12/13 0400  levofloxacin (LEVAQUIN) IVPB 750 mg     750 mg 100 mL/hr over 90 Minutes Intravenous Every 24 hours 05/11/13 0605     05/11/13 1030  vancomycin (VANCOCIN) 2,000 mg in sodium chloride 0.9 % 500 mL IVPB     2,000 mg 250  mL/hr over 120 Minutes Intravenous NOW 05/11/13 1017 05/11/13 1310   05/11/13 0430  levofloxacin (LEVAQUIN) IVPB 750 mg     750 mg 100 mL/hr over 90 Minutes Intravenous  Once 05/11/13 0419 05/11/13 0551       PHYSICAL EXAM: Vital signs in last 24 hours: Filed Vitals:   05/11/13 0615 05/11/13 0734 05/11/13 0755 05/11/13 0831  BP: 133/51 117/48 99/46   Pulse: 87 78 78   Temp:   98.3 F (36.8 C)   TempSrc:   Oral   Resp: 15 15 18    Height:    5' 6.5" (1.689 m)   Weight:    97.7 kg (215 lb 6.2 oz)  SpO2: 96% 97% 98%     Weight change:  Filed Weights   05/11/13 0831  Weight: 97.7 kg (215 lb 6.2 oz)   Body mass index is 34.25 kg/(m^2).   Gen Exam: Awake and alert with clear speech.   Neck: Supple, No JVD.   Chest: B/L Clear.   CVS: S1 S2 Regular, no murmurs.  Abdomen: soft, BS +, non tender, mildly distended with dullness of the flanks Extremities: trace edema, lower extremities warm to touch. Neurologic: Non Focal.   Skin: No Rash.   Wounds: N/A.   Intake/Output from previous day:  Intake/Output Summary (Last 24 hours) at 05/11/13 1313 Last data filed at 05/11/13 3875  Gross per 24 hour  Intake      0 ml  Output    300 ml  Net   -300 ml     LAB RESULTS: CBC  Recent Labs Lab 05/11/13 0335 05/11/13 0349  WBC 7.6  --   HGB 9.9* 11.2*  HCT 31.5* 33.0*  PLT 108*  --   MCV 80.2  --   MCH 25.2*  --   MCHC 31.4  --   RDW 17.1*  --     Chemistries   Recent Labs Lab 05/11/13 0335 05/11/13 0349  NA 133* 137  K 4.1 4.1  CL 94* 96  CO2 27  --   GLUCOSE 124* 123*  BUN 10 10  CREATININE 0.88 1.00  CALCIUM 8.9  --     CBG:  Recent Labs Lab 05/11/13 0737 05/11/13 0909  GLUCAP 128* 128*    GFR Estimated Creatinine Clearance: 60.5 ml/min (by C-G formula based on Cr of 1).  Coagulation profile  Recent Labs Lab 05/08/13 1122 05/11/13 0705  INR 2.20 2.21*  PROTIME 26.4*  --     Cardiac Enzymes No results found for this basename: CK, CKMB, TROPONINI, MYOGLOBIN,  in the last 168 hours  No components found with this basename: POCBNP,  No results found for this basename: DDIMER,  in the last 72 hours No results found for this basename: HGBA1C,  in the last 72 hours No results found for this basename: CHOL, HDL, LDLCALC, TRIG, CHOLHDL, LDLDIRECT,  in the last 72 hours No results found for this basename: TSH, T4TOTAL, FREET3, T3FREE, THYROIDAB,  in the last 72 hours No results found for this basename:  VITAMINB12, FOLATE, FERRITIN, TIBC, IRON, RETICCTPCT,  in the last 72 hours No results found for this basename: LIPASE, AMYLASE,  in the last 72 hours  Urine Studies No results found for this basename: UACOL, UAPR, USPG, UPH, UTP, UGL, UKET, UBIL, UHGB, UNIT, UROB, ULEU, UEPI, UWBC, URBC, UBAC, CAST, CRYS, UCOM, BILUA,  in the last 72 hours  MICROBIOLOGY: No results found for this or any previous visit (from the past 240 hour(s)).  RADIOLOGY STUDIES/RESULTS: Ct Chest W Contrast  05/11/2013   CLINICAL DATA:  Fever of unknown origin. Current history of pancreatic cancer.  EXAM: CT CHEST WITH CONTRAST  TECHNIQUE: Multidetector CT imaging of the chest was performed during intravenous contrast administration.  CONTRAST:  139mL OMNIPAQUE IOHEXOL 300 MG/ML IV.  COMPARISON:  CT chest 10/25/2012.  FINDINGS: Interval development of interstitial opacities throughout both lungs since the prior CT. Patchy airspace consolidation deep in the left lower lobe. No localized airspace consolidation elsewhere. No pulmonary parenchymal nodules to suggest metastatic disease. Very small bilateral pleural effusions and associated passive atelectasis posteriorly in the lower lobes.  Interval progression of mediastinal lymphadenopathy since the prior CT. Index subcarinal node measures approximately 1.8 x 2.4 cm (series 2, image 25), previously 1.0 x 1.8 cm. Mildly enlarged right hilar lymph nodes and right intersegmental lymph nodes, also progressive. No axillary lymphadenopathy.  Right subclavian Port-A-Cath tip at the cavoatrial junction. Heart enlarged with left ventricular predominance, unchanged. No pericardial effusion. Moderate atherosclerosis involving the thoracic and upper abdominal aorta.  Small to moderate amount of upper abdominal ascites. Numerous metastases within the visualized liver. Splenomegaly, with interval increase in size of the spleen. Thrombosis involving the main portal vein.  Bone window images demonstrate  osseous demineralization and degenerative changes throughout the thoracic spine.  IMPRESSION: 1. Interstitial opacities throughout both lungs. 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 2. Progressive mediastinal and right hilar lymphadenopathy. 3. Small bilateral pleural effusions and associated passive atelectasis in the lower lobes. Patchy pneumonia M deep in the left lower lobe. 4. Upper abdominal ascites. 5. Numerous liver metastases. 6. Portal vein thrombosis. 7. Splenomegaly, with interval increase in the spleen size since the prior CT from September, 2014.   Electronically Signed   By: Evangeline Dakin M.D.   On: 05/11/2013 07:07   Ct Abdomen Pelvis W Contrast  04/16/2013   CLINICAL DATA:  Pancreatic cancer diagnosed in August 2014. Chemotherapy complete. Abdominal pain.  EXAM: CT ABDOMEN AND PELVIS WITH CONTRAST  TECHNIQUE: Multidetector CT imaging of the abdomen and pelvis was performed using the standard protocol following bolus administration of intravenous contrast.  CONTRAST:  169mL OMNIPAQUE IOHEXOL 300 MG/ML  SOLN  COMPARISON:  CT of the abdomen 02/10/2013.  FINDINGS: Lung Bases: 4 mm subpleural nodule along the lateral aspect of the right major fissure (image 2 of series 9) is unchanged and nonspecific. 5 mm subpleural nodule in the inferior segment of the lingula (image 9 of series 9) is new compared to the prior study, but nonspecific. Small right and trace left pleural effusions appear to be new compared to the prior examination.  Abdomen/Pelvis: Again noted is a complex lesion involving predominantly the inferior aspect of the pancreatic head, which shows evidence of direct invasion into the superior mesenteric vein and portal vein, completely occluding the splenoportal confluence, now extending to the bifurcation of the portal vein, into the proximal right main portal vein, and into the more distal aspects  of the left main portal vein and branches in the left hepatic lobe. The largest portion of this tumor appears to be tumor thrombus, which is irregular in shape and best measured on coronal and sagittal reformats where this portion of the tumor is estimated to measure approximately 5.6 x 2.4 x 2.2 cm. This tumor thrombus appears intimately associated with the undersurface of the common hepatic artery, without an intervening fat plane, which may suggest early invasion. This lesion appears separate  from the superior mesenteric artery at this time. In the inferior aspect of the pancreatic head (image 68 of series 3) there is a separate hypovascular focus of tumor measuring 1.6 x 1.5 cm (unchanged). Well-defined 1 cm low-attenuation lesion in the mid body of the pancreas is unchanged compared to the most recent prior study.  Associated with increasing tumor thrombus and greater involvement of the portal veins there is progressively increased cavernous transformation in the porta hepatis. This is associated with heterogeneous hepatic perfusion. There appear to be multiple new ill-defined hypovascular areas within the liver, several of which are concerning for potential metastasis, largest of which is in the inferior aspect of segment 6 of the liver (image 41 of series 5) measuring approximately 4.1 x 2.5 cm. Innumerable small peripancreatic lymph nodes are conspicuous in number rather than size, and there is diffuse peripancreatic stranding with extension of inflammatory/edematous changes into the surrounding retroperitoneal tissues. Trace volume of ascites, most pronounced in the pericolic gutters bilaterally.  Numerous high attenuation foci lying dependently in the lumen of the gallbladder, compatible with small gallstones. No current findings of acute cholecystitis at this time. Spleen is enlarged measuring 16.9 x 9.9 x 14.1 cm. Multiple small low-attenuation renal lesions bilaterally, several of which are too small to  definitively characterize, but the largest lesion measures 2.9 x 2.5 cm in the lateral aspect of the interpolar region of the left kidney, compatible with a small simple cyst. Small volume of ascites tracking into the pelvis. No pneumoperitoneum. No pathologic distention of small bowel.  Musculoskeletal: There are no aggressive appearing lytic or blastic lesions noted in the visualized portions of the skeleton.  IMPRESSION: 1. Today's study demonstrates progression of disease with further expansion of tumor thrombus which now extends from the SMV through splenoportal confluence into the portal venous system involving the main portal vein, proximal right main portal vein, and extensive involvement of the left portal venous system, with increasing cavernous transformation in the porta hepatis and worsening splenomegaly. In addition, there are several new hypovascular areas in the liver concerning for metastatic disease. These could be confirmed with MRI of the abdomen with and without IV gadolinium if clinically appropriate. 2. Slight interval increased in ascites, and development of new bilateral pleural effusions (small on the right and trace on the left), with increasing soft tissue stranding throughout the retroperitoneum adjacent to the pancreas, as well as increasing number of numerous small peripancreatic and retroperitoneal lymph nodes. 3. Additional incidental findings, as above.   Electronically Signed   By: Vinnie Langton M.D.   On: 04/16/2013 09:25   Mr Liver W Wo Contrast  04/21/2013   CLINICAL DATA:  Pancreatic cancer diagnosed 8/14. Finished chemotherapy and radiation therapy. Nausea and abdominal pain. Evaluate for liver metastasis.  EXAM: MRI ABDOMEN WITHOUT AND WITH CONTRAST  TECHNIQUE: Multiplanar, multisequence MR imaging was performed both before and after administration of intravenous contrast.  CONTRAST:  9cc Eovist, a mixed extracellular and hepatobiliary phase agent  COMPARISON:  CT  ABD/PELVIS W CM dated 04/16/2013; CT ABD/PELVIS W CM dated 02/10/2013; CT ABD/PELVIS W CM dated 10/01/2012; CT ANGIO ABDOMEN W/CM &/OR WO/CM dated 10/03/2012  FINDINGS: Small right greater the left bilateral pleural effusions which are not significantly changed. Small volume upper abdominal ascites is also similar.  Hepatomegaly and splenomegaly. Liver 19.6 cm craniocaudal. The spleen measures 14.8 cm craniocaudal.  Heterogeneous hepatic steatosis, including within the posterior right hepatic lobe on image 19/series 8 and image 35/ series 8. Concurrent extensive  bilateral hepatic metastasis. Example lesions within the anterior segment right liver lobe at 1.6 cm on image 16/ series 5. Medial segment left liver lobe on image 16/ series 5 x 1.3 cm. These areas demonstrate restricted diffusion, including on image 59/series 6.  Normal stomach.  Cystic pancreatic neck and head lesions are similar to on the recent CT.  Thrombus within the superior mesenteric vein, splenoportal confluence, main portal vein, a left greater than right portal veins is similar to on the recent CT.  Normal adrenal glands. Bilateral renal cysts. Mild peripancreatic edema which could be treatment related. Small peripancreatic nodes which are indeterminate.  IMPRESSION: 1. The appearance of the liver on CT is secondary to primarily extensive metastasis. There is concurrent heterogeneous hepatic steatosis. 2. Extensive thrombus within the portal vein, superior mesenteric vein, and branches, similar to on the prior CT. 3. Hepatosplenomegaly and small volume upper abdominal ascites, as before. 4. Similar small bilateral pleural effusions.   Electronically Signed   By: Abigail Miyamoto M.D.   On: 04/21/2013 12:24   Dg Chest Portable 1 View  05/11/2013   CLINICAL DATA:  Weakness, cough, shortness of breath  EXAM: PORTABLE CHEST - 1 VIEW  COMPARISON:  Prior radiograph from 01/14/2013  FINDINGS: Cardiomegaly is stable as compared to prior study. Left-sided  Port-A-Cath noted.  Lungs are hypoinflated. There is diffuse pulmonary vascular congestion without overt pulmonary edema. Bibasilar patchy opacities may reflect atelectasis or possibly infiltrates. No definite pleural effusion. No pneumothorax.  No acute osseus abnormality. Severe degenerative changes noted about the [facility] joints bilaterally.  IMPRESSION: 1. Stable cardiomegaly with mild diffuse pulmonary vascular congestion without overt pulmonary edema. 2. Shallow lung inflation with patchy bibasilar opacities, likely atelectasis. Possible infiltrate within the left lower lobe could be considered in the correct clinical setting.   Electronically Signed   By: Jeannine Boga M.D.   On: 05/11/2013 05:30    Oren Binet, MD  [facility] Pager:[phone]  If 7PM-7AM, please contact night-coverage [email or web address] Password [email or web address] 05/11/2013, 1:13 PM   LOS: 0 days

## 2013-05-11 NOTE — Progress Notes (Signed)
ANTICOAGULATION CONSULT NOTE - Initial Consult  Pharmacy Consult for Warfarin Indication: portal vein thrombosis  Allergies  Allergen Reactions  . Oxycodone Hcl Nausea Only  . Latex Rash  . Zithromax [Azithromycin] Other (See Comments)    thrush    Patient Measurements: Height: 5' 6.5" (168.9 cm) IBW/kg (Calculated) : 60.45  Vital Signs: Temp: 98.3 F (36.8 C) (03/22 0755) Temp src: Oral (03/22 0755) BP: 99/46 mmHg (03/22 0755) Pulse Rate: 78 (03/22 0755)  Labs:  Recent Labs  05/08/13 1122 05/11/13 0335 05/11/13 0349 05/11/13 0705  HGB  --  9.9* 11.2*  --   HCT  --  31.5* 33.0*  --   PLT  --  108*  --   --   LABPROT  --   --   --  23.8*  INR 2.20  --   --  2.21*  CREATININE  --  0.88 1.00  --    The CrCl is unknown because both a height and weight (above a minimum accepted value) are required for this calculation.   Medical History: Past Medical History  Diagnosis Date  . Trigger finger   . Hyperlipidemia   . Diabetes mellitus   . Chronic venous insufficiency   . Morbid obesity   . Hypertension   . Thyroid disease   . GERD (gastroesophageal reflux disease)   . Hypothyroidism   . Arthritis   . Anemia   . Common bile duct dilatation 10/01/12  . Fatty liver 10/01/12  . Portal vein thrombosis   . Cholelithiasis   . Pancreatic lesion 10/01/12  . Hydroureteronephrosis     right  . Pancreatic mass 2014  . Cancer 10/17/12     pancreatic   . Allergy   . Anxiety   . Neuromuscular disorder     neuropathy    Medications:  Scheduled:  . doxazosin  8 mg Oral QHS  . escitalopram  20 mg Oral q morning - 10a  . famotidine  20 mg Oral QHS  . furosemide  40 mg Oral q morning - 10a  . insulin aspart  0-15 Units Subcutaneous 6 times per day  . insulin glargine  40 Units Subcutaneous Daily  . [START ON 05/12/2013] levofloxacin (LEVAQUIN) IV  750 mg Intravenous Q24H  . levothyroxine  88 mcg Oral QAC breakfast  . polyethylene glycol  17 g Oral Daily  . potassium  chloride SA  20 mEq Oral Daily  . pregabalin  150 mg Oral QHS  . pregabalin  75 mg Oral Daily  . simvastatin  20 mg Oral QHS   Infusions:    Assessment: 31 yoF admitted on 3/22 with fever and AMS.  PMH is significant for pancreatic cancer, ascites, and chronic anticoagulation with warfarin for history of portal vein thrombosis.  Warfarin home dose is 5 mg daily except 7.5 mg on Tuesday, Thursdays, and Saturdays.  INR on admission is therapeutic.  Pharmacy is consulted to continue warfarin dosing inpatient.    INR = 2.21, therapeutic  CBC: Hgb 11.2, Plt 108  Drug-drug interaction with Levaquin (increases warfarin anticoagulant effect)  Noted plan for paracentesis by IR while pt remains on warfarin.  Goal of Therapy:  INR 2-3 Monitor platelets by anticoagulation protocol: Yes   Plan:   Warfarin 4mg  tonight at 1800 (conservative dosing d/t Levaquin and paracentesis)  Daily INR   Gretta Arab PharmD, BCPS Pager [phone] 05/11/2013 8:50 AM

## 2013-05-11 NOTE — ED Notes (Signed)
Patient transported to CT 

## 2013-05-11 NOTE — Progress Notes (Signed)
Pt received in 1409. Alert and oriented. O2 at 2l [place]. Came from ED on stretcher pushed by nurse tech.  Arrived in good condition. Voided upon arrival using the [facility]. Pt noted to be unsteady on feet. All admission assessments done. Pt made comfortable. Resting in bed at the moment. dtr at bedside. Vwilliams,rn.

## 2013-05-11 NOTE — ED Notes (Signed)
MD at bedside. 

## 2013-05-11 NOTE — ED Notes (Signed)
Patient requesting pain medication  Will make Dr. Marnette Burgess aware

## 2013-05-11 NOTE — ED Notes (Signed)
Per EMS pt is stage IV pancreatic CA and had taken more of her dilaudid than prescribed.  Pt was slow to respond at first is now alert however not at her baseline.

## 2013-05-11 NOTE — Progress Notes (Signed)
ANTIBIOTIC CONSULT NOTE - INITIAL  Pharmacy Consult for Vancomycin Indication: pneumonia  Allergies  Allergen Reactions  . Oxycodone Hcl Nausea Only  . Latex Rash  . Zithromax [Azithromycin] Other (See Comments)    thrush    Patient Measurements: Height: 5' 6.5" (168.9 cm) Weight: 215 lb 6.2 oz (97.7 kg) IBW/kg (Calculated) : 60.45  Vital Signs: Temp: 98.3 F (36.8 C) (03/22 0755) Temp src: Oral (03/22 0755) BP: 99/46 mmHg (03/22 0755) Pulse Rate: 78 (03/22 0755) Intake/Output from previous day: 03/21 0701 - 03/22 0700 In: -  Out: 300 [Urine:300]  Labs:  Recent Labs  05/11/13 0335 05/11/13 0349  WBC 7.6  --   HGB 9.9* 11.2*  PLT 108*  --   CREATININE 0.88 1.00   Estimated Creatinine Clearance: 60.5 ml/min (by C-G formula based on Cr of 1).  Microbiology: No results found for this or any previous visit (from the past 720 hour(s)).  Medical History: Past Medical History  Diagnosis Date  . Trigger finger   . Hyperlipidemia   . Diabetes mellitus   . Chronic venous insufficiency   . Morbid obesity   . Hypertension   . Thyroid disease   . GERD (gastroesophageal reflux disease)   . Hypothyroidism   . Arthritis   . Anemia   . Common bile duct dilatation 10/01/12  . Fatty liver 10/01/12  . Portal vein thrombosis   . Cholelithiasis   . Pancreatic lesion 10/01/12  . Hydroureteronephrosis     right  . Pancreatic mass 2014  . Cancer 10/17/12     pancreatic   . Allergy   . Anxiety   . Neuromuscular disorder     neuropathy    Medications:  Anti-infectives   Start     Dose/Rate Route Frequency Ordered Stop   05/12/13 0400  levofloxacin (LEVAQUIN) IVPB 750 mg     750 mg 100 mL/hr over 90 Minutes Intravenous Every 24 hours 05/11/13 0605     05/11/13 0430  levofloxacin (LEVAQUIN) IVPB 750 mg     750 mg 100 mL/hr over 90 Minutes Intravenous  Once 05/11/13 0419 05/11/13 0551     Assessment: 47 yoF admitted on 3/22 with fever and AMS.  PMH is significant  for pancreatic cancer with last chemo on 3/12, ascites with planned paracentesis this admission, and history of portal vein thrombosis on warfarin.  She is suspected of having CAP and was started on Levaquin per MD.  Pharmacy is consulted to dose Vancomycin.  Tmax: 103.7  WBCs: 7.6  Renal: SCr 1, CrCl ~ 58 ml/min Normalized  Goal of Therapy:  Vancomycin trough level 15-20 mcg/ml  Plan:   Vancomycin 2g IV x1 dose now, then 1500mg  IV q24h.  Measure Vanc trough at steady state.  Follow up renal fxn and culture results.   Gretta Arab PharmD, BCPS Pager [phone] 05/11/2013 10:13 AM

## 2013-05-11 NOTE — ED Provider Notes (Signed)
CSN: [id number]     Arrival date & time 05/11/13  0317 History   First MD Initiated Contact with Patient 05/11/13 [phone]     Chief Complaint  Patient presents with  . Fever     (Consider location/radiation/quality/duration/timing/severity/associated sxs/prior Treatment) HPI History provided the patient. Stage IV pancreatic cancer, lives at home with her husband. Tonight developed fever, has ongoing cough since yesterday. She has persistent unchanged, pain sharp in quality. Concerned that she may have taken too much narcotic pain medication and aspirated, blood in by EMS to be hypoxic, tachycardic with fever to 103. No dysuria, urgency or frequency.  Past Medical History  Diagnosis Date  . Trigger finger   . Hyperlipidemia   . Diabetes mellitus   . Chronic venous insufficiency   . Morbid obesity   . Hypertension   . Thyroid disease   . GERD (gastroesophageal reflux disease)   . Hypothyroidism   . Arthritis   . Anemia   . Common bile duct dilatation 10/01/12  . Fatty liver 10/01/12  . Portal vein thrombosis   . Cholelithiasis   . Pancreatic lesion 10/01/12  . Hydroureteronephrosis     right  . Pancreatic mass 2014  . Cancer 10/17/12     pancreatic   . Allergy   . Anxiety   . Neuromuscular disorder     neuropathy   Past Surgical History  Procedure Laterality Date  . Lumbar laminectomy    . Breast biopsy    . Eus N/A 10/17/2012    Procedure: UPPER ENDOSCOPIC ULTRASOUND (EUS) LINEAR;  Surgeon: Milus Banister, MD;  Location: [facility];  Service: Endoscopy;  Laterality: N/A;  . Portacath placement Left 11/19/2012    Procedure: INSERTION PORT-A-CATH;  Surgeon: Stark Klein, MD;  Location: [facility];  Service: General;  Laterality: Left;   Family History  Problem Relation Age of Onset  . Heart disease Father   . Arthritis Father   . Breast cancer Mother   . Lymphoma Sister     #1  . Diabetes Sister     #1  . Diabetes Sister     #2  . Diabetes Brother     #1  . Diabetes  Brother     #2  . Diabetes Other     siblings  . Hypertension Other     siblings  . Arthritis Other     siblings   History  Substance Use Topics  . Smoking status: Never Smoker   . Smokeless tobacco: Never Used  . Alcohol Use: No   OB History   Grav Para Term Preterm Abortions TAB SAB Ect Mult Living                 Review of Systems  Constitutional: Positive for fever and chills.  Respiratory: Positive for cough and shortness of breath.   Cardiovascular: Negative for chest pain.  Gastrointestinal: Positive for abdominal pain.  Genitourinary: Negative for dysuria.  Musculoskeletal: Negative for back pain.  Skin: Negative for rash.  Neurological: Negative for headaches.  All other systems reviewed and are negative.      Allergies  Oxycodone hcl; Latex; and Zithromax  Home Medications   Current Outpatient Rx  Name  Route  Sig  Dispense  Refill  . acetaminophen (TYLENOL) 325 MG tablet   Oral   Take 650 mg by mouth daily as needed for pain. For pain         . Biotin 5000 MCG CAPS   Oral  Take 5,000 mcg by mouth at bedtime.          Marland Kitchen doxazosin (CARDURA) 8 MG tablet   Oral   Take 8 mg by mouth at bedtime.          Marland Kitchen escitalopram (LEXAPRO) 20 MG tablet   Oral   Take 20 mg by mouth every morning.         . famotidine (PEPCID) 20 MG tablet   Oral   Take 20 mg by mouth at bedtime.          . furosemide (LASIX) 40 MG tablet   Oral   Take 40 mg by mouth every morning.          Marland Kitchen HYDROmorphone HCl (DILAUDID) 1 MG/ML LIQD   Oral   Take 2 mLs (2 mg total) by mouth every 4 (four) hours as needed for severe pain.   150 mL   0   . Insulin Glargine (LANTUS SOLOSTAR) 100 UNIT/ML SOPN   Subcutaneous   Inject 70 Units into the skin every morning.         Marland Kitchen levothyroxine (SYNTHROID, LEVOTHROID) 88 MCG tablet   Oral   Take 88 mcg by mouth daily before breakfast. Name brand only         . lidocaine-prilocaine (EMLA) cream   Topical   Apply 1  application topically as needed (topical numbing).         . LORazepam (ATIVAN) 0.5 MG tablet   Oral   Take 0.5 mg by mouth every 6 (six) hours as needed for anxiety (or nausea). As needed for nausea or vomiting.         . ondansetron (ZOFRAN-ODT) 8 MG disintegrating tablet   Oral   Take 1 tablet (8 mg total) by mouth every 8 (eight) hours as needed for nausea or vomiting.   20 tablet   1   . polyethylene glycol (MIRALAX / GLYCOLAX) packet   Oral   Take 17 g by mouth daily.         . potassium chloride (KLOR-CON) 20 MEQ packet   Oral   Take 20 mEq by mouth every morning.          . pregabalin (LYRICA) 75 MG capsule   Oral   Take 75-150 mg by mouth 2 (two) times daily. 75mg  in the morning and 150mg  at bedtime         . simvastatin (ZOCOR) 20 MG tablet   Oral   Take 20 mg by mouth at bedtime.         . temazepam (RESTORIL) 30 MG capsule   Oral   Take 30 mg by mouth at bedtime as needed for sleep.         . traMADol (ULTRAM) 50 MG tablet   Oral   Take 50 mg by mouth every 6 (six) hours as needed for moderate pain.         Marland Kitchen warfarin (COUMADIN) 5 MG tablet   Oral   Take 5-7.5 mg by mouth every evening. Take 7.5mg  every day except take 5mg  Monday, Wednesday, and Friday.          BP 142/50  Pulse 92  Temp(Src) 103.7 F (39.8 C) (Rectal)  Resp 14  SpO2 96% Physical Exam  Constitutional: She is oriented to person, place, and time. She appears well-developed and well-nourished.  HENT:  Head: Normocephalic and atraumatic.  Eyes: EOM are normal. Pupils are equal, round, and reactive to light.  Neck:  Neck supple.  Cardiovascular: Regular rhythm and intact distal pulses.   Tachycardic heart rate 102  Pulmonary/Chest:  Equal anterior breath sounds without respiratory distress  Abdominal: Soft. Bowel sounds are normal.  distention and diffuse abdominal tenderness  Musculoskeletal: Normal range of motion.  Neurological: She is alert and oriented to  person, place, and time.  Skin: Skin is warm and dry.    ED Course  Procedures (including critical care time) Labs Review Labs Reviewed  URINALYSIS, ROUTINE W REFLEX MICROSCOPIC - Abnormal; Notable for the following:    Color, Urine AMBER (*)    APPearance CLOUDY (*)    Bilirubin Urine SMALL (*)    Protein, ur 30 (*)    Leukocytes, UA TRACE (*)    All other components within normal limits  CBC - Abnormal; Notable for the following:    Hemoglobin 9.9 (*)    HCT 31.5 (*)    MCH 25.2 (*)    RDW 17.1 (*)    Platelets 108 (*)    All other components within normal limits  COMPREHENSIVE METABOLIC PANEL - Abnormal; Notable for the following:    Sodium 133 (*)    Chloride 94 (*)    Glucose, Bld 124 (*)    Albumin 3.2 (*)    AST 50 (*)    Alkaline Phosphatase 171 (*)    GFR calc non Af Amer 64 (*)    GFR calc Af Amer 74 (*)    All other components within normal limits  URINE MICROSCOPIC-ADD ON - Abnormal; Notable for the following:    Squamous Epithelial / LPF FEW (*)    Casts HYALINE CASTS (*)    Crystals CA OXALATE CRYSTALS (*)    All other components within normal limits  I-STAT CHEM 8, ED - Abnormal; Notable for the following:    Glucose, Bld 123 (*)    Hemoglobin 11.2 (*)    HCT 33.0 (*)    All other components within normal limits  PROTIME-INR  I-STAT CG4 LACTIC ACID, ED   Imaging Review Dg Chest Portable 1 View  05/11/2013   CLINICAL DATA:  Weakness, cough, shortness of breath  EXAM: PORTABLE CHEST - 1 VIEW  COMPARISON:  Prior radiograph from 01/14/2013  FINDINGS: Cardiomegaly is stable as compared to prior study. Left-sided Port-A-Cath noted.  Lungs are hypoinflated. There is diffuse pulmonary vascular congestion without overt pulmonary edema. Bibasilar patchy opacities may reflect atelectasis or possibly infiltrates. No definite pleural effusion. No pneumothorax.  No acute osseus abnormality. Severe degenerative changes noted about the [facility] joints bilaterally.  IMPRESSION:  1. Stable cardiomegaly with mild diffuse pulmonary vascular congestion without overt pulmonary edema. 2. Shallow lung inflation with patchy bibasilar opacities, likely atelectasis. Possible infiltrate within the left lower lobe could be considered in the correct clinical setting.   Electronically Signed   By: Jeannine Boga M.D.   On: 05/11/2013 05:30    Room air pulse ox 86% his hypoxic Improved with 2 L oxygen nasal cannula IV antibiotics provided Tylenol for fever  5:59 AM medicine consult and Dr. Alcario Drought to admit, requesting CT chest  MDM   Diagnosis: CAP, sepsis  Evaluated with labs and imaging as above, medications provided MED admit      Teressa Lower, MD 05/11/13 [phone]

## 2013-05-11 NOTE — ED Notes (Signed)
Attempted to call report to floor--nurse not ready Call back number given

## 2013-05-11 NOTE — ED Notes (Addendum)
86-88% on RA 2L [place] applied to patient

## 2013-05-12 ENCOUNTER — Telehealth: Payer: Self-pay | Admitting: *Deleted

## 2013-05-12 DIAGNOSIS — D638 Anemia in other chronic diseases classified elsewhere: Secondary | ICD-10-CM

## 2013-05-12 DIAGNOSIS — E1142 Type 2 diabetes mellitus with diabetic polyneuropathy: Secondary | ICD-10-CM

## 2013-05-12 DIAGNOSIS — R109 Unspecified abdominal pain: Secondary | ICD-10-CM

## 2013-05-12 DIAGNOSIS — R161 Splenomegaly, not elsewhere classified: Secondary | ICD-10-CM

## 2013-05-12 DIAGNOSIS — G893 Neoplasm related pain (acute) (chronic): Secondary | ICD-10-CM

## 2013-05-12 DIAGNOSIS — T451X5A Adverse effect of antineoplastic and immunosuppressive drugs, initial encounter: Secondary | ICD-10-CM

## 2013-05-12 DIAGNOSIS — D6959 Other secondary thrombocytopenia: Secondary | ICD-10-CM

## 2013-05-12 DIAGNOSIS — D6481 Anemia due to antineoplastic chemotherapy: Secondary | ICD-10-CM

## 2013-05-12 DIAGNOSIS — C787 Secondary malignant neoplasm of liver and intrahepatic bile duct: Secondary | ICD-10-CM

## 2013-05-12 LAB — GLUCOSE, CAPILLARY
GLUCOSE-CAPILLARY: 119 mg/dL — AB (ref 70–99)
Glucose-Capillary: 111 mg/dL — ABNORMAL HIGH (ref 70–99)
Glucose-Capillary: 107 mg/dL — ABNORMAL HIGH (ref 70–99)
Glucose-Capillary: 113 mg/dL — ABNORMAL HIGH (ref 70–99)
Glucose-Capillary: 159 mg/dL — ABNORMAL HIGH (ref 70–99)
Glucose-Capillary: 128 mg/dL — ABNORMAL HIGH (ref 70–99)

## 2013-05-12 LAB — PROTIME-INR
INR: 2.16 — AB (ref 0.00–1.49)
Prothrombin Time: 23.4 seconds — ABNORMAL HIGH (ref 11.6–15.2)

## 2013-05-12 LAB — CBC
HEMATOCRIT: 29.1 % — AB (ref 36.0–46.0)
HEMOGLOBIN: 8.6 g/dL — AB (ref 12.0–15.0)
MCH: 24.4 pg — ABNORMAL LOW (ref 26.0–34.0)
MCHC: 29.6 g/dL — ABNORMAL LOW (ref 30.0–36.0)
MCV: 82.7 fL (ref 78.0–100.0)
Platelets: 110 10*3/uL — ABNORMAL LOW (ref 150–400)
RBC: 3.52 MIL/uL — AB (ref 3.87–5.11)
RDW: 17.2 % — ABNORMAL HIGH (ref 11.5–15.5)
WBC: 4.8 10*3/uL (ref 4.0–10.5)

## 2013-05-12 MED ORDER — GUAIFENESIN ER 600 MG PO TB12
600.0000 mg | ORAL_TABLET | Freq: Two times a day (BID) | ORAL | Status: DC | PRN
  Administered 2013-05-12: 600 mg via ORAL
  Filled 2013-05-12: qty 1

## 2013-05-12 MED ORDER — ENSURE COMPLETE PO LIQD: 237.0000 mL | Freq: Two times a day (BID) | ORAL | Status: DC | PRN

## 2013-05-12 MED ORDER — FENTANYL 50 MCG/HR TD PT72
50.0000 ug | MEDICATED_PATCH | TRANSDERMAL | Status: DC
  Administered 2013-05-12: 50 ug via TRANSDERMAL
  Filled 2013-05-12: qty 1

## 2013-05-12 MED ORDER — LORATADINE 10 MG PO TABS
10.0000 mg | ORAL_TABLET | Freq: Every day | ORAL | Status: DC
  Administered 2013-05-12 – 2013-05-13 (×2): 10 mg via ORAL
  Filled 2013-05-12 (×2): qty 1

## 2013-05-12 MED ORDER — TEMAZEPAM 15 MG PO CAPS
15.0000 mg | ORAL_CAPSULE | Freq: Every evening | ORAL | Status: DC | PRN
  Administered 2013-05-12 (×2): 15 mg via ORAL
  Filled 2013-05-12 (×2): qty 1

## 2013-05-12 MED ORDER — HYDROMORPHONE HCL 1 MG/ML PO LIQD
2.0000 mg | ORAL | Status: DC | PRN
  Administered 2013-05-12 – 2013-05-13 (×4): 2 mg via ORAL
  Filled 2013-05-12 (×4): qty 2

## 2013-05-12 MED ORDER — WARFARIN SODIUM 6 MG PO TABS
6.0000 mg | ORAL_TABLET | Freq: Once | ORAL | Status: AC
  Administered 2013-05-12: 6 mg via ORAL
  Filled 2013-05-12: qty 1

## 2013-05-12 NOTE — Evaluation (Signed)
Physical Therapy Evaluation Patient Details Name: Theresa Summers MRN: [id number] DOB: 1940-05-28 Today's Date: 05/12/2013 Time: 4098-1191 PT Time Calculation (min): 21 min  PT Assessment / Plan / Recommendation History of Present Illness  73 yo female admitted with fever. hx of pancreatic cancer  Clinical Impression  On eval, pt required Min assist for mobility-able to ambulate ~100 feet with walker. Unsteady at times. Fatigues fairly easily. Recommend RW, 3n1. (HHPT if possible-noted pt planning to d/c home with hospice). Will follow during stay.     PT Assessment  Patient needs continued PT services    Follow Up Recommendations  Home health PT (if possible. Noted pt is planning to go home with hospice)    Does the patient have the potential to tolerate intense rehabilitation      Barriers to Discharge        Equipment Recommendations  Rolling walker with 5" wheels;3in1 (PT)    Recommendations for Other Services OT consult   Frequency Min 3X/week    Precautions / Restrictions Precautions Precautions: Fall Restrictions Weight Bearing Restrictions: No   Pertinent Vitals/Pain       Mobility  Bed Mobility Overal bed mobility: Needs Assistance Bed Mobility: Sit to Supine Sit to supine: Min assist General bed mobility comments: Assist for LEs onto bed.  Transfers Overall transfer level: Needs assistance Equipment used: Rolling walker (2 wheeled) Transfers: Sit to/from Stand Sit to Stand: Min guard General transfer comment: close guard for safety. VCs safety, hand placement Ambulation/Gait Ambulation/Gait assistance: Min assist Ambulation Distance (Feet): 100 Feet Assistive device: Rolling walker (2 wheeled) Gait Pattern/deviations: Step-through pattern;Trunk flexed;Decreased stride length General Gait Details: assist to stabilize intermittently. slow gait speed. fatigues easily.     Exercises     PT Diagnosis: Difficulty walking;Generalized weakness;Acute pain   PT Problem List: Decreased strength;Decreased activity tolerance;Decreased balance;Decreased mobility;Decreased knowledge of use of DME;Pain PT Treatment Interventions: DME instruction;Gait training;Stair training;Functional mobility training;Therapeutic activities;Therapeutic exercise;Patient/family education;Balance training     PT Goals(Current goals can be found in the care plan section) Acute Rehab PT Goals Patient Stated Goal: get stronger PT Goal Formulation: With patient/family Time For Goal Achievement: 05/26/13 Potential to Achieve Goals: Fair  Visit Information  Last PT Received On: 05/12/13 Assistance Needed: +1 History of Present Illness: 73 yo female admitted with fever. hx of pancreatic cancer       Prior Functioning  Home Living Family/patient expects to be discharged to:: Private residence Living Arrangements: Spouse/significant other Available Help at Discharge: Family Type of Home: House Home Access: Stairs to enter Technical brewer of Steps: 1-2 Home Layout: One level White: None Prior Function Level of Independence: Independent Communication Communication: No difficulties    Cognition  Cognition Arousal/Alertness: Awake/alert Behavior During Therapy: WFL for tasks assessed/performed Memory: Decreased short-term memory    Extremity/Trunk Assessment Upper Extremity Assessment Upper Extremity Assessment: Generalized weakness Lower Extremity Assessment Lower Extremity Assessment: Generalized weakness Cervical / Trunk Assessment Cervical / Trunk Assessment: Normal   Balance    End of Session PT - End of Session Activity Tolerance: Patient tolerated treatment well Patient left: in bed;with call bell/phone within reach;with family/visitor present  GP     Weston Anna, MPT Pager: [phone]

## 2013-05-12 NOTE — Progress Notes (Signed)
IP PROGRESS NOTE  Subjective:   Ms. Theresa Summers was admitted yesterday with a fever and confusion. She continues to have abdominal pain, controlled with the current narcotic regimen. She reports difficulty with balance and "wheezing "this morning. She completed 1 cycle of FOLFOX 05/01/2013. She reports developing abdominal pain after the chemotherapy  Objective: Vital signs in last 24 hours: Blood pressure 134/60, pulse 74, temperature 98.1 F (36.7 C), temperature source Oral, resp. rate 18, height 5' 6.5" (1.689 m), weight 215 lb 6.2 oz (97.7 kg), SpO2 100.00%.  Intake/Output from previous day: 03/22 0701 - 03/23 0700 In: 750 [P.O.:360; IV Piggyback:150] Out: 550 [Urine:550]  Physical Exam:  HEENT: Neck without mass Lungs: Decreased breath sounds with coarse rhonchi at the posterior basis, no respiratory distress Cardiac: Regular rate and rhythm Abdomen: Distended, nontender, no mass Extremities: No leg edema  Portacath/PICC-with surrounding erythema,? Tape reaction  Lab Results:  Recent Labs  05/11/13 0335 05/11/13 0349 05/12/13 0510  WBC 7.6  --  4.8  HGB 9.9* 11.2* 8.6*  HCT 31.5* 33.0* 29.1*  PLT 108*  --  110*    BMET  Recent Labs  05/11/13 0335 05/11/13 0349  NA 133* 137  K 4.1 4.1  CL 94* 96  CO2 27  --   GLUCOSE 124* 123*  BUN 10 10  CREATININE 0.88 1.00  CALCIUM 8.9  --     Studies/Results: Ct Chest W Contrast  05/11/2013   CLINICAL DATA:  Fever of unknown origin. Current history of pancreatic cancer.  EXAM: CT CHEST WITH CONTRAST  TECHNIQUE: Multidetector CT imaging of the chest was performed during intravenous contrast administration.  CONTRAST:  17mL OMNIPAQUE IOHEXOL 300 MG/ML IV.  COMPARISON:  CT chest 10/25/2012.  FINDINGS: Interval development of interstitial opacities throughout both lungs since the prior CT. Patchy airspace consolidation deep in the left lower lobe. No localized airspace consolidation elsewhere. No pulmonary parenchymal nodules  to suggest metastatic disease. Very small bilateral pleural effusions and associated passive atelectasis posteriorly in the lower lobes.  Interval progression of mediastinal lymphadenopathy since the prior CT. Index subcarinal node measures approximately 1.8 x 2.4 cm (series 2, image 25), previously 1.0 x 1.8 cm. Mildly enlarged right hilar lymph nodes and right intersegmental lymph nodes, also progressive. No axillary lymphadenopathy.  Right subclavian Port-A-Cath tip at the cavoatrial junction. Heart enlarged with left ventricular predominance, unchanged. No pericardial effusion. Moderate atherosclerosis involving the thoracic and upper abdominal aorta.  Small to moderate amount of upper abdominal ascites. Numerous metastases within the visualized liver. Splenomegaly, with interval increase in size of the spleen. Thrombosis involving the main portal vein.  Bone window images demonstrate osseous demineralization and degenerative changes throughout the thoracic spine.  IMPRESSION: 1. Interstitial opacities throughout both lungs. 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 2. Progressive mediastinal and right hilar lymphadenopathy. 3. Small bilateral pleural effusions and associated passive atelectasis in the lower lobes. Patchy pneumonia M deep in the left lower lobe. 4. Upper abdominal ascites. 5. Numerous liver metastases. 6. Portal vein thrombosis. 7. Splenomegaly, with interval increase in the spleen size since the prior CT from September, 2014.   Electronically Signed   By: Evangeline Dakin M.D.   On: 05/11/2013 07:07   Dg Chest Portable 1 View  05/11/2013   CLINICAL DATA:  Weakness, cough, shortness of breath  EXAM: PORTABLE CHEST - 1 VIEW  COMPARISON:  Prior radiograph from 01/14/2013  FINDINGS: Cardiomegaly is stable as compared to prior  study. Left-sided Port-A-Cath noted.  Lungs are hypoinflated. There is diffuse  pulmonary vascular congestion without overt pulmonary edema. Bibasilar patchy opacities may reflect atelectasis or possibly infiltrates. No definite pleural effusion. No pneumothorax.  No acute osseus abnormality. Severe degenerative changes noted about the [facility] joints bilaterally.  IMPRESSION: 1. Stable cardiomegaly with mild diffuse pulmonary vascular congestion without overt pulmonary edema. 2. Shallow lung inflation with patchy bibasilar opacities, likely atelectasis. Possible infiltrate within the left lower lobe could be considered in the correct clinical setting.   Electronically Signed   By: Jeannine Boga M.D.   On: 05/11/2013 05:30   Dg Abd 2 Views  05/12/2013   CLINICAL DATA:  Abdominal pain.  Pancreatic cancer  EXAM: ABDOMEN - 2 VIEW  COMPARISON:  CT abdomen 04/16/2013  FINDINGS: The bowel gas pattern is normal. There is no evidence of free air. No radio-opaque calculi or other significant radiographic abnormality is seen. Contrast is noted in the renal collecting system from preceding chest CT.  IMPRESSION: Negative.   Electronically Signed   By: Franchot Gallo M.D.   On: 05/12/2013 07:09    Medications: I have reviewed the patient's current medications.  Assessment/Plan:   1. Adenocarcinoma pancreas, pancreas head mass, endoscopic ultrasound on 10/17/2012 confirmed a pancreas head mass without vascular invasion or surrounding lymphadenopathy, biopsy positive for adenocarcinoma.  Elevated CA 19-9.  Initiation of gemcitabine/Abraxane on a three out of four week schedule on 11/21/2012.  CA 19-9 improved at 571 on 12/25/2012.  CA 19-9 improved at 257 on 01/22/2013  Restaging CT 02/10/2013 with an increase in the pancreas mass and no evidence of progressive metastatic disease  Status post SBRT completed 03/20/2013.  CA 19-9 greater than 140,000 on 04/14/2013.  CT abdomen/pelvis 04/16/2013 showed further expansion of tumor thrombus, worsening splenomegaly; several new hypovascular  areas in the liver concerning for metastatic disease ; slight interval increase in ascites and development of new bilateral pleural effusions with increasing soft tissue stranding throughout the retroperitoneum adjacent to the pancreas as well as increasing number of numerous small peripancreatic and retroperitoneal lymph nodes.  MRI liver 04/21/2013 showed heterogeneous hepatic steatosis; concurrent extensive bilateral hepatic metastasis; cystic pancreatic neck and head lesions similar to the recent CT; thrombus within the superior mesenteric vein, splenoportal confluence, main portal vein, left greater than right portal veins similar to the recent CT; small peripancreatic nodes which are indeterminate  2. 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 She is maintained on Coumadin.  3. Diabetes.  4. Right hydronephrosis-? related to history of kidney stones versus carcinomatosis.  5. "Cirrhotic" changes and splenomegaly reported on the chest CT 10/25/2012.  6. History of kidney stones.  7. Hypothyroidism.  8. Normocytic anemia-secondary to chronic disease and chemotherapy, status post a red cell transfusion 01/15/2013.  9. 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  10. Pain secondary to pancreas cancer.  11. Pain/tenderness, edema, erythema right lower leg,resolved 12/25/2012-negative venous duplex 12/25/2012, 01/08/2013 and 01/14/2013. No improvement with multiple courses of antibiotics. Question pseudo-cellulitis secondary to gemcitabine.  12. Thrombocytopenia secondary to recent chemotherapy 13. Fever-she may have "tumor fever ", no apparent source for infection on review of her exam today other than erythema at the Port-A-Cath site  Ms. Theresa Summers is well-known to me with a history of metastatic pancreas cancer. She completed 1 cycle of second line FOLFOX chemotherapy. Her performance  status appears to be declining. I recommend hospice care. I discussed home hospice  care with Ms. Locher today and she agrees.   Recommendations:  1.continue antibiotics and followup cultures  2.monitor Port-A-Cath site-consider Port-A-Cath removal for persistent fever  3.continue Dilaudid for pain  4.[facility] hospice referral for home care   I appreciate the care from the hospitalist service. I will continue following Ms. Rabenold in the hospital.     LOS: 1 day   Sible Straley  05/12/2013, 11:08 AM

## 2013-05-12 NOTE — Progress Notes (Addendum)
PATIENT DETAILS Name: Theresa Summers Age: 73 y.o. Sex: female Date of Birth: 1940/10/17 Admit Date: 05/11/2013 Admitting Physician Etta Quill, DO VOH:[id number] Deborra Medina, MD  Subjective: No major issues overnight  Assessment/Plan: Principal Problem:  Pneumonia - Improved, afebrile since admission, no leukocytosis however immunocompromised - Continue with vancomycin and Levaquin-day 2 - Blood cultures on 3/22 negative so far  Active Problems: Ascites - Likely malignant- especially given history of pancreatic cancer - See no urgent need for paracentesis at this time. Suspect pneumonia is likely the cause of fever. -Her belly is not tense, she does not appear to be in any distress from ascites-wouold hold off on performing paracentesis  Chronic abdominal pain - Secondary to stage IV pancreatic cancer - Continue with narcotics but will change to oral diluadid, c/w bowel regimen with Miralax and senokot    Portal vein thrombosis - On Coumadin, we'll have pharmacy dose. 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 - Patient claims that she no longer is going to pursue chemotherapy - Had a long discussion with patient and daughter at bedside on 3/22 regarding end-of-life issues, the family will discuss and let us know about CODE STATUS.  Anemia/Thrombocytopenia -secondary to above-continue to monitor periodically  Diabetes - CBGs controlled, continue with Lantus and SSI  Hypothyroidism - Continue levothyroxine  Hypertension - Continue with Lasix and Cardura  Disposition: Remain inpatient-suspect home on 3/24  DVT Prophylaxis: Not needed as on Coumadin  Code Status: Full code  Family Communication Daughter at bedside  Procedures:  None  CONSULTS:  None  MEDICATIONS: Scheduled Meds: . doxazosin  8 mg Oral QHS  . escitalopram  20 mg Oral q  morning - 10a  . famotidine  20 mg Oral QHS  . furosemide  40 mg Oral q morning - 10a  . insulin aspart  0-15 Units Subcutaneous 6 times per day  . insulin glargine  40 Units Subcutaneous Daily  . levofloxacin (LEVAQUIN) IV  750 mg Intravenous Q24H  . levothyroxine  88 mcg Oral QAC breakfast  . polyethylene glycol  17 g Oral BID  . potassium chloride  20 mEq Oral Daily  . pregabalin  150 mg Oral QHS  . pregabalin  75 mg Oral Daily  . senna  2 tablet Oral QHS  . simvastatin  20 mg Oral QHS  . sodium chloride  10-40 mL Intracatheter Q12H  . vancomycin  1,500 mg Intravenous Q24H  . Warfarin - Pharmacist Dosing Inpatient   Does not apply q1800   Continuous Infusions:  PRN Meds:.acetaminophen, HYDROmorphone (DILAUDID) injection, lidocaine-prilocaine, lidocaine-prilocaine, LORazepam, ondansetron (ZOFRAN) IV, sodium chloride, temazepam  Antibiotics: Anti-infectives   Start     Dose/Rate Route Frequency Ordered Stop   05/12/13 1000  vancomycin (VANCOCIN) 1,500 mg in sodium chloride 0.9 % 500 mL IVPB     1,500 mg 250 mL/hr over 120 Minutes Intravenous Every 24 hours 05/11/13 1017     05/12/13 0400  levofloxacin (LEVAQUIN) IVPB 750 mg     750 mg 100 mL/hr over 90 Minutes Intravenous Every 24 hours 05/11/13 0605     05/11/13 1030  vancomycin (VANCOCIN) 2,000 mg in sodium chloride 0.9 % 500 mL IVPB     2,000 mg 250 mL/hr over 120 Minutes Intravenous NOW 05/11/13 1017 05/11/13 1310   05/11/13 0430  levofloxacin (LEVAQUIN) IVPB 750 mg  750 mg 100 mL/hr over 90 Minutes Intravenous  Once 05/11/13 0419 05/11/13 0551       PHYSICAL EXAM: Vital signs in last 24 hours: Filed Vitals:   05/11/13 0831 05/11/13 1405 05/11/13 2111 05/12/13 0359  BP:  111/53 134/48 134/60  Pulse:  63 69 74  Temp:  97.8 F (36.6 C) 98.2 F (36.8 C) 98.1 F (36.7 C)  TempSrc:  Oral Oral Oral  Resp:  18 16 18   Height: 5' 6.5" (1.689 m)     Weight: 97.7 kg (215 lb 6.2 oz)     SpO2:  99% 100% 100%     Weight change:  Filed Weights   05/11/13 0831  Weight: 97.7 kg (215 lb 6.2 oz)   Body mass index is 34.25 kg/(m^2).   Gen Exam: Awake and alert with clear speech.   Neck: Supple, No JVD.   Chest: B/L Clear.   CVS: S1 S2 Regular, no murmurs.  Abdomen: soft, BS +, non tender, mildly distended with dullness of the flanks Extremities: trace edema, lower extremities warm to touch. Neurologic: Non Focal.   Skin: No Rash.   Wounds: N/A.   Intake/Output from previous day:  Intake/Output Summary (Last 24 hours) at 05/12/13 0943 Last data filed at 05/12/13 0700  Gross per 24 hour  Intake    750 ml  Output    550 ml  Net    200 ml     LAB RESULTS: CBC  Recent Labs Lab 05/11/13 0335 05/11/13 0349 05/12/13 0510  WBC 7.6  --  4.8  HGB 9.9* 11.2* 8.6*  HCT 31.5* 33.0* 29.1*  PLT 108*  --  110*  MCV 80.2  --  82.7  MCH 25.2*  --  24.4*  MCHC 31.4  --  29.6*  RDW 17.1*  --  17.2*    Chemistries   Recent Labs Lab 05/11/13 0335 05/11/13 0349  NA 133* 137  K 4.1 4.1  CL 94* 96  CO2 27  --   GLUCOSE 124* 123*  BUN 10 10  CREATININE 0.88 1.00  CALCIUM 8.9  --     CBG:  Recent Labs Lab 05/11/13 1603 05/11/13 2053 05/12/13 0009 05/12/13 0357 05/12/13 0759  GLUCAP 172* 146* 119* 111* 107*    GFR Estimated Creatinine Clearance: 60.5 ml/min (by C-G formula based on Cr of 1).  Coagulation profile  Recent Labs Lab 05/08/13 1122 05/11/13 0705 05/12/13 0510  INR 2.20 2.21* 2.16*  PROTIME 26.4*  --   --     Cardiac Enzymes No results found for this basename: CK, CKMB, TROPONINI, MYOGLOBIN,  in the last 168 hours  No components found with this basename: POCBNP,  No results found for this basename: DDIMER,  in the last 72 hours No results found for this basename: HGBA1C,  in the last 72 hours No results found for this basename: CHOL, HDL, LDLCALC, TRIG, CHOLHDL, LDLDIRECT,  in the last 72 hours No results found for this basename: TSH, T4TOTAL,  FREET3, T3FREE, THYROIDAB,  in the last 72 hours No results found for this basename: VITAMINB12, FOLATE, FERRITIN, TIBC, IRON, RETICCTPCT,  in the last 72 hours No results found for this basename: LIPASE, AMYLASE,  in the last 72 hours  Urine Studies No results found for this basename: UACOL, UAPR, USPG, UPH, UTP, UGL, UKET, UBIL, UHGB, UNIT, UROB, ULEU, UEPI, UWBC, URBC, UBAC, CAST, CRYS, UCOM, BILUA,  in the last 72 hours  MICROBIOLOGY: Recent Results (from the past 240 hour(s))  CULTURE, BLOOD (ROUTINE X 2)     Status: None   Collection Time    05/11/13  3:35 AM      Result Value Ref Range Status   Specimen Description BLOOD LEFT ARM   Final   Special Requests BOTTLES DRAWN AEROBIC AND ANAEROBIC [facility] EACH   Final   Culture  Setup Time     Final   Value: 05/11/2013 13:42     Performed at [facility]   Culture     Final   Value:        BLOOD CULTURE RECEIVED NO GROWTH TO DATE CULTURE WILL BE HELD FOR 5 DAYS BEFORE ISSUING A FINAL NEGATIVE REPORT     Performed at [facility]   Report Status PENDING   Incomplete  CULTURE, BLOOD (ROUTINE X 2)     Status: None   Collection Time    05/11/13  7:05 AM      Result Value Ref Range Status   Specimen Description BLOOD LEFT HAND   Final   Special Requests BOTTLES DRAWN AEROBIC AND ANAEROBIC 5CC EACH   Final   Culture  Setup Time     Final   Value: 05/11/2013 13:41     Performed at [facility]   Culture     Final   Value:        BLOOD CULTURE RECEIVED NO GROWTH TO DATE CULTURE WILL BE HELD FOR 5 DAYS BEFORE ISSUING A FINAL NEGATIVE REPORT     Performed at [facility]   Report Status PENDING   Incomplete    RADIOLOGY STUDIES/RESULTS: Ct Chest W Contrast  05/11/2013   CLINICAL DATA:  Fever of unknown origin. Current history of pancreatic cancer.  EXAM: CT CHEST WITH CONTRAST  TECHNIQUE: Multidetector CT imaging of the chest was performed during intravenous contrast administration.  CONTRAST:  140mL  OMNIPAQUE IOHEXOL 300 MG/ML IV.  COMPARISON:  CT chest 10/25/2012.  FINDINGS: Interval development of interstitial opacities throughout both lungs since the prior CT. Patchy airspace consolidation deep in the left lower lobe. No localized airspace consolidation elsewhere. No pulmonary parenchymal nodules to suggest metastatic disease. Very small bilateral pleural effusions and associated passive atelectasis posteriorly in the lower lobes.  Interval progression of mediastinal lymphadenopathy since the prior CT. Index subcarinal node measures approximately 1.8 x 2.4 cm (series 2, image 25), previously 1.0 x 1.8 cm. Mildly enlarged right hilar lymph nodes and right intersegmental lymph nodes, also progressive. No axillary lymphadenopathy.  Right subclavian Port-A-Cath tip at the cavoatrial junction. Heart enlarged with left ventricular predominance, unchanged. No pericardial effusion. Moderate atherosclerosis involving the thoracic and upper abdominal aorta.  Small to moderate amount of upper abdominal ascites. Numerous metastases within the visualized liver. Splenomegaly, with interval increase in size of the spleen. Thrombosis involving the main portal vein.  Bone window images demonstrate osseous demineralization and degenerative changes throughout the thoracic spine.  IMPRESSION: 1. Interstitial opacities throughout both lungs. 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 2. Progressive mediastinal and right hilar lymphadenopathy. 3. Small bilateral pleural effusions and associated passive atelectasis in the lower lobes. Patchy pneumonia M deep in the left lower lobe. 4. Upper abdominal ascites. 5. Numerous liver metastases. 6. Portal vein thrombosis. 7. Splenomegaly, with interval increase in the spleen size since the prior CT from September, 2014.   Electronically Signed   By: Evangeline Dakin M.D.   On: 05/11/2013 07:07   Ct  Abdomen Pelvis W Contrast  04/16/2013   CLINICAL DATA:  Pancreatic cancer diagnosed in August 2014. Chemotherapy complete. Abdominal pain.  EXAM: CT ABDOMEN AND PELVIS WITH CONTRAST  TECHNIQUE: Multidetector CT imaging of the abdomen and pelvis was performed using the standard protocol following bolus administration of intravenous contrast.  CONTRAST:  125mL OMNIPAQUE IOHEXOL 300 MG/ML  SOLN  COMPARISON:  CT of the abdomen 02/10/2013.  FINDINGS: Lung Bases: 4 mm subpleural nodule along the lateral aspect of the right major fissure (image 2 of series 9) is unchanged and nonspecific. 5 mm subpleural nodule in the inferior segment of the lingula (image 9 of series 9) is new compared to the prior study, but nonspecific. Small right and trace left pleural effusions appear to be new compared to the prior examination.  Abdomen/Pelvis: Again noted is a complex lesion involving predominantly the inferior aspect of the pancreatic head, which shows evidence of direct invasion into the superior mesenteric vein and portal vein, completely occluding the splenoportal confluence, now extending to the bifurcation of the portal vein, into the proximal right main portal vein, and into the more distal aspects of the left main portal vein and branches in the left hepatic lobe. The largest portion of this tumor appears to be tumor thrombus, which is irregular in shape and best measured on coronal and sagittal reformats where this portion of the tumor is estimated to measure approximately 5.6 x 2.4 x 2.2 cm. This tumor thrombus appears intimately associated with the undersurface of the common hepatic artery, without an intervening fat plane, which may suggest early invasion. This lesion appears separate from the superior mesenteric artery at this time. In the inferior aspect of the pancreatic head (image 68 of series 3) there is a separate hypovascular focus of tumor measuring 1.6 x 1.5 cm (unchanged). Well-defined 1 cm low-attenuation  lesion in the mid body of the pancreas is unchanged compared to the most recent prior study.  Associated with increasing tumor thrombus and greater involvement of the portal veins there is progressively increased cavernous transformation in the porta hepatis. This is associated with heterogeneous hepatic perfusion. There appear to be multiple new ill-defined hypovascular areas within the liver, several of which are concerning for potential metastasis, largest of which is in the inferior aspect of segment 6 of the liver (image 41 of series 5) measuring approximately 4.1 x 2.5 cm. Innumerable small peripancreatic lymph nodes are conspicuous in number rather than size, and there is diffuse peripancreatic stranding with extension of inflammatory/edematous changes into the surrounding retroperitoneal tissues. Trace volume of ascites, most pronounced in the pericolic gutters bilaterally.  Numerous high attenuation foci lying dependently in the lumen of the gallbladder, compatible with small gallstones. No current findings of acute cholecystitis at this time. Spleen is enlarged measuring 16.9 x 9.9 x 14.1 cm. Multiple small low-attenuation renal lesions bilaterally, several of which are too small to definitively characterize, but the largest lesion measures 2.9 x 2.5 cm in the lateral aspect of the interpolar region of the left kidney, compatible with a small simple cyst. Small volume of ascites tracking into the pelvis. No pneumoperitoneum. No pathologic distention of small bowel.  Musculoskeletal: There are no aggressive appearing lytic or blastic lesions noted in the visualized portions of the skeleton.  IMPRESSION: 1. Today's study demonstrates progression of disease with further expansion of tumor thrombus which now extends from the SMV through splenoportal confluence into the portal venous system involving the main portal vein, proximal right main portal vein, and  extensive involvement of the left portal venous  system, with increasing cavernous transformation in the porta hepatis and worsening splenomegaly. In addition, there are several new hypovascular areas in the liver concerning for metastatic disease. These could be confirmed with MRI of the abdomen with and without IV gadolinium if clinically appropriate. 2. Slight interval increased in ascites, and development of new bilateral pleural effusions (small on the right and trace on the left), with increasing soft tissue stranding throughout the retroperitoneum adjacent to the pancreas, as well as increasing number of numerous small peripancreatic and retroperitoneal lymph nodes. 3. Additional incidental findings, as above.   Electronically Signed   By: Vinnie Langton M.D.   On: 04/16/2013 09:25   Mr Liver W Wo Contrast  04/21/2013   CLINICAL DATA:  Pancreatic cancer diagnosed 8/14. Finished chemotherapy and radiation therapy. Nausea and abdominal pain. Evaluate for liver metastasis.  EXAM: MRI ABDOMEN WITHOUT AND WITH CONTRAST  TECHNIQUE: Multiplanar, multisequence MR imaging was performed both before and after administration of intravenous contrast.  CONTRAST:  9cc Eovist, a mixed extracellular and hepatobiliary phase agent  COMPARISON:  CT ABD/PELVIS W CM dated 04/16/2013; CT ABD/PELVIS W CM dated 02/10/2013; CT ABD/PELVIS W CM dated 10/01/2012; CT ANGIO ABDOMEN W/CM &/OR WO/CM dated 10/03/2012  FINDINGS: Small right greater the left bilateral pleural effusions which are not significantly changed. Small volume upper abdominal ascites is also similar.  Hepatomegaly and splenomegaly. Liver 19.6 cm craniocaudal. The spleen measures 14.8 cm craniocaudal.  Heterogeneous hepatic steatosis, including within the posterior right hepatic lobe on image 19/series 8 and image 35/ series 8. Concurrent extensive bilateral hepatic metastasis. Example lesions within the anterior segment right liver lobe at 1.6 cm on image 16/ series 5. Medial segment left liver lobe on image 16/  series 5 x 1.3 cm. These areas demonstrate restricted diffusion, including on image 59/series 6.  Normal stomach.  Cystic pancreatic neck and head lesions are similar to on the recent CT.  Thrombus within the superior mesenteric vein, splenoportal confluence, main portal vein, a left greater than right portal veins is similar to on the recent CT.  Normal adrenal glands. Bilateral renal cysts. Mild peripancreatic edema which could be treatment related. Small peripancreatic nodes which are indeterminate.  IMPRESSION: 1. The appearance of the liver on CT is secondary to primarily extensive metastasis. There is concurrent heterogeneous hepatic steatosis. 2. Extensive thrombus within the portal vein, superior mesenteric vein, and branches, similar to on the prior CT. 3. Hepatosplenomegaly and small volume upper abdominal ascites, as before. 4. Similar small bilateral pleural effusions.   Electronically Signed   By: Abigail Miyamoto M.D.   On: 04/21/2013 12:24   Dg Chest Portable 1 View  05/11/2013   CLINICAL DATA:  Weakness, cough, shortness of breath  EXAM: PORTABLE CHEST - 1 VIEW  COMPARISON:  Prior radiograph from 01/14/2013  FINDINGS: Cardiomegaly is stable as compared to prior study. Left-sided Port-A-Cath noted.  Lungs are hypoinflated. There is diffuse pulmonary vascular congestion without overt pulmonary edema. Bibasilar patchy opacities may reflect atelectasis or possibly infiltrates. No definite pleural effusion. No pneumothorax.  No acute osseus abnormality. Severe degenerative changes noted about the [facility] joints bilaterally.  IMPRESSION: 1. Stable cardiomegaly with mild diffuse pulmonary vascular congestion without overt pulmonary edema. 2. Shallow lung inflation with patchy bibasilar opacities, likely atelectasis. Possible infiltrate within the left lower lobe could be considered in the correct clinical setting.   Electronically Signed   By: Jeannine Boga M.D.   On: 05/11/2013  05:30     Oren Binet, MD  [facility] Pager:[phone]  If 7PM-7AM, please contact night-coverage [email or web address] Password [facility] 05/12/2013, 9:43 AM   LOS: 1 day

## 2013-05-12 NOTE — Progress Notes (Signed)
CARE MANAGEMENT NOTE 05/12/2013  Patient:  Theresa Summers, Theresa Summers   Account Number:  [id number]  Date Initiated:  05/12/2013  Documentation initiated by:  [facility]  Subjective/Objective Assessment:   73 Y/O F ADMITTED W/FEVER.     Action/Plan:   FROM HOME W/SPOUSE.   Anticipated DC Date:  05/13/2013   Anticipated DC Plan:  [place]  CM consult      Choice offered to / List presented to:  C-1 Patient        [place] arranged  HH-1 RN      [place]   Status of service:  In process, will continue to follow Medicare Important Message given?   (If response is "NO", the following Medicare IM given date fields will be blank) Date Medicare IM given:   Date Additional Medicare IM given:    Discharge Disposition:    Per UR Regulation:  Reviewed for med. necessity/level of care/duration of stay  If discussed at Long Length of Stay Meetings, dates discussed:    Comments:  05/12/13 Aldean Suddeth RN,BSN NCM [place] CHOICE-[facility] CHOSEN BY PATIENT/DTR.TC MARGIE [facility]-FOLLOWING.NOTED ON 02.

## 2013-05-12 NOTE — Progress Notes (Signed)
Notified by  [facility], patient and family request services of [facility]) after discharge.  Spoke with pt, pt's daughter Santiago Glad and husband Abbe Amsterdam at bedside to initiate education related to hospice services, philosophy and team approach to care, all voiced understanding of information reviewed. Per family plan will be to transport home via personal vehicle if pt will be strong enough.  During discussion daughter and patient voiced they are concerned about pt 'discharging too soon' before pain is under control; per notes pt to started on 50 mcg Duragesic patch today and is still requiring PRN pain medication for BTP  DME needs discussed- requests 1) Complete Pkg D: fully electric hospital bed with half rails; AP&P mattress pad, overbed table please add a walker with wheels, 3n1 BSC and shower chair   2)currently pt on 2LNC O2- given some SOB, Wheezing would also request O2 in the home at d/c for comfort if MD agrees  Dr Benay Spice is attending physician following with [facility] at discharge; [facility]acy: [facility] Initial paperwork [facility] Please notify [facility] when patient is ready to leave unit at d/c call [phone] (or [phone] if after 5 pm);  [facility] information and contact numbers also given to daughter Santiago Glad during visit.   Above information shared with [facility] Please call with any questions or concerns   Danton Sewer, RN 05/12/2013, 3:54 PM [facility] [place] RN Liaison [phone]

## 2013-05-12 NOTE — Progress Notes (Signed)
Nutrition Brief Note  Patient identified on the Malnutrition Screening Tool (MST) Report  Wt Readings from Last 15 Encounters:  05/11/13 215 lb 6.2 oz (97.7 kg)  04/14/13 226 lb 6.4 oz (102.694 kg)  04/10/13 221 lb 12 oz (100.585 kg)  04/03/13 221 lb 4.8 oz (100.381 kg)  03/25/13 220 lb (99.791 kg)  03/14/13 216 lb 3.2 oz (98.068 kg)  02/27/13 221 lb 4.8 oz (100.381 kg)  02/19/13 222 lb 4.8 oz (100.835 kg)  02/12/13 219 lb 9.6 oz (99.61 kg)  01/31/13 213 lb 9.6 oz (96.888 kg)  01/30/13 216 lb 4 oz (98.09 kg)  01/22/13 221 lb 4.8 oz (100.381 kg)  01/15/13 223 lb 4.8 oz (101.288 kg)  01/08/13 227 lb 8 oz (103.193 kg)  01/02/13 225 lb 4.8 oz (102.195 kg)    Body mass index is 34.25 kg/(m^2). Patient meets criteria for Obesity I based on current BMI.   Current diet order is Regular, patient is consuming approximately 50% of meals at this time. Labs and medications reviewed.   Pt and pt's family reported poor PO intake pta, but this has been improving during admission. Pt was eating one meal/day, and with either experiencing nausea or sleeping through meal periods. Family has been encouraging PO intake. Declined addition nutrition interventions, plan on continuing to promote appetite via food sources. Weight has ranged from 215-226 lbs in past 5 months. Recommend to continue with liberalized diet, plan to d/c to hospice either Tuesday or Wednesday  No nutrition interventions warranted at this time. If nutrition issues arise, please consult RD.   Atlee Abide MS RD LDN Clinical Dietitian [phone]

## 2013-05-12 NOTE — Progress Notes (Signed)
[name] is providing the following services: hospice bed pkg d, commode, tub seat w/ back, oxygen 2 lpm prn.  If patient discharges after hours, please call [phone].   Theresa Summers 05/12/2013, 4:14 PM

## 2013-05-12 NOTE — Telephone Encounter (Signed)
Notified Margie with inpatient Hospice that Dr. Benay Spice has cancelled her appointment on 3/25 since she will be seen prior to D/C tomorrow. Office will call with new appointment. She will let family know. Family is requesting Dr. Benay Spice convince hospitalist to let her stay till Wednesday, since they changed her pain medications-just started Fentanyl patch and they want to be sure it is working before she leaves. Will forward request to Dr. Benay Spice.

## 2013-05-12 NOTE — Progress Notes (Signed)
ANTICOAGULATION CONSULT NOTE - Follow up  Pharmacy Consult for Warfarin Indication: portal vein thrombosis  Allergies  Allergen Reactions  . Oxycodone Hcl Nausea Only  . Latex Rash  . Zithromax [Azithromycin] Other (See Comments)    thrush    Patient Measurements: Height: 5' 6.5" (168.9 cm) Weight: 215 lb 6.2 oz (97.7 kg) IBW/kg (Calculated) : 60.45  Vital Signs: Temp: 98.1 F (36.7 C) (03/23 0359) Temp src: Oral (03/23 0359) BP: 134/60 mmHg (03/23 0359) Pulse Rate: 74 (03/23 0359)  Labs:  Recent Labs  05/11/13 0335 05/11/13 0349 05/11/13 0705 05/12/13 0510  HGB 9.9* 11.2*  --  8.6*  HCT 31.5* 33.0*  --  29.1*  PLT 108*  --   --  110*  LABPROT  --   --  23.8* 23.4*  INR  --   --  2.21* 2.16*  CREATININE 0.88 1.00  --   --    Estimated Creatinine Clearance: 60.5 ml/min (by C-G formula based on Cr of 1).  Assessment: 73yo F admitted on 3/22 with fever and AMS.  PMH is significant for pancreatic cancer, ascites, and chronic anticoagulation with warfarin for history of portal vein thrombosis.  Warfarin home dose is 5mg  daily except 7.5mg  on Tuesday, Thursdays, and Saturdays.  INR remains therapeutic.  CBC: Hgb down. Low pltc, about the same. No bleeding reported/documented.  Drug-drug interaction with Levaquin (increases warfarin anticoagulant effect)  Holding-off on paracentesis for now.  Goal of Therapy:  INR 2-3 Monitor platelets by anticoagulation protocol: Yes   Plan:   Warfarin 6mg  tonight at 1800.  Daily INR.  Romeo Rabon, PharmD, pager [phone]. 05/12/2013,12:03 PM.    Gretta Arab PharmD, BCPS Pager [phone] 05/12/2013 11:58 AM

## 2013-05-12 NOTE — Telephone Encounter (Signed)
Needed to confirm that at discharge Dr. Benay Spice will be her attending and OK for Hospice MD to assist with symptom management prn. Gave approval for both requests per Dr. Benay Spice.

## 2013-05-13 DIAGNOSIS — R0989 Other specified symptoms and signs involving the circulatory and respiratory systems: Secondary | ICD-10-CM

## 2013-05-13 DIAGNOSIS — R0609 Other forms of dyspnea: Secondary | ICD-10-CM

## 2013-05-13 LAB — GLUCOSE, CAPILLARY
GLUCOSE-CAPILLARY: 109 mg/dL — AB (ref 70–99)
GLUCOSE-CAPILLARY: 129 mg/dL — AB (ref 70–99)
GLUCOSE-CAPILLARY: 130 mg/dL — AB (ref 70–99)
Glucose-Capillary: 99 mg/dL (ref 70–99)

## 2013-05-13 LAB — PROTIME-INR
INR: 1.75 — AB (ref 0.00–1.49)
Prothrombin Time: 19.9 seconds — ABNORMAL HIGH (ref 11.6–15.2)

## 2013-05-13 MED ORDER — HYDROMORPHONE HCL 1 MG/ML PO LIQD: 2.0000 mg | ORAL | Status: DC | PRN

## 2013-05-13 MED ORDER — HEPARIN SOD (PORK) LOCK FLUSH 100 UNIT/ML IV SOLN: 500.0000 [IU] | INTRAVENOUS | Status: DC | PRN

## 2013-05-13 MED ORDER — ENSURE COMPLETE PO LIQD: 237.0000 mL | Freq: Two times a day (BID) | ORAL | Status: AC | PRN

## 2013-05-13 MED ORDER — CLONAZEPAM 0.1 MG/ML ORAL SUSPENSION: 0.5000 mg | Freq: Two times a day (BID) | ORAL | Status: DC | PRN

## 2013-05-13 MED ORDER — POLYETHYLENE GLYCOL 3350 17 G PO PACK: 17.0000 g | PACK | Freq: Two times a day (BID) | ORAL | Status: AC

## 2013-05-13 MED ORDER — CLONAZEPAM 0.5 MG PO TBDP
0.5000 mg | ORAL_TABLET | Freq: Two times a day (BID) | ORAL | Status: DC | PRN
  Administered 2013-05-13: 0.5 mg via ORAL
  Filled 2013-05-13: qty 1

## 2013-05-13 MED ORDER — FENTANYL 50 MCG/HR TD PT72: 50.0000 ug | MEDICATED_PATCH | TRANSDERMAL | Status: DC

## 2013-05-13 MED ORDER — HEPARIN SOD (PORK) LOCK FLUSH 100 UNIT/ML IV SOLN
500.0000 [IU] | INTRAVENOUS | Status: AC | PRN
  Administered 2013-05-13: 500 [IU]

## 2013-05-13 MED ORDER — LEVOFLOXACIN 750 MG PO TABS: 750.0000 mg | ORAL_TABLET | Freq: Every day | ORAL | Status: DC

## 2013-05-13 MED ORDER — INSULIN GLARGINE 100 UNIT/ML SOLOSTAR PEN: 40.0000 [IU] | PEN_INJECTOR | Freq: Every morning | SUBCUTANEOUS | Status: DC

## 2013-05-13 MED ORDER — SENNA 8.6 MG PO TABS: 2.0000 | ORAL_TABLET | Freq: Every day | ORAL | Status: AC

## 2013-05-13 MED ORDER — WARFARIN SODIUM 4 MG PO TABS
8.0000 mg | ORAL_TABLET | Freq: Once | ORAL | Status: AC
  Administered 2013-05-13: 8 mg via ORAL
  Filled 2013-05-13: qty 2

## 2013-05-13 MED ORDER — CLONAZEPAM 0.1 MG/ML ORAL SUSPENSION: 0.5000 mg | Freq: Three times a day (TID) | ORAL | Status: DC | PRN

## 2013-05-13 NOTE — Progress Notes (Signed)
ANTICOAGULATION CONSULT NOTE - Follow up  Pharmacy Consult for Warfarin Indication: portal vein thrombosis  Allergies  Allergen Reactions  . Oxycodone Hcl Nausea Only  . Latex Rash  . Zithromax [Azithromycin] Other (See Comments)    thrush    Patient Measurements: Height: 5' 6.5" (168.9 cm) Weight: 215 lb 6.2 oz (97.7 kg) IBW/kg (Calculated) : 60.45  Vital Signs: Temp: 98.8 F (37.1 C) (03/24 0404) Temp src: Oral (03/24 0404) BP: 130/60 mmHg (03/24 0404) Pulse Rate: 69 (03/24 0404)  Labs:  Recent Labs  05/11/13 0335 05/11/13 0349 05/11/13 0705 05/12/13 0510 05/13/13 0420  HGB 9.9* 11.2*  --  8.6*  --   HCT 31.5* 33.0*  --  29.1*  --   PLT 108*  --   --  110*  --   LABPROT  --   --  23.8* 23.4* 19.9*  INR  --   --  2.21* 2.16* 1.75*  CREATININE 0.88 1.00  --   --   --    Estimated Creatinine Clearance: 60.5 ml/min (by C-G formula based on Cr of 1).  Assessment: 73yo F admitted on 3/22 with fever and AMS.  PMH is significant for pancreatic cancer, ascites, and chronic anticoagulation with warfarin for history of portal vein thrombosis diagnosed August 2014.  Warfarin home dose is 5mg  daily except 7.5mg  on Tuesday, Thursdays, and Saturdays.  INR fell overnight to subtherapeutic after 4mg  and 6mg . Still could rise from dose yesterday and recent start of Levaquin.  CBC: Hgb down. Low pltc, about the same. No bleeding reported/documented.  Drug-drug interaction with Levaquin (increases warfarin anticoagulant effect)  Holding-off on paracentesis for now.  Goal of Therapy:  INR 2-3 Monitor platelets by anticoagulation protocol: Yes   Plan:   Give slightly larger Coumadin 8mg  today. Give early at 51.  Daily INR.  MD: If indicated, consider adding Lovenox 100mg  SQ q12h while INR <2.   Romeo Rabon, PharmD, pager [phone]. 05/13/2013,8:32 AM.

## 2013-05-13 NOTE — Progress Notes (Signed)
Courtesy visit made to patient on behalf of [employer] program for [employer] employees with [employer]. She has been active with the [employer] program for DM management with the Pharmacist. Mrs Rahrig is known to Probation officer from previous hospital visits and post discharge calls. Offered support and encouragement at bedside. Appreciative of visit.  Marthenia Rolling, MSN- [facility] [phone]

## 2013-05-13 NOTE — Progress Notes (Signed)
No changes in initial PM assessment at this time. Pt resting quietly in bed with eyes closed. No complaints at this time. Continue with plan of care 

## 2013-05-13 NOTE — Discharge Summary (Signed)
PATIENT DETAILS Name: Theresa Summers Age: 73 y.o. Sex: female Date of Birth: 07-21-1940 MRN: [id number]. Admit Date: 05/11/2013 Admitting Physician: Etta Quill, DO HX:[id number] Theresa Medina, MD  Recommendations for Outpatient Follow-up:  1. Being discharged with Home Hospice-will slowly need to transition to full comfort care status. Medications will need to be minimized/Discontinued. 2. Optimize narcotic regimen  PRIMARY DISCHARGE DIAGNOSIS:  Principal Problem:   Fever Active Problems:   Portal vein thrombosis   Malignant neoplasm of head of pancreas   Ascites, malignant   DM2 (diabetes mellitus, type 2)      PAST MEDICAL HISTORY: Past Medical History  Diagnosis Date  . Trigger finger   . Hyperlipidemia   . Diabetes mellitus   . Chronic venous insufficiency   . Morbid obesity   . Hypertension   . Thyroid disease   . GERD (gastroesophageal reflux disease)   . Hypothyroidism   . Arthritis   . Anemia   . Common bile duct dilatation 10/01/12  . Fatty liver 10/01/12  . Portal vein thrombosis   . Cholelithiasis   . Pancreatic lesion 10/01/12  . Hydroureteronephrosis     right  . Pancreatic mass 2014  . Cancer 10/17/12     pancreatic   . Allergy   . Anxiety   . Neuromuscular disorder     neuropathy    DISCHARGE MEDICATIONS:   Medication List    STOP taking these medications       LORazepam 0.5 MG tablet  Commonly known as:  ATIVAN      TAKE these medications       acetaminophen 325 MG tablet  Commonly known as:  TYLENOL  Take 650 mg by mouth daily as needed for pain. For pain     Biotin 5000 MCG Caps  Take 5,000 mcg by mouth at bedtime.     clonazePAM 0.1 mg/mL Susp  Commonly known as:  KLONOPIN  Take 5 mLs (0.5 mg total) by mouth 3 (three) times daily as needed (anxiety).     doxazosin 8 MG tablet  Commonly known as:  CARDURA  Take 8 mg by mouth at bedtime.     escitalopram 20 MG tablet  Commonly known as:  LEXAPRO  Take 20 mg by mouth every morning.      famotidine 20 MG tablet  Commonly known as:  PEPCID  Take 20 mg by mouth at bedtime.     feeding supplement (ENSURE COMPLETE) Liqd  Take 237 mLs by mouth 2 (two) times daily between meals as needed (Allow pt to order as needed, eating 50% of meals).     fentaNYL 50 MCG/HR  Commonly known as:  DURAGESIC - dosed mcg/hr  Place 1 patch (50 mcg total) onto the skin every 3 (three) days.     furosemide 40 MG tablet  Commonly known as:  LASIX  Take 40 mg by mouth every morning.     HYDROmorphone HCl 1 MG/ML Liqd  Commonly known as:  DILAUDID  Take 2-3 mLs (2-3 mg total) by mouth every 4 (four) hours as needed for severe pain.     Insulin Glargine 100 UNIT/ML Solostar Pen  Commonly known as:  LANTUS SOLOSTAR  Inject 40 Units into the skin every morning.     levofloxacin 750 MG tablet  Commonly known as:  LEVAQUIN  Take 1 tablet (750 mg total) by mouth daily.     levothyroxine 88 MCG tablet  Commonly known as:  SYNTHROID, LEVOTHROID  Take 88 mcg  by mouth daily before breakfast. Name brand only     lidocaine-prilocaine cream  Commonly known as:  EMLA  Apply 1 application topically as needed (topical numbing).     ondansetron 8 MG disintegrating tablet  Commonly known as:  ZOFRAN-ODT  Take 1 tablet (8 mg total) by mouth every 8 (eight) hours as needed for nausea or vomiting.     polyethylene glycol packet  Commonly known as:  MIRALAX / GLYCOLAX  Take 17 g by mouth 2 (two) times daily.     potassium chloride 20 MEQ packet  Commonly known as:  KLOR-CON  Take 20 mEq by mouth every morning.     pregabalin 75 MG capsule  Commonly known as:  LYRICA  Take 75-150 mg by mouth 2 (two) times daily. 75mg  in the morning and 150mg  at bedtime     senna 8.6 MG Tabs tablet  Commonly known as:  SENOKOT  Take 2 tablets (17.2 mg total) by mouth at bedtime.     simvastatin 20 MG tablet  Commonly known as:  ZOCOR  Take 20 mg by mouth at bedtime.     temazepam 30 MG capsule  Commonly  known as:  RESTORIL  Take 30 mg by mouth at bedtime as needed for sleep.     traMADol 50 MG tablet  Commonly known as:  ULTRAM  Take 50 mg by mouth every 6 (six) hours as needed for moderate pain.     warfarin 5 MG tablet  Commonly known as:  COUMADIN  Take 5-7.5 mg by mouth every evening. Take 7. 5mg  every day except take 5mg  Sunday Monday, Wednesday, and Friday.        ALLERGIES:   Allergies  Allergen Reactions  . Oxycodone Hcl Nausea Only  . Latex Rash  . Zithromax [Azithromycin] Other (See Comments)    thrush    BRIEF HPI:  See H&P, Labs, Consult and Test reports for all details in brief, patient was admitted for evaluation of fever, patient with a hx of stage 4 pancreatic cancer.  CONSULTATIONS:   hematology/oncology  PERTINENT RADIOLOGIC STUDIES: Ct Chest W Contrast  05/11/2013   CLINICAL DATA:  Fever of unknown origin. Current history of pancreatic cancer.  EXAM: CT CHEST WITH CONTRAST  TECHNIQUE: Multidetector CT imaging of the chest was performed during intravenous contrast administration.  CONTRAST:  116mL OMNIPAQUE IOHEXOL 300 MG/ML IV.  COMPARISON:  CT chest 10/25/2012.  FINDINGS: Interval development of interstitial opacities throughout both lungs since the prior CT. Patchy airspace consolidation deep in the left lower lobe. No localized airspace consolidation elsewhere. No pulmonary parenchymal nodules to suggest metastatic disease. Very small bilateral pleural effusions and associated passive atelectasis posteriorly in the lower lobes.  Interval progression of mediastinal lymphadenopathy since the prior CT. Index subcarinal node measures approximately 1.8 x 2.4 cm (series 2, image 25), previously 1.0 x 1.8 cm. Mildly enlarged right hilar lymph nodes and right intersegmental lymph nodes, also progressive. No axillary lymphadenopathy.  Right subclavian Port-A-Cath tip at the cavoatrial junction. Heart enlarged with left ventricular predominance, unchanged. No pericardial  effusion. Moderate atherosclerosis involving the thoracic and upper abdominal aorta.  Small to moderate amount of upper abdominal ascites. Numerous metastases within the visualized liver. Splenomegaly, with interval increase in size of the spleen. Thrombosis involving the main portal vein.  Bone window images demonstrate osseous demineralization and degenerative changes throughout the thoracic spine.  IMPRESSION: 1. Interstitial opacities throughout both lungs. 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 2. Progressive mediastinal and right hilar lymphadenopathy. 3. Small bilateral pleural effusions and associated passive atelectasis in the lower lobes. Patchy pneumonia M deep in the left lower lobe. 4. Upper abdominal ascites. 5. Numerous liver metastases. 6. Portal vein thrombosis. 7. Splenomegaly, with interval increase in the spleen size since the prior CT from September, 2014.   Electronically Signed   By: Evangeline Dakin M.D.   On: 05/11/2013 07:07   Ct Abdomen Pelvis W Contrast  04/16/2013   CLINICAL DATA:  Pancreatic cancer diagnosed in August 2014. Chemotherapy complete. Abdominal pain.  EXAM: CT ABDOMEN AND PELVIS WITH CONTRAST  TECHNIQUE: Multidetector CT imaging of the abdomen and pelvis was performed using the standard protocol following bolus administration of intravenous contrast.  CONTRAST:  19mL OMNIPAQUE IOHEXOL 300 MG/ML  SOLN  COMPARISON:  CT of the abdomen 02/10/2013.  FINDINGS: Lung Bases: 4 mm subpleural nodule along the lateral aspect of the right major fissure (image 2 of series 9) is unchanged and nonspecific. 5 mm subpleural nodule in the inferior segment of the lingula (image 9 of series 9) is new compared to the prior study, but nonspecific. Small right and trace left pleural effusions appear to be new compared to the prior examination.  Abdomen/Pelvis: Again noted is a complex lesion involving  predominantly the inferior aspect of the pancreatic head, which shows evidence of direct invasion into the superior mesenteric vein and portal vein, completely occluding the splenoportal confluence, now extending to the bifurcation of the portal vein, into the proximal right main portal vein, and into the more distal aspects of the left main portal vein and branches in the left hepatic lobe. The largest portion of this tumor appears to be tumor thrombus, which is irregular in shape and best measured on coronal and sagittal reformats where this portion of the tumor is estimated to measure approximately 5.6 x 2.4 x 2.2 cm. This tumor thrombus appears intimately associated with the undersurface of the common hepatic artery, without an intervening fat plane, which may suggest early invasion. This lesion appears separate from the superior mesenteric artery at this time. In the inferior aspect of the pancreatic head (image 68 of series 3) there is a separate hypovascular focus of tumor measuring 1.6 x 1.5 cm (unchanged). Well-defined 1 cm low-attenuation lesion in the mid body of the pancreas is unchanged compared to the most recent prior study.  Associated with increasing tumor thrombus and greater involvement of the portal veins there is progressively increased cavernous transformation in the porta hepatis. This is associated with heterogeneous hepatic perfusion. There appear to be multiple new ill-defined hypovascular areas within the liver, several of which are concerning for potential metastasis, largest of which is in the inferior aspect of segment 6 of the liver (image 41 of series 5) measuring approximately 4.1 x 2.5 cm. Innumerable small peripancreatic lymph nodes are conspicuous in number rather than size, and there is diffuse peripancreatic stranding with extension of inflammatory/edematous changes into the surrounding retroperitoneal tissues. Trace volume of ascites, most pronounced in the pericolic gutters  bilaterally.  Numerous high attenuation foci lying dependently in the lumen of the gallbladder, compatible with small gallstones. No current findings of acute cholecystitis at this time. Spleen is enlarged measuring 16.9 x 9.9 x 14.1 cm. Multiple small low-attenuation renal lesions bilaterally, several of which are too small to definitively characterize, but the largest lesion measures 2.9 x 2.5 cm in the lateral aspect of the interpolar region of the left  kidney, compatible with a small simple cyst. Small volume of ascites tracking into the pelvis. No pneumoperitoneum. No pathologic distention of small bowel.  Musculoskeletal: There are no aggressive appearing lytic or blastic lesions noted in the visualized portions of the skeleton.  IMPRESSION: 1. Today's study demonstrates progression of disease with further expansion of tumor thrombus which now extends from the SMV through splenoportal confluence into the portal venous system involving the main portal vein, proximal right main portal vein, and extensive involvement of the left portal venous system, with increasing cavernous transformation in the porta hepatis and worsening splenomegaly. In addition, there are several new hypovascular areas in the liver concerning for metastatic disease. These could be confirmed with MRI of the abdomen with and without IV gadolinium if clinically appropriate. 2. Slight interval increased in ascites, and development of new bilateral pleural effusions (small on the right and trace on the left), with increasing soft tissue stranding throughout the retroperitoneum adjacent to the pancreas, as well as increasing number of numerous small peripancreatic and retroperitoneal lymph nodes. 3. Additional incidental findings, as above.   Electronically Signed   By: Vinnie Langton M.D.   On: 04/16/2013 09:25   Mr Liver W Wo Contrast  04/21/2013   CLINICAL DATA:  Pancreatic cancer diagnosed 8/14. Finished chemotherapy and radiation  therapy. Nausea and abdominal pain. Evaluate for liver metastasis.  EXAM: MRI ABDOMEN WITHOUT AND WITH CONTRAST  TECHNIQUE: Multiplanar, multisequence MR imaging was performed both before and after administration of intravenous contrast.  CONTRAST:  9cc Eovist, a mixed extracellular and hepatobiliary phase agent  COMPARISON:  CT ABD/PELVIS W CM dated 04/16/2013; CT ABD/PELVIS W CM dated 02/10/2013; CT ABD/PELVIS W CM dated 10/01/2012; CT ANGIO ABDOMEN W/CM &/OR WO/CM dated 10/03/2012  FINDINGS: Small right greater the left bilateral pleural effusions which are not significantly changed. Small volume upper abdominal ascites is also similar.  Hepatomegaly and splenomegaly. Liver 19.6 cm craniocaudal. The spleen measures 14.8 cm craniocaudal.  Heterogeneous hepatic steatosis, including within the posterior right hepatic lobe on image 19/series 8 and image 35/ series 8. Concurrent extensive bilateral hepatic metastasis. Example lesions within the anterior segment right liver lobe at 1.6 cm on image 16/ series 5. Medial segment left liver lobe on image 16/ series 5 x 1.3 cm. These areas demonstrate restricted diffusion, including on image 59/series 6.  Normal stomach.  Cystic pancreatic neck and head lesions are similar to on the recent CT.  Thrombus within the superior mesenteric vein, splenoportal confluence, main portal vein, a left greater than right portal veins is similar to on the recent CT.  Normal adrenal glands. Bilateral renal cysts. Mild peripancreatic edema which could be treatment related. Small peripancreatic nodes which are indeterminate.  IMPRESSION: 1. The appearance of the liver on CT is secondary to primarily extensive metastasis. There is concurrent heterogeneous hepatic steatosis. 2. Extensive thrombus within the portal vein, superior mesenteric vein, and branches, similar to on the prior CT. 3. Hepatosplenomegaly and small volume upper abdominal ascites, as before. 4. Similar small bilateral pleural  effusions.   Electronically Signed   By: Abigail Miyamoto M.D.   On: 04/21/2013 12:24   Dg Chest Portable 1 View  05/11/2013   CLINICAL DATA:  Weakness, cough, shortness of breath  EXAM: PORTABLE CHEST - 1 VIEW  COMPARISON:  Prior radiograph from 01/14/2013  FINDINGS: Cardiomegaly is stable as compared to prior study. Left-sided Port-A-Cath noted.  Lungs are hypoinflated. There is diffuse pulmonary vascular congestion without overt pulmonary edema. Bibasilar patchy  opacities may reflect atelectasis or possibly infiltrates. No definite pleural effusion. No pneumothorax.  No acute osseus abnormality. Severe degenerative changes noted about the [facility] joints bilaterally.  IMPRESSION: 1. Stable cardiomegaly with mild diffuse pulmonary vascular congestion without overt pulmonary edema. 2. Shallow lung inflation with patchy bibasilar opacities, likely atelectasis. Possible infiltrate within the left lower lobe could be considered in the correct clinical setting.   Electronically Signed   By: Jeannine Boga M.D.   On: 05/11/2013 05:30   Dg Abd 2 Views  05/12/2013   CLINICAL DATA:  Abdominal pain.  Pancreatic cancer  EXAM: ABDOMEN - 2 VIEW  COMPARISON:  CT abdomen 04/16/2013  FINDINGS: The bowel gas pattern is normal. There is no evidence of free air. No radio-opaque calculi or other significant radiographic abnormality is seen. Contrast is noted in the renal collecting system from preceding chest CT.  IMPRESSION: Negative.   Electronically Signed   By: Franchot Gallo M.D.   On: 05/12/2013 07:09     PERTINENT LAB RESULTS: CBC:  Recent Labs  05/11/13 0335 05/11/13 0349 05/12/13 0510  WBC 7.6  --  4.8  HGB 9.9* 11.2* 8.6*  HCT 31.5* 33.0* 29.1*  PLT 108*  --  110*   CMET CMP     Component Value Date/Time   NA 137 05/11/2013 0349   NA 140 05/01/2013 1221   K 4.1 05/11/2013 0349   K 3.6 05/01/2013 1221   CL 96 05/11/2013 0349   CO2 27 05/11/2013 0335   CO2 27 05/01/2013 1221   GLUCOSE 123* 05/11/2013  0349   GLUCOSE 219* 05/01/2013 1221   BUN 10 05/11/2013 0349   BUN 9.0 05/01/2013 1221   CREATININE 1.00 05/11/2013 0349   CREATININE 0.8 05/01/2013 1221   CALCIUM 8.9 05/11/2013 0335   CALCIUM 9.4 05/01/2013 1221   PROT 6.4 05/11/2013 0335   PROT 6.6 05/01/2013 1221   ALBUMIN 3.2* 05/11/2013 0335   ALBUMIN 3.2* 05/01/2013 1221   AST 50* 05/11/2013 0335   AST 32 05/01/2013 1221   ALT 32 05/11/2013 0335   ALT 25 05/01/2013 1221   ALKPHOS 171* 05/11/2013 0335   ALKPHOS 143 05/01/2013 1221   BILITOT 0.7 05/11/2013 0335   BILITOT 0.45 05/01/2013 1221   GFRNONAA 64* 05/11/2013 0335   GFRAA 74* 05/11/2013 0335    GFR Estimated Creatinine Clearance: 60.5 ml/min (by C-G formula based on Cr of 1). No results found for this basename: LIPASE, AMYLASE,  in the last 72 hours No results found for this basename: CKTOTAL, CKMB, CKMBINDEX, TROPONINI,  in the last 72 hours No components found with this basename: POCBNP,  No results found for this basename: DDIMER,  in the last 72 hours No results found for this basename: HGBA1C,  in the last 72 hours No results found for this basename: CHOL, HDL, LDLCALC, TRIG, CHOLHDL, LDLDIRECT,  in the last 72 hours No results found for this basename: TSH, T4TOTAL, FREET3, T3FREE, THYROIDAB,  in the last 72 hours No results found for this basename: VITAMINB12, FOLATE, FERRITIN, TIBC, IRON, RETICCTPCT,  in the last 72 hours Coags:  Recent Labs  05/12/13 0510 05/13/13 0420  INR 2.16* 1.75*   Microbiology: Recent Results (from the past 240 hour(s))  CULTURE, BLOOD (ROUTINE X 2)     Status: None   Collection Time    05/11/13  3:35 AM      Result Value Ref Range Status   Specimen Description BLOOD LEFT ARM   Final   Special  Requests BOTTLES DRAWN AEROBIC AND ANAEROBIC [facility] EACH   Final   Culture  Setup Time     Final   Value: 05/11/2013 13:42     Performed at [facility]   Culture     Final   Value:        BLOOD CULTURE RECEIVED NO GROWTH TO DATE CULTURE WILL BE  HELD FOR 5 DAYS BEFORE ISSUING A FINAL NEGATIVE REPORT     Performed at [facility]   Report Status PENDING   Incomplete  CULTURE, BLOOD (ROUTINE X 2)     Status: None   Collection Time    05/11/13  7:05 AM      Result Value Ref Range Status   Specimen Description BLOOD LEFT HAND   Final   Special Requests BOTTLES DRAWN AEROBIC AND ANAEROBIC 5CC EACH   Final   Culture  Setup Time     Final   Value: 05/11/2013 13:41     Performed at [facility]   Culture     Final   Value:        BLOOD CULTURE RECEIVED NO GROWTH TO DATE CULTURE WILL BE HELD FOR 5 DAYS BEFORE ISSUING A FINAL NEGATIVE REPORT     Performed at [facility]   Report Status PENDING   Incomplete     BRIEF HOSPITAL COURSE:  Pneumonia  - Improved, afebrile since admission, no leukocytosis however immunocompromised  - Started on empiric vancomycin and Levaquin on admission-now day 3, Blood cultures on 3/22 negative so far, will transition to Levaquin on discharge.  Active Problems:  Ascites  - Likely malignant- especially given history of pancreatic cancer  - See no urgent need for paracentesis at this time. Suspect pneumonia is likely the cause of fever.  -Her belly is not tense, she does not appear to be in any distress from ascites-wouold hold off on performing paracentesis   Chronic abdominal pain  - Secondary to stage IV pancreatic cancer  -Pain remains a major concern, have started on Fentanyl transdermally with good response, c/w Dilaudid for breakthrough. Not requiring Dilaudid as frequently upon starting Fentanyl patch. -  c/w bowel regimen with Miralax and senokot on discharge.  Portal vein thrombosis  - On Coumadin, we'll have pharmacy dose. INR subtherapeutic, since being discharged home with hospice, and will likely continue slow deterioration, will not change coumadin dosing on discharge. Discussed with Dr Learta Codding, he will address this with family on follow up visit, and will likely  discontinue.  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  - Patient claims that she no longer is going to pursue chemotherapy  - Had a long discussion with patient and family-now plans are for discharge to home with hospice, DNR status in effect  Anemia/Thrombocytopenia  -secondary to above-continue to monitor periodically   Diabetes  - CBGs controlled, continue with Lantus and SSI on discharge  Hypothyroidism  - Continue levothyroxine   Hypertension  - Continue with Lasix and Cardura   TODAY-DAY OF DISCHARGE:  Subjective:   Katheren Shams today has no headache,no chest abdominal pain,no new weakness tingling or numbness, feels much better wants to go home today.  Objective:   Blood pressure 146/63, pulse 70, temperature 97.9 F (36.6 C), temperature source Oral, resp. rate 17, height 5' 6.5" (1.689 m), weight 97.7 kg (215 lb 6.2 oz), SpO2 99.00%.  Intake/Output Summary (Last 24  hours) at 05/13/13 1423 Last data filed at 05/13/13 1300  Gross per 24 hour  Intake    720 ml  Output      3 ml  Net    717 ml   Filed Weights   05/11/13 0831  Weight: 97.7 kg (215 lb 6.2 oz)    Exam Awake Alert, Oriented *3, No new F.N deficits, Normal affect [place].AT,PERRAL Supple Neck,No JVD, No cervical lymphadenopathy appriciated.  Symmetrical Chest wall movement, Good air movement bilaterally, CTAB RRR,No Gallops,Rubs or new Murmurs, No Parasternal Heave +ve B.Sounds, Abd Soft, Non tender, No organomegaly appriciated, No rebound -guarding or rigidity. No Cyanosis, Clubbing or edema, No new Rash or bruise  DISCHARGE CONDITION: Stable  DISPOSITION: Home with homeHospice  DISCHARGE INSTRUCTIONS:    Activity:  As tolerated with Full fall precautions use walker/cane & assistance as needed  Diet recommendation: Diabetic Diet Heart Healthy diet      Discharge Orders   Future  Appointments Provider Department Dept Phone   05/20/2013 2:30 PM [facility] Nurse [place] [facility] [place] [phone]   Future Orders Complete By Expires   Diet - low sodium heart healthy  As directed    Increase activity slowly  As directed       Follow-up Information   Follow up with Arnette Norris, MD. Schedule an appointment as soon as possible for a visit in 1 week.   Specialty:  Family Medicine   Contact information:   [address] [place] [address] [phone]       Follow up with Betsy Coder, MD. Schedule an appointment as soon as possible for a visit in 1 week.   Specialty:  Oncology   Contact information:   [place] [address] [phone]       Total Time spent on discharge equals 45 minutes.  SignedOren Binet 05/13/2013 2:23 PM

## 2013-05-13 NOTE — Progress Notes (Signed)
Physical Therapy Treatment Patient Details Name: Theresa Summers MRN: [id number] DOB: 06-07-1940 Today's Date: 05/13/2013    History of Present Illness 73 yo female admitted with fever. hx of pancreatic cancer    PT Comments    Progressing with mobility. Pt has some concerns about walking at home. Instructed her to have someone with her while mobilizing until she feels safer. Pt is discharging home with hospice.   Follow Up Recommendations  Supervision/Assistance - 24 hour (home with hospice)     Equipment Recommendations  Rolling walker with 5" wheels;3in1 (PT)    Recommendations for Other Services OT consult     Precautions / Restrictions Precautions Precautions: Fall Restrictions Weight Bearing Restrictions: No    Mobility  Bed Mobility   Bed Mobility: Supine to Sit     Supine to sit: Supervision;HOB elevated        Transfers Overall transfer level: Needs assistance Equipment used: Rolling walker (2 wheeled) Transfers: Sit to/from Stand Sit to Stand: Min guard         General transfer comment: close guard for safety. VCs safety, hand placement  Ambulation/Gait Ambulation/Gait assistance: Min guard Ambulation Distance (Feet): 155 Feet Assistive device: Rolling walker (2 wheeled) Gait Pattern/deviations: Step-through pattern     General Gait Details: VCS safety, technique, posture, distance from walker. slow gait speed. close guard for safety   Stairs            Wheelchair Mobility    Modified Rankin (Stroke Patients Only)       Balance                                    Cognition Arousal/Alertness: Awake/alert Behavior During Therapy: WFL for tasks assessed/performed         Memory: Decreased short-term memory              Exercises      General Comments        Pertinent Vitals/Pain Pt denies pain    Home Living                      Prior Function            PT Goals (current goals can  now be found in the care plan section) Progress towards PT goals: Progressing toward goals    Frequency  Min 3X/week    PT Plan Current plan remains appropriate    End of Session   Activity Tolerance: Patient tolerated treatment well Patient left: in chair;with call bell/phone within reach     Time: 3532-9924 PT Time Calculation (min): 17 min  Charges:  $Gait Training: 8-22 mins                    G Codes:      Weston Anna, MPT Pager: [phone]

## 2013-05-13 NOTE — Progress Notes (Signed)
IP PROGRESS NOTE  Subjective:   Theresa Summers complains of dyspnea and persistent abdominal pain. She met with the hospice team yesterday.  Objective: Vital signs in last 24 hours: Blood pressure 130/60, pulse 69, temperature 98.8 F (37.1 C), temperature source Oral, resp. rate 16, height 5' 6.5" (1.689 m), weight 215 lb 6.2 oz (97.7 kg), SpO2 94.00%.  Intake/Output from previous day: 03/23 0701 - 03/24 0700 In: 26 [P.O.:960] Out: -   Physical Exam:  Lungs: Decreased breath sounds with coarse rhonchi at the posterior base on the left greater than right, no respiratory distress Cardiac: Regular rate and rhythm Abdomen: Distended, nontender, no mass Extremities: No leg edema  Portacath/PICC-with decreased erythema at the right side of the Port-A-Cath  Lab Results:  Recent Labs  05/11/13 0335 05/11/13 0349 05/12/13 0510  WBC 7.6  --  4.8  HGB 9.9* 11.2* 8.6*  HCT 31.5* 33.0* 29.1*  PLT 108*  --  110*   PT/INR 1.75 on 05/13/2013  BMET  Recent Labs  05/11/13 0335 05/11/13 0349  NA 133* 137  K 4.1 4.1  CL 94* 96  CO2 27  --   GLUCOSE 124* 123*  BUN 10 10  CREATININE 0.88 1.00  CALCIUM 8.9  --     Studies/Results: No results found.  Medications: I have reviewed the patient's current medications.  Assessment/Plan:   1. Adenocarcinoma pancreas, pancreas head mass, endoscopic ultrasound on 10/17/2012 confirmed a pancreas head mass without vascular invasion or surrounding lymphadenopathy, biopsy positive for adenocarcinoma.  Elevated CA 19-9.  Initiation of gemcitabine/Abraxane on a three out of four week schedule on 11/21/2012.  CA 19-9 improved at 571 on 12/25/2012.  CA 19-9 improved at 257 on 01/22/2013  Restaging CT 02/10/2013 with an increase in the pancreas mass and no evidence of progressive metastatic disease  Status post SBRT completed 03/20/2013.  CA 19-9 greater than 140,000 on 04/14/2013.  CT abdomen/pelvis 04/16/2013 showed further expansion of  tumor thrombus, worsening splenomegaly; several new hypovascular areas in the liver concerning for metastatic disease ; slight interval increase in ascites and development of new bilateral pleural effusions with increasing soft tissue stranding throughout the retroperitoneum adjacent to the pancreas as well as increasing number of numerous small peripancreatic and retroperitoneal lymph nodes.  MRI liver 04/21/2013 showed heterogeneous hepatic steatosis; concurrent extensive bilateral hepatic metastasis; cystic pancreatic neck and head lesions similar to the recent CT; thrombus within the superior mesenteric vein, splenoportal confluence, main portal vein, left greater than right portal veins similar to the recent CT; small peripancreatic nodes which are indeterminate  2. 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 She is maintained on Coumadin.  3. Diabetes.  4. Right hydronephrosis-? related to history of kidney stones versus carcinomatosis.  5. "Cirrhotic" changes and splenomegaly reported on the chest CT 10/25/2012.  6. History of kidney stones.  7. Hypothyroidism.  8. Normocytic anemia-secondary to chronic disease and chemotherapy, status post a red cell transfusion 01/15/2013.  9. 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  10. Pain secondary to pancreas cancer. Now on a Duragesic patch and Dilaudid  11. Pain/tenderness, edema, erythema right lower leg,resolved 12/25/2012-negative venous duplex 12/25/2012, 01/08/2013 and 01/14/2013. No improvement with multiple courses of antibiotics. Question pseudo-cellulitis secondary to gemcitabine.  12. Thrombocytopenia secondary to recent chemotherapy 13. Fever-she may have "tumor fever ", no fever since hospital admission, cultures remain negative   [name] appears stable. I increase the Dilaudid for breakthrough  pain. She appears stable  for discharge with home hospice care. I discussed the prognosis  with her husband. She does not desire a paracentesis at present.  Recommendations:  1.continue antibiotics and followup cultures  2.Duragesic/Dilaudid for pain 3.consolidate medical regimen as tolerated for comfort care 4.[place] hospice care at home, I will follow her with the [facility] hospice program 5. Outpatient followup will be scheduled at the [facility] within the next few weeks  Please call oncology as needed while she is here. I will arrange for outpatient followup.     LOS: 2 days   Suriya Kovarik  05/13/2013, 1:13 PM

## 2013-05-13 NOTE — Care Management Note (Signed)
    Page 1 of 1   05/13/2013     2:43:33 PM   CARE MANAGEMENT NOTE 05/13/2013  Patient:  Theresa Summers, Theresa Summers   Account Number:  [id number]  Date Initiated:  05/12/2013  Documentation initiated by:  [facility]  Subjective/Objective Assessment:   73 Y/O F ADMITTED W/FEVER.     Action/Plan:   FROM HOME W/SPOUSE.   Anticipated DC Date:  05/13/2013   Anticipated DC Plan:  [place]  CM consult      Choice offered to / List presented to:  C-1 Patient        [place] arranged  HH-1 RN      [place]   Status of service:  Completed, signed off Medicare Important Message given?   (If response is "NO", the following Medicare IM given date fields will be blank) Date Medicare IM given:   Date Additional Medicare IM given:    Discharge Disposition:  [place]  Per UR Regulation:  Reviewed for med. necessity/level of care/duration of stay  If discussed at [place] of Stay Meetings, dates discussed:    Comments:  05/13/13 Murvin Gift RN,BSN NCM [phone] PER MD FOR D/C TODAY HOME W/[facility] AWARE & FOLLOWING.  05/12/13 Erial Fikes RN,BSN NCM [phone] REFERRAL FOR HOME HOSPICE CHOICE-[facility] CHOSEN BY PATIENT/DTR.TC [facility]-FOLLOWING.NOTED ON 02.

## 2013-05-14 ENCOUNTER — Other Ambulatory Visit: Payer: 59

## 2013-05-14 ENCOUNTER — Telehealth: Payer: Self-pay | Admitting: Pharmacist

## 2013-05-14 ENCOUNTER — Ambulatory Visit: Payer: 59

## 2013-05-14 ENCOUNTER — Telehealth: Payer: Self-pay | Admitting: *Deleted

## 2013-05-14 ENCOUNTER — Inpatient Hospital Stay: Payer: 59

## 2013-05-14 ENCOUNTER — Ambulatory Visit: Payer: 59 | Admitting: Nurse Practitioner

## 2013-05-14 NOTE — Telephone Encounter (Signed)
Carol from [facility] returned call.  She will be calling PT/INR results into [place].  Next PT/INR will be Friday 05/16/13.

## 2013-05-14 NOTE — Telephone Encounter (Signed)
Received phone call from [place] with [facility].  Kayleen Memos stated that patient is currently taking clonazepam 0.1mg  but would rather take lorazepam.  Kayleen Memos stated that patient already has a prescription for lorazepam and has enough to get her through.  Also, Kayleen Memos stated that patient has had some nasal drainage and is taking Mucinex, wanted to know if it is ok for patient to also take claritin and flonase.  Spoke with Elby Showers. Marcello Moores, NP  She states that it is ok for patient to take the lorazepam, flonase and mucinex. Informed Kayleen Memos of information.  She verbalized understanding.

## 2013-05-14 NOTE — Telephone Encounter (Signed)
I left VM message for Arbie Cookey at [facility] [place] [phone]).  I gave a verbal order to check PT/INR weekly starting 05/16/13.  Results to be faxed to [place] [phone]

## 2013-05-16 ENCOUNTER — Ambulatory Visit: Payer: 59 | Admitting: Pharmacist

## 2013-05-16 ENCOUNTER — Telehealth: Payer: Self-pay

## 2013-05-16 ENCOUNTER — Other Ambulatory Visit: Payer: Self-pay | Admitting: Nurse Practitioner

## 2013-05-16 DIAGNOSIS — C259 Malignant neoplasm of pancreas, unspecified: Secondary | ICD-10-CM

## 2013-05-16 DIAGNOSIS — I81 Portal vein thrombosis: Secondary | ICD-10-CM

## 2013-05-16 LAB — POCT INR: INR: 2.4

## 2013-05-16 MED ORDER — CYANOCOBALAMIN 1000 MCG/ML IJ SOLN: 1000.0000 ug | Freq: Once | INTRAMUSCULAR | Status: DC

## 2013-05-16 NOTE — Telephone Encounter (Signed)
Theresa Summers with Theresa Summers left v/m; pt is scheduled to get B12 shot next week; Theresa Summers can give pt B 12 shot on her next visit. Request rx for Vit B 12 sent to [facility] outpatient pharmacy.Please advise.

## 2013-05-16 NOTE — Telephone Encounter (Signed)
Lm on Carol's vm informing her Rx has been sent to requested pharmacy

## 2013-05-16 NOTE — Progress Notes (Signed)
PT seen by [facility] INR=2.4 No changes to report from Hospice nurse Continue Coumadin 5 mg daily and 7.5 mg on Tuesday, Thursdays, and Saturdays. Recheck next week on 05/21/13 with hospice nurse

## 2013-05-16 NOTE — Patient Instructions (Addendum)
Continue Coumadin 5 mg daily and 7.5 mg on Tuesday, Thursdays, and Saturdays.  Recheck next week on 05/21/13 with Hospice

## 2013-05-16 NOTE — Telephone Encounter (Signed)
Rx sent 

## 2013-05-17 LAB — CULTURE, BLOOD (ROUTINE X 2)
Culture: NO GROWTH
Culture: NO GROWTH

## 2013-05-19 NOTE — Progress Notes (Signed)
FAXED 22 PAGES TO Tobey Bride, RN @ [facility] [phone] AND HER PHONE NUMBER IS [phone] [phone].

## 2013-05-20 ENCOUNTER — Telehealth: Payer: Self-pay | Admitting: Oncology

## 2013-05-20 ENCOUNTER — Other Ambulatory Visit: Payer: Self-pay | Admitting: *Deleted

## 2013-05-20 ENCOUNTER — Ambulatory Visit: Payer: 59

## 2013-05-20 NOTE — Telephone Encounter (Signed)
lmonvm advising the pt of her April 2015 appt.

## 2013-05-21 ENCOUNTER — Ambulatory Visit: Payer: 59 | Admitting: Pharmacist

## 2013-05-21 DIAGNOSIS — I81 Portal vein thrombosis: Secondary | ICD-10-CM

## 2013-05-21 LAB — POCT INR: INR: 2.6

## 2013-05-21 NOTE — Progress Notes (Signed)
INR = 2.6    Goal 2-3 INR remains in goal range. No complications of anticoagulation noted. Patient sees Dr.Sherrill next week on 4/8. Hospice RN sees her again on 4/9 and will draw INR at that time. She will continue Coumadin 5 mg daily except 7.5 mg Tues, Thurs, Sat. Hospice will call us with results on 4/9.    Theresa Summers, PharmD  NO CHARGE - TELEPHONE ENCOUNTER ONLY

## 2013-05-25 ENCOUNTER — Other Ambulatory Visit: Payer: Self-pay | Admitting: Oncology

## 2013-05-28 ENCOUNTER — Other Ambulatory Visit: Payer: Self-pay | Admitting: *Deleted

## 2013-05-28 ENCOUNTER — Telehealth: Payer: Self-pay | Admitting: Oncology

## 2013-05-28 ENCOUNTER — Ambulatory Visit (HOSPITAL_BASED_OUTPATIENT_CLINIC_OR_DEPARTMENT_OTHER): Admitting: Oncology

## 2013-05-28 ENCOUNTER — Ambulatory Visit (HOSPITAL_COMMUNITY)
Admission: RE | Admit: 2013-05-28 | Discharge: 2013-05-28 | Disposition: A | Discharge status: Home / Self Care | Payer: 59 | Source: Ambulatory Visit | Attending: Oncology | Admitting: Oncology

## 2013-05-28 ENCOUNTER — Telehealth: Payer: Self-pay | Admitting: *Deleted

## 2013-05-28 ENCOUNTER — Encounter: Payer: Self-pay | Admitting: Oncology

## 2013-05-28 ENCOUNTER — Ambulatory Visit (HOSPITAL_BASED_OUTPATIENT_CLINIC_OR_DEPARTMENT_OTHER)

## 2013-05-28 VITALS — BP 115/58 | HR 85 | Temp 98.6°F | Resp 18 | Ht 66.5 in | Wt 236.1 lb

## 2013-05-28 DIAGNOSIS — R319 Hematuria, unspecified: Secondary | ICD-10-CM | POA: Diagnosis not present

## 2013-05-28 DIAGNOSIS — G609 Hereditary and idiopathic neuropathy, unspecified: Secondary | ICD-10-CM

## 2013-05-28 DIAGNOSIS — G893 Neoplasm related pain (acute) (chronic): Secondary | ICD-10-CM

## 2013-05-28 DIAGNOSIS — C250 Malignant neoplasm of head of pancreas: Secondary | ICD-10-CM

## 2013-05-28 DIAGNOSIS — C259 Malignant neoplasm of pancreas, unspecified: Secondary | ICD-10-CM | POA: Insufficient documentation

## 2013-05-28 DIAGNOSIS — J90 Pleural effusion, not elsewhere classified: Secondary | ICD-10-CM | POA: Insufficient documentation

## 2013-05-28 DIAGNOSIS — R109 Unspecified abdominal pain: Secondary | ICD-10-CM | POA: Diagnosis not present

## 2013-05-28 DIAGNOSIS — M549 Dorsalgia, unspecified: Secondary | ICD-10-CM

## 2013-05-28 DIAGNOSIS — R0602 Shortness of breath: Secondary | ICD-10-CM | POA: Insufficient documentation

## 2013-05-28 DIAGNOSIS — R188 Other ascites: Secondary | ICD-10-CM | POA: Insufficient documentation

## 2013-05-28 LAB — URINALYSIS, MICROSCOPIC - CHCC
Bilirubin (Urine): NEGATIVE
Glucose: NEGATIVE mg/dL
Ketones: NEGATIVE mg/dL
LEUKOCYTE ESTERASE: NEGATIVE
NITRITE: NEGATIVE
PH: 6 (ref 4.6–8.0)
PROTEIN: 30 mg/dL
Specific Gravity, Urine: 1.03 (ref 1.003–1.035)
Urobilinogen, UR: 0.2 mg/dL (ref 0.2–1)

## 2013-05-28 MED ORDER — FENTANYL 75 MCG/HR TD PT72: 75.0000 ug | MEDICATED_PATCH | TRANSDERMAL | Status: DC

## 2013-05-28 NOTE — Procedures (Signed)
Successful US guided paracentesis from right lower quadrant/suprapubic region.  Yielded 2.3 liters of cloudy amber colored fluid.  No immediate complications.  Pt tolerated well.   Specimen was not sent for labs.  Hedy Jacob PA-C 05/28/2013 3:32 PM

## 2013-05-28 NOTE — Progress Notes (Addendum)
GAVE ORIGINAL TO PATIENT.  EBONY HAD FAXED THESE TO KRISTA PELATA ([facility]) [phone] AND TO [phone] PER NOTE AT THE BOTTOM OF THE FORM. LEFT WITH MS. BJSEG AS PATIENT WAS TIRED AND WILL PICK UP LATER.  05/30/13 FAXED CLARIFICATION THAT DR. SHERRILL COMPLETED TO [facility] [id number].

## 2013-05-28 NOTE — Telephone Encounter (Signed)
Called pt's home # & spoke with daughter & informed that pt doesn't have UTI & to stop norvasc per Dr Benay Spice.  Daughter reports that pt stopped norvasc already.

## 2013-05-28 NOTE — Progress Notes (Signed)
[facility] OFFICE PROGRESS NOTE   Diagnosis: Metastatic pancreas cancer  INTERVAL HISTORY:   She completed a single cycle of FOLFOX 05/01/2013. Theresa Summers was admitted 05/11/2013 with fever and confusion. A CT revealed evidence of pneumonia, progressive chest adenopathy, and possible lymphatic tumor spread in the lungs. She decided against further systemic therapy and enrolled in the [facility] hospice program.  Theresa Summers has multiple complaints today. She has abdomen and right back pain. The pain is relieved with frequent Dilaudid. She continues a Duragesic patch. The family reports she has memory difficulty and myoclonus. It is hard for her to ambulate. She has increased abdominal distention and leg swelling.   The hospice nurse is visiting weekly. Objective:  Vital signs in last 24 hours:  Blood pressure 115/58, pulse 85, temperature 98.6 F (37 C), temperature source Oral, resp. rate 18, height 5' 6.5" (1.689 m), weight 236 lb 1.6 oz (107.094 kg).    Resp: Decreased breath sounds with inspiratory rhonchi at the lower posterior chest bilaterally Cardio: Regular rate and rhythm GI: Markedly distended Vascular: Pitting edema throughout the legs bilaterally  Skin: Small superficial ulcers at the great toe bilaterally   Portacath/PICC-without erythema  Lab Results:  Lab Results  Component Value Date   WBC 4.8 05/12/2013   HGB 8.6* 05/12/2013   HCT 29.1* 05/12/2013   MCV 82.7 05/12/2013   PLT 110* 05/12/2013   NEUTROABS 5.9 05/03/2013    Lab Results  Component Value Date   NA 137 05/11/2013    No results found for this basename: CEA,  CA199,  CA125    Imaging:  No results found.  Medications: I have reviewed the patient's current medications.  Assessment/Plan: 1. Adenocarcinoma pancreas, pancreas head mass, endoscopic ultrasound on 10/17/2012 confirmed a pancreas head mass without vascular invasion or surrounding lymphadenopathy, biopsy positive for  adenocarcinoma.  Elevated CA 19-9.  Initiation of gemcitabine/Abraxane on a three out of four week schedule on 11/21/2012.  CA 19-9 improved at 571 on 12/25/2012.  CA 19-9 improved at 257 on 01/22/2013  Restaging CT 02/10/2013 with an increase in the pancreas mass and no evidence of progressive metastatic disease  Status post SBRT completed 03/20/2013.  CA 19-9 greater than 140,000 on 04/14/2013.  CT abdomen/pelvis 04/16/2013 showed further expansion of tumor thrombus, worsening splenomegaly; several new hypovascular areas in the liver concerning for metastatic disease ; slight interval increase in ascites and development of new bilateral pleural effusions with increasing soft tissue stranding throughout the retroperitoneum adjacent to the pancreas as well as increasing number of numerous small peripancreatic and retroperitoneal lymph nodes.  MRI liver 04/21/2013 showed heterogeneous hepatic steatosis; concurrent extensive bilateral hepatic metastasis; cystic pancreatic neck and head lesions similar to the recent CT; thrombus within the superior mesenteric vein, splenoportal confluence, main portal vein, left greater than right portal veins similar to the recent CT; small peripancreatic nodes which are indeterminate  Cycle 1 FOLFOX 05/01/2013 2. 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  3. Diabetes.  4. Right hydronephrosis-? related to history of kidney stones versus carcinomatosis.  5. "Cirrhotic" changes and splenomegaly reported on the chest CT 10/25/2012.  6. History of kidney stones.  7. Hypothyroidism.  8. Normocytic anemia-secondary to chronic disease and chemotherapy, status post a red cell transfusion 01/15/2013.  9. 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  10. Pain secondary to pancreas cancer.  11. Pain/tenderness, edema, erythema right lower leg,resolved  12/25/2012-negative venous duplex  12/25/2012, 01/08/2013 and 01/14/2013. No improvement with multiple courses of antibiotics. Question pseudo-cellulitis secondary to gemcitabine.  12. History of pancytopenia secondary to chemotherapy.  13.dyspnea-effusions and possible lymphatic tumor spread noted on a chest CT 05/11/2013   Disposition:  Theresa Summers has metastatic pancreas cancer. I discussed her current symptoms and management options with Ms. Theresa Summers and multiple family members. We increased the Duragesic patch to 75 mcg. She will continue Dilaudid for breakthrough pain.  We discussed the risk/benefit of continuing Coumadin. It will be difficult to regulate Coumadin with her nutritional status, and she is at risk for falls. We decided to discontinue Coumadin.  Theresa Summers will be referred for a therapeutic paracentesis and chest x-ray today. We will also check a urinalysis based on her report of hematuria.  She will continue close followup with the [facility] program. Theresa Summers will return for an office visit in approximately 3 weeks. We will arrange for home oxygen.  Ladell Pier, MD  05/28/2013  12:13 PM

## 2013-05-28 NOTE — Telephone Encounter (Signed)
pt will have a paracenthesis today 2 pm with Chest xray, pt has appt calendar for MAy 2015

## 2013-05-28 NOTE — Patient Instructions (Addendum)
Discontinue your Zocor and Coumadin. Increase Duragesic patch to 75 mcg every 3 days. We will call Hospice to flush your [facility] when due and to order oxygen  Fall Prevention and Home Safety Falls cause injuries and can affect all age groups. It is possible to use preventive measures to significantly decrease the likelihood of falls. There are many simple measures which can make your home safer and prevent falls.  USE YOUR WALKER TO AMBULATE IN HOME  OUTDOORS  Repair cracks and edges of walkways and driveways.  Remove high doorway thresholds.  Trim shrubbery on the main path into your home.  Have good outside lighting.  Clear walkways of tools, rocks, debris, and clutter.  Check that handrails are not broken and are securely fastened. Both sides of steps should have handrails.  Have leaves, snow, and ice cleared regularly.  Use sand or salt on walkways during winter months.  In the garage, clean up grease or oil spills. BATHROOM  Install night lights.  Install grab bars by the toilet and in the tub and shower.  Use non-skid mats or decals in the tub or shower.  Place a plastic non-slip stool in the shower to sit on, if needed.  Keep floors dry and clean up all water on the floor immediately.  Remove soap buildup in the tub or shower on a regular basis.  Secure bath mats with non-slip, double-sided rug tape.  Remove throw rugs and tripping hazards from the floors. BEDROOMS  Install night lights.  Make sure a bedside light is easy to reach.  Do not use oversized bedding.  Keep a telephone by your bedside.  Have a firm chair with side arms to use for getting dressed.  Remove throw rugs and tripping hazards from the floor. KITCHEN  Keep handles on pots and pans turned toward the center of the stove. Use back burners when possible.  Clean up spills quickly and allow time for drying.  Avoid walking on wet floors.  Avoid hot utensils and knives.  Position  shelves so they are not too high or low.  Place commonly used objects within easy reach.  Keep electrical cables out of the way.  Do not use floor polish or wax that makes floors slippery. If you must use wax, use non-skid floor wax.  Remove throw rugs and tripping hazards from the floor. STAIRWAYS  Never leave objects on stairs.  Place handrails on both sides of stairways and use them. Fix any loose handrails. Make sure handrails on both sides of the stairways are as long as the stairs.  Check carpeting to make sure it is firmly attached along stairs. Make repairs to worn or loose carpet promptly.  Avoid placing throw rugs at the top or bottom of stairways, or properly secure the rug with carpet tape to prevent slippage. Get rid of throw rugs, if possible.  Have an electrician put in a light switch at the top and bottom of the stairs. OTHER FALL PREVENTION TIPS  Wear low-heel or rubber-soled shoes that are supportive and fit well. Wear closed toe shoes.  When using a stepladder, make sure it is fully opened and both spreaders are firmly locked. Do not climb a closed stepladder.  Add color or contrast paint or tape to grab bars and handrails in your home. Place contrasting color strips on first and last steps.  Learn and use mobility aids as needed. Install an electrical emergency response system.  Turn on lights to avoid dark areas. Replace  light bulbs that burn out immediately. Get light switches that glow.  Arrange furniture to create clear pathways. Keep furniture in the same place.  Firmly attach carpet with non-skid or double-sided tape.  Eliminate uneven floor surfaces.  Select a carpet pattern that does not visually hide the edge of steps.  Be aware of all pets. OTHER HOME SAFETY TIPS  Set the water temperature for 120 F (48.8 C).  Keep emergency numbers on or near the telephone.  Keep smoke detectors on every level of the home and near sleeping  areas. Document Released: 01/27/2002 Document Revised: 08/08/2011 Document Reviewed: 04/28/2011 [facility] Patient Information 2014 [place].

## 2013-05-28 NOTE — Telephone Encounter (Signed)
gave pt appt for ML, sent to IR today

## 2013-05-28 NOTE — Progress Notes (Signed)
Called Hospice triage nurse and left message to get oxygen in home for patient at 2 l/min [place] prn.

## 2013-05-30 ENCOUNTER — Other Ambulatory Visit: Payer: Self-pay | Admitting: *Deleted

## 2013-05-30 DIAGNOSIS — C259 Malignant neoplasm of pancreas, unspecified: Secondary | ICD-10-CM

## 2013-05-30 MED ORDER — ONDANSETRON 8 MG PO TBDP: 8.0000 mg | ORAL_TABLET | Freq: Three times a day (TID) | ORAL | Status: DC | PRN

## 2013-06-02 ENCOUNTER — Telehealth: Payer: Self-pay | Admitting: *Deleted

## 2013-06-02 ENCOUNTER — Other Ambulatory Visit: Payer: Self-pay | Admitting: Oncology

## 2013-06-02 DIAGNOSIS — C250 Malignant neoplasm of head of pancreas: Secondary | ICD-10-CM

## 2013-06-02 NOTE — Telephone Encounter (Signed)
Message copied by Domenic Schwab on Mon Jun 02, 2013 11:55 AM ------      Message from: Betsy Coder B      Created: Wed May 28, 2013  5:35 PM       Please call patient, cxr with stable mild  Pleural effusions ------

## 2013-06-02 NOTE — Telephone Encounter (Signed)
Per Dr. Benay Spice, notified pt that cxr with stable mild pleural effusions noted.  Pt verbalized understanding.

## 2013-06-03 ENCOUNTER — Telehealth: Payer: Self-pay | Admitting: *Deleted

## 2013-06-03 ENCOUNTER — Other Ambulatory Visit: Payer: Self-pay | Admitting: *Deleted

## 2013-06-03 DIAGNOSIS — C250 Malignant neoplasm of head of pancreas: Secondary | ICD-10-CM

## 2013-06-03 NOTE — Telephone Encounter (Signed)
[facility] RN called to notify office pt is requesting a therapeutic paracentesis to be done if possible.  "her abdomen is increasing in fullness and is firm"  Per Dr. Benay Spice; OK to schedule.

## 2013-06-03 NOTE — Telephone Encounter (Signed)
Notified pt paracentesis is scheduled for Thur 4/16 arrive at 10:45 at [facility].  Pt verbalized understanding and expressed appreciation for call back.

## 2013-06-05 ENCOUNTER — Other Ambulatory Visit: Payer: Self-pay | Admitting: Oncology

## 2013-06-05 ENCOUNTER — Ambulatory Visit (HOSPITAL_COMMUNITY)
Admission: RE | Admit: 2013-06-05 | Discharge: 2013-06-05 | Disposition: A | Discharge status: Home / Self Care | Payer: 59 | Source: Ambulatory Visit | Attending: Oncology | Admitting: Oncology

## 2013-06-05 DIAGNOSIS — R109 Unspecified abdominal pain: Secondary | ICD-10-CM | POA: Insufficient documentation

## 2013-06-05 DIAGNOSIS — R188 Other ascites: Secondary | ICD-10-CM | POA: Insufficient documentation

## 2013-06-05 DIAGNOSIS — C250 Malignant neoplasm of head of pancreas: Secondary | ICD-10-CM

## 2013-06-05 DIAGNOSIS — C259 Malignant neoplasm of pancreas, unspecified: Secondary | ICD-10-CM | POA: Insufficient documentation

## 2013-06-06 ENCOUNTER — Other Ambulatory Visit: Payer: Self-pay | Admitting: *Deleted

## 2013-06-06 DIAGNOSIS — C259 Malignant neoplasm of pancreas, unspecified: Secondary | ICD-10-CM

## 2013-06-06 MED ORDER — HYDROMORPHONE HCL 1 MG/ML PO LIQD: 2.0000 mg | ORAL | Status: DC | PRN

## 2013-06-06 MED ORDER — FENTANYL 75 MCG/HR TD PT72: 75.0000 ug | MEDICATED_PATCH | TRANSDERMAL | Status: DC

## 2013-06-06 NOTE — Telephone Encounter (Signed)
Call from [place], [place] with hospice reporting pt will ned a refill of Fentanyl patches. Placed last patch today. Per Arbie Cookey, pt's husband had to replace patch early twice because it had fallen off. Arbie Cookey will see pt today, will reinforce teaching with husband on placing and securing Fentanyl patch. Rx faxed to [place].

## 2013-06-06 NOTE — Telephone Encounter (Signed)
Message from [place], [place] with hospice reporting Dilaudid 2-3 mg Q4 hours is no longer controlling pain. Called pt's home, spoke with daughter who reports pt's pain was 9/10 today. Has not tried 3 mg dose. Instructed her to have pt take Dilaudid 3 mg Q4 hours PRN breakthrough pain. Call MD on call or hospice over the weekend if pain is not controlled. She voiced understanding.

## 2013-06-09 ENCOUNTER — Telehealth: Payer: Self-pay | Admitting: *Deleted

## 2013-06-09 DIAGNOSIS — C259 Malignant neoplasm of pancreas, unspecified: Secondary | ICD-10-CM

## 2013-06-09 MED ORDER — HYDROMORPHONE HCL 1 MG/ML PO LIQD: 6.0000 mg | ORAL | Status: DC | PRN

## 2013-06-09 MED ORDER — ONDANSETRON 8 MG PO TBDP: 8.0000 mg | ORAL_TABLET | Freq: Three times a day (TID) | ORAL | Status: AC | PRN

## 2013-06-09 NOTE — Telephone Encounter (Signed)
Per Dr. Benay Spice : Increase duragesic to 150 mcg every 72 hours and may increase dilaudid to 6-9 mg every 3 hours as needed.

## 2013-06-09 NOTE — Telephone Encounter (Signed)
Abdominal pain persists despite Duragesic 75 mcg every 3 days (dose increased 05/28/13)  and taking Dilaudid 3 mg every 3-4 hours ATC. Also needs refill on Zofran OTD 8 mg. Patient has lost her medication. Nurse talked to family about using locked box for her medications now.

## 2013-06-12 ENCOUNTER — Other Ambulatory Visit: Payer: Self-pay | Admitting: *Deleted

## 2013-06-12 MED ORDER — FENTANYL 75 MCG/HR TD PT72: 150.0000 ug | MEDICATED_PATCH | TRANSDERMAL | Status: DC

## 2013-06-12 NOTE — Telephone Encounter (Signed)
Message from [place], [place] reporting pt needs refill on Fentanyl patches since dose has been increase to 150 mcg. Placing 2 patches Q 3days. Will fax Rx to pharmacy.

## 2013-06-16 ENCOUNTER — Other Ambulatory Visit: Payer: Self-pay | Admitting: Family Medicine

## 2013-06-16 ENCOUNTER — Ambulatory Visit (HOSPITAL_COMMUNITY)
Admission: RE | Admit: 2013-06-16 | Discharge: 2013-06-16 | Disposition: A | Discharge status: Home / Self Care | Payer: 59 | Source: Ambulatory Visit | Attending: Oncology | Admitting: Oncology

## 2013-06-16 ENCOUNTER — Telehealth: Payer: Self-pay | Admitting: *Deleted

## 2013-06-16 ENCOUNTER — Other Ambulatory Visit: Payer: Self-pay | Admitting: *Deleted

## 2013-06-16 DIAGNOSIS — C250 Malignant neoplasm of head of pancreas: Secondary | ICD-10-CM

## 2013-06-16 DIAGNOSIS — R180 Malignant ascites: Secondary | ICD-10-CM

## 2013-06-16 DIAGNOSIS — R188 Other ascites: Secondary | ICD-10-CM | POA: Insufficient documentation

## 2013-06-16 NOTE — Procedures (Signed)
Successful US guided paracentesis from RLQ.  Yielded 3.6L of clear yellow fluid.  No immediate complications.  Pt tolerated well.   Specimen was not sent for labs.  Ascencion Dike PA-C 06/16/2013 3:53 PM

## 2013-06-16 NOTE — Telephone Encounter (Signed)
Pt requesting medication refill. Last ov 03/2013 with no future appts scheduled. pls advise 

## 2013-06-16 NOTE — Telephone Encounter (Signed)
Call from [place] with hospice reporting is having increased confusion and paranoia. Pt is getting up at 3 and 4 AM to get dressed for the day. Pt still reports pain on increased dose of Fentanyl. Family is giving Dilaudid 2-3mg  Q 3hours PRN. They are not giving increased dose due to confusion. Per Arbie Cookey, family denies any needs at this time. Just want to make Dr. Benay Spice aware of confusion. Dr. Benay Spice made aware.

## 2013-06-16 NOTE — Telephone Encounter (Signed)
Message from pt's daughter at [phone] and [phone] requesting to be worked in for paracentesis today. Reports painful abdominal distention. Pt worked in for therapeutic paracentesis at [employer] today. Mayer Camel with appt time.

## 2013-06-17 ENCOUNTER — Telehealth: Payer: Self-pay | Admitting: *Deleted

## 2013-06-17 MED ORDER — MORPHINE SULFATE (CONCENTRATE) 20 MG/ML PO SOLN
10.0000 mg | ORAL | Status: DC | PRN
Start: 2013-06-17 — End: 2013-06-20

## 2013-06-17 NOTE — Telephone Encounter (Signed)
Order received from Dr. Benay Spice for Roxanol 20 mg/mL 0.5-26mL Q3 hours PRN. Rx to be faxed to [place] (hospice pt).

## 2013-06-17 NOTE — Telephone Encounter (Signed)
Message from [place], [place] RN reporting pt's family thinks "strange behavior" seems to worsen with Dilaudid. Requesting to change breakthrough pain med to see if she is less confused, paranoid. Will need concentrated liquid.

## 2013-06-20 ENCOUNTER — Ambulatory Visit (HOSPITAL_BASED_OUTPATIENT_CLINIC_OR_DEPARTMENT_OTHER): Payer: 59 | Admitting: Oncology

## 2013-06-20 ENCOUNTER — Telehealth: Payer: Self-pay | Admitting: Oncology

## 2013-06-20 VITALS — BP 133/53 | HR 81 | Temp 99.3°F | Resp 18 | Ht 66.0 in | Wt 218.5 lb

## 2013-06-20 DIAGNOSIS — G893 Neoplasm related pain (acute) (chronic): Secondary | ICD-10-CM

## 2013-06-20 DIAGNOSIS — F411 Generalized anxiety disorder: Secondary | ICD-10-CM

## 2013-06-20 DIAGNOSIS — R109 Unspecified abdominal pain: Secondary | ICD-10-CM

## 2013-06-20 DIAGNOSIS — G609 Hereditary and idiopathic neuropathy, unspecified: Secondary | ICD-10-CM

## 2013-06-20 DIAGNOSIS — M549 Dorsalgia, unspecified: Secondary | ICD-10-CM

## 2013-06-20 DIAGNOSIS — C250 Malignant neoplasm of head of pancreas: Secondary | ICD-10-CM

## 2013-06-20 MED ORDER — FENTANYL 75 MCG/HR TD PT72: 150.0000 ug | MEDICATED_PATCH | TRANSDERMAL | Status: AC

## 2013-06-20 MED ORDER — MORPHINE SULFATE (CONCENTRATE) 20 MG/ML PO SOLN: 10.0000 mg | ORAL | Status: AC | PRN

## 2013-06-20 MED ORDER — ALPRAZOLAM 0.5 MG PO TABS: 0.5000 mg | ORAL_TABLET | Freq: Three times a day (TID) | ORAL | Status: AC | PRN

## 2013-06-20 NOTE — Progress Notes (Signed)
[place] OFFICE PROGRESS NOTE   Diagnosis: Pancreas cancer  INTERVAL HISTORY:   Theresa Summers returns as scheduled. She is accompanied by multiple family members. The hospice nurse visits twice weekly. She continues to have abdomen and back pain. She developed agitation and confusion with Dilaudid. She started Roxanol 2 days ago. Roxanol has helped the pain. Theresa Summers continues to be confused. She had diarrhea last week and the laxatives were held. The last bowel movement was 5 days ago.  She underwent a therapeutic paracentesis 06/16/2013 for 3.6 L of fluid.  The blood sugar has been running low, in the 50-70 range in the mornings.  Objective:  Vital signs in last 24 hours:  Blood pressure 133/53, pulse 81, temperature 99.3 F (37.4 C), temperature source Oral, resp. rate 18, height 5\' 6"  (1.676 m), weight 218 lb 8 oz (99.111 kg), SpO2 93.00%.    HEENT: The mouth is dry, no thrush Resp: Inspiratory rhonchi at the lower posterior chest bilaterally, no respiratory distress Cardio: Regular rate and rhythm GI: Distended, no hepatomegaly, no mass Vascular: 1-2+ pitting edema at the legs bilaterally Neuro: Lethargic, rales of, follows commands    Portacath/PICC-without erythema  Medications: I have reviewed the patient's current medications.  Assessment/Plan: 1. Adenocarcinoma pancreas, pancreas head mass, endoscopic ultrasound on 10/17/2012 confirmed a pancreas head mass without vascular invasion or surrounding lymphadenopathy, biopsy positive for adenocarcinoma.  Elevated CA 19-9.  Initiation of gemcitabine/Abraxane on a three out of four week schedule on 11/21/2012.  CA 19-9 improved at 571 on 12/25/2012.  CA 19-9 improved at 257 on 01/22/2013  Restaging CT 02/10/2013 with an increase in the pancreas mass and no evidence of progressive metastatic disease  Status post SBRT completed 03/20/2013.  CA 19-9 greater than 140,000 on 04/14/2013.  CT abdomen/pelvis  04/16/2013 showed further expansion of tumor thrombus, worsening splenomegaly; several new hypovascular areas in the liver concerning for metastatic disease ; slight interval increase in ascites and development of new bilateral pleural effusions with increasing soft tissue stranding throughout the retroperitoneum adjacent to the pancreas as well as increasing number of numerous small peripancreatic and retroperitoneal lymph nodes.  MRI liver 04/21/2013 showed heterogeneous hepatic steatosis; concurrent extensive bilateral hepatic metastasis; cystic pancreatic neck and head lesions similar to the recent CT; thrombus within the superior mesenteric vein, splenoportal confluence, main portal vein, left greater than right portal veins similar to the recent CT; small peripancreatic nodes which are indeterminate  Cycle 1 FOLFOX 05/01/2013 2. 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  3. Diabetes.  4. Right hydronephrosis-? related to history of kidney stones versus carcinomatosis.  5. "Cirrhotic" changes and splenomegaly reported on the chest CT 10/25/2012.  6. History of kidney stones.  7. Hypothyroidism.  8. Normocytic anemia-secondary to chronic disease and chemotherapy, status post a red cell transfusion 01/15/2013.  9. 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  10. Pain secondary to pancreas cancer.  11. Pain/tenderness, edema, erythema right lower leg,resolved 12/25/2012-negative venous duplex 12/25/2012, 01/08/2013 and 01/14/2013. No improvement with multiple courses of antibiotics. Question pseudo-cellulitis secondary to gemcitabine.  12. History of pancytopenia secondary to chemotherapy.  13.dyspnea-effusions and possible lymphatic tumor spread noted on a chest CT 05/11/2013   Disposition:  Theresa Summers appears to be declining from metastatic pancreas cancer. The plan is to  continue Duragesic/Roxanol for pain. She will discontinue Lantus insulin with the limited oral intake and low blood sugars. She will try Xanax  for anxiety.  Theresa Summers will return for an office visit in 3 weeks. She continues close followup with the [facility] program. We will ask the hospice RN to flush the Port-A-Cath.  Ladell Pier, MD  06/20/2013  12:40 PM

## 2013-06-20 NOTE — Telephone Encounter (Signed)
Gave pt appt for MD only today

## 2013-06-20 NOTE — Patient Instructions (Addendum)
[place] Discharge instructions: Continue Duragesic patch at 150 mcg every 72 hours and Roxanol concentrate prn Discontinue Ativan and try Xanax 0.5 mg three times daily as needed Discontinue insulin. Hospice nurse to flush [facility]

## 2013-06-25 ENCOUNTER — Telehealth: Payer: Self-pay | Admitting: *Deleted

## 2013-06-25 NOTE — Telephone Encounter (Signed)
Rapid decline in last 24 hours. Unresponsive. Excess oral secretions-will activate SO for Atropine drops. Seems comfortable. May move to [facility] tomorrow at family request.

## 2013-06-25 NOTE — Telephone Encounter (Signed)
Being admitted to [facility] tomorrow.

## 2013-07-11 ENCOUNTER — Ambulatory Visit: Payer: 59 | Admitting: Oncology

## 2013-07-21 DEATH — deceased

## 2013-10-14 ENCOUNTER — Other Ambulatory Visit: Payer: Self-pay | Admitting: Pharmacist

## 2014-12-22 IMAGING — CT CT CHEST W/ CM
1 series · 15 of 32 positions shown, 19 images · IV contrast (OMNIPAQUE 300)
Comparison: CT chest 10/25/2012.

CLINICAL DATA: Fever of unknown origin. Current history of
pancreatic cancer.

EXAM:
CT CHEST WITH CONTRAST
TECHNIQUE: Multidetector CT imaging of the chest was performed during
intravenous contrast administration.
CONTRAST:  100mL OMNIPAQUE IOHEXOL 300 MG/ML IV.

[Series 2: chest with st · axial · 0.71mm/px · z∈[-294,-54]mm · 15 of 55 slices shown, 19 images]
[im 5/55  mediastinal]
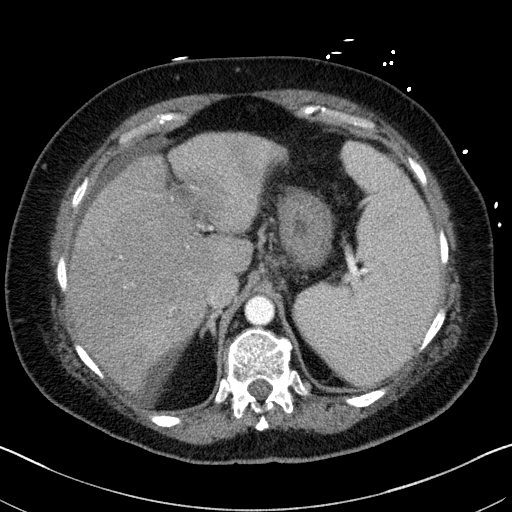
[im 5/55  lung]
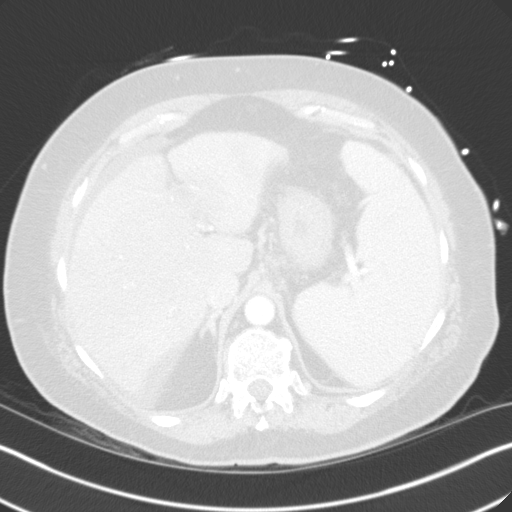
[im 9/55  lung]
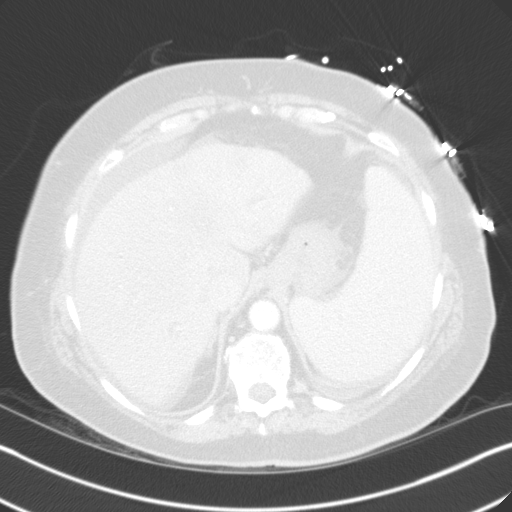
[im 13/55  lung]
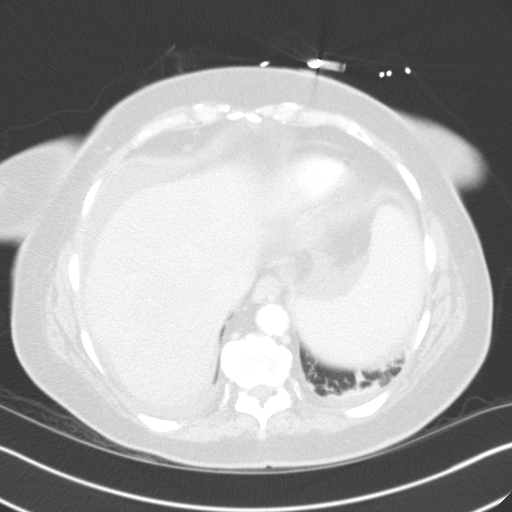
[im 17/55  lung]
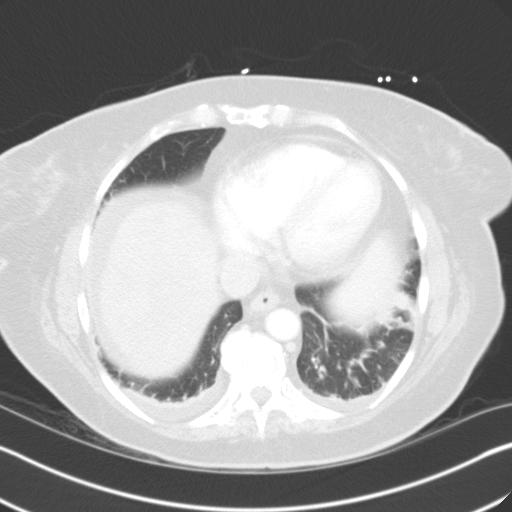
[im 21/55  mediastinal]
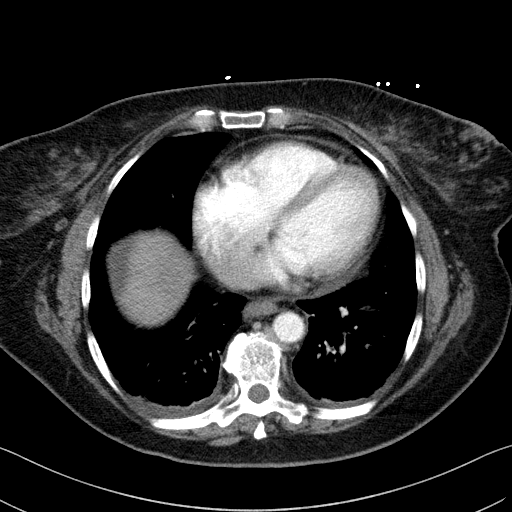
[im 21/55  lung]
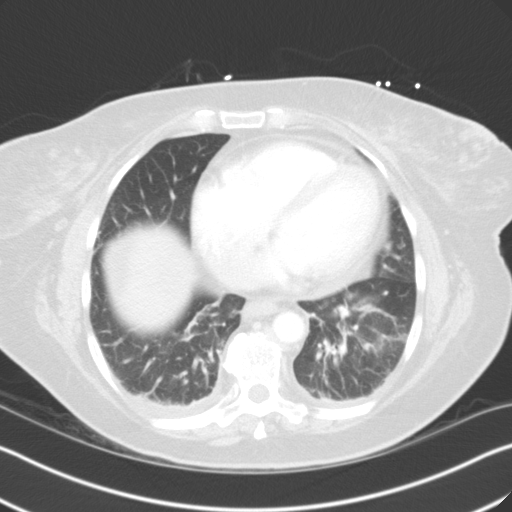
[im 23/55  lung]
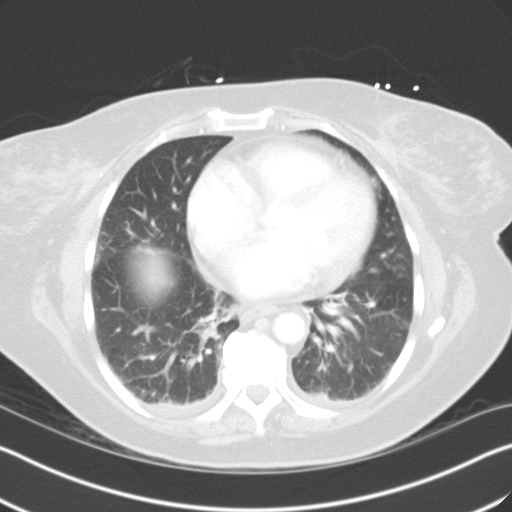
[im 26/55  lung]
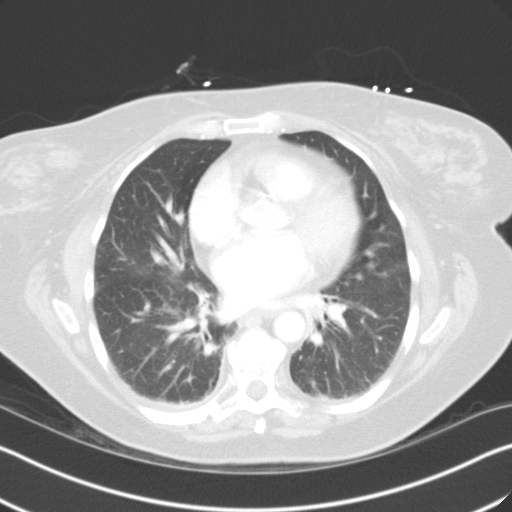
[im 29/55  lung]
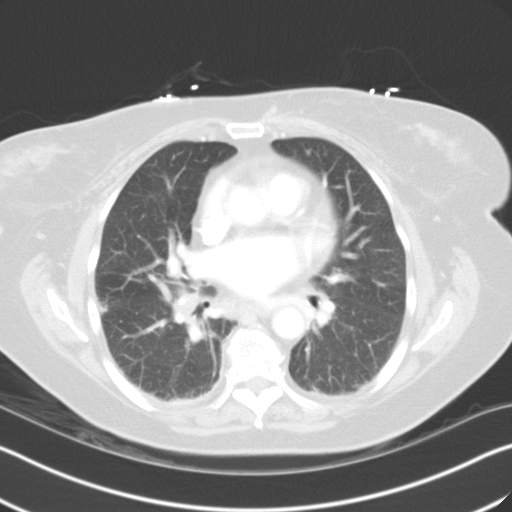
[im 31/55  mediastinal]
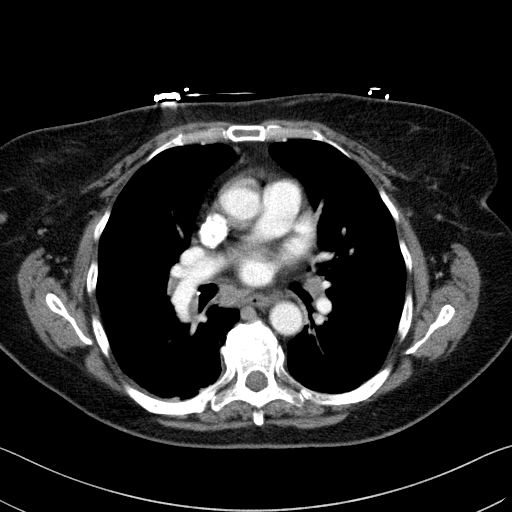
[im 31/55  lung]
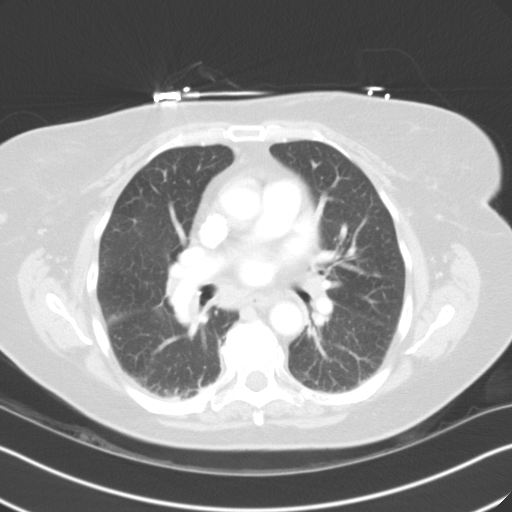
[im 35/55  lung]
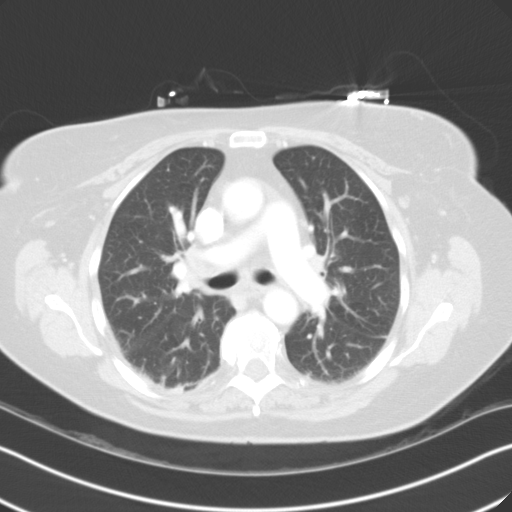
[im 39/55  lung]
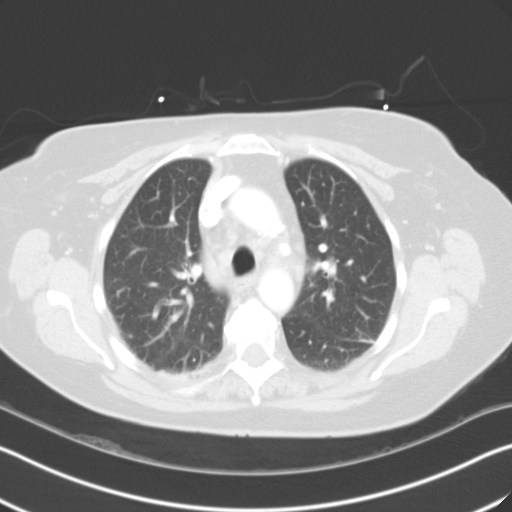
[im 43/55  lung]
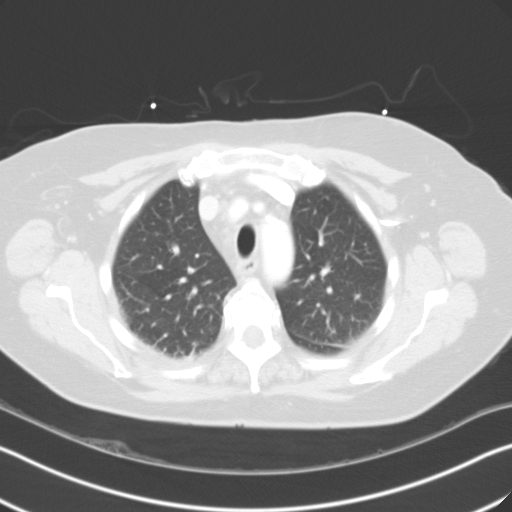
[im 45/55  mediastinal]
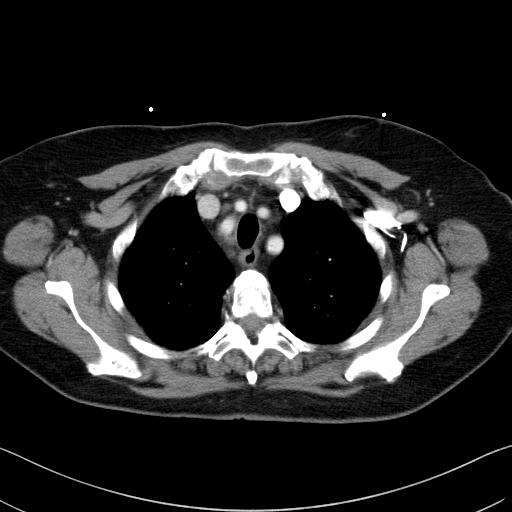
[im 45/55  lung]
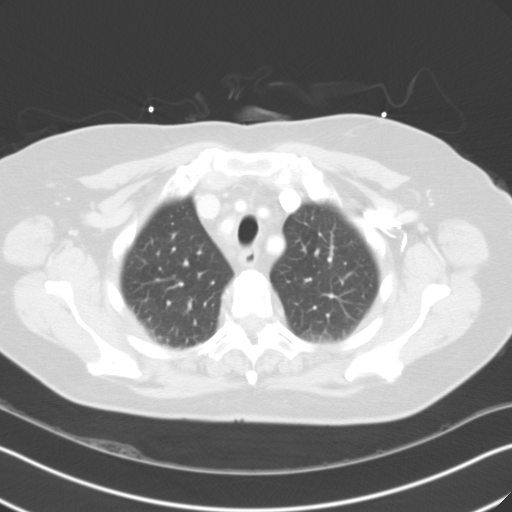
[im 49/55  lung]
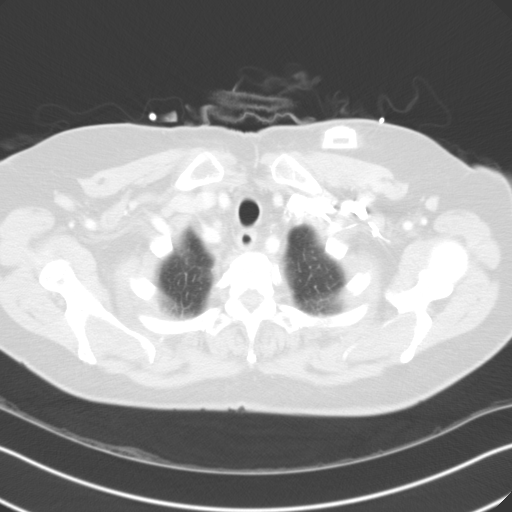
[im 53/55  lung]
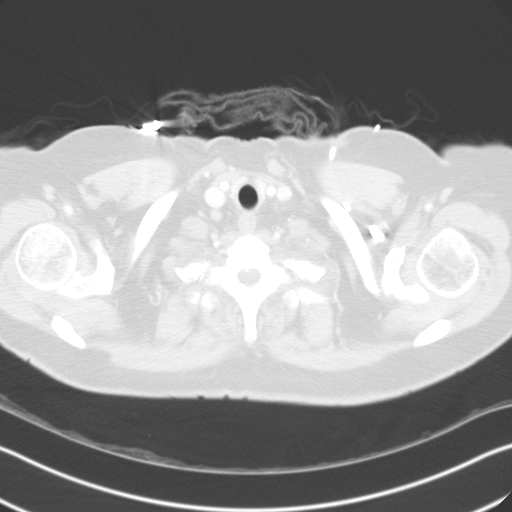

[15 of 32 positions shown; findings below may reference images not displayed]

FINDINGS: Interval development of interstitial opacities throughout both lungs
since the prior CT. Patchy airspace consolidation deep in the left
lower lobe. No localized airspace consolidation elsewhere. No
pulmonary parenchymal nodules to suggest metastatic disease. Very
small bilateral pleural effusions and associated passive atelectasis
posteriorly in the lower lobes.

Interval progression of mediastinal lymphadenopathy since the prior
CT. Index subcarinal node measures approximately 1.8 x 2.4 cm
(series 2, image 25), previously 1.0 x 1.8 cm. Mildly enlarged right
hilar lymph nodes and right intersegmental lymph nodes, also
progressive. No axillary lymphadenopathy.

Right subclavian Port-A-Cath tip at the cavoatrial junction. Heart
enlarged with left ventricular predominance, unchanged. No
pericardial effusion. Moderate atherosclerosis involving the
thoracic and upper abdominal aorta.

Small to moderate amount of upper abdominal ascites. Numerous
metastases within the visualized liver. Splenomegaly, with interval
increase in size of the spleen. Thrombosis involving the main portal
vein.

Bone window images demonstrate osseous demineralization and
degenerative changes throughout the thoracic spine.
IMPRESSION: 1. Interstitial opacities throughout both lungs. While this could
represent interstitial pulmonary edema, given the patient's history,
lymphangitic carcinomatosis is suspected.
2. Progressive mediastinal and right hilar lymphadenopathy.
3. Small bilateral pleural effusions and associated passive
atelectasis in the lower lobes. Patchy pneumonia M deep in the left
lower lobe.
4. Upper abdominal ascites.
5. Numerous liver metastases.
6. Portal vein thrombosis.
7. Splenomegaly, with interval increase in the spleen size since the
prior CT from October 2012.

## 2014-12-22 IMAGING — CR DG ABDOMEN 2V
3 series · 3 of 3 positions shown · non-contrast
Comparison: CT abdomen 04/16/2013

CLINICAL DATA: Abdominal pain.  Pancreatic cancer

EXAM:
ABDOMEN - 2 VIEW

[t abdomen supine (1 of 2)]
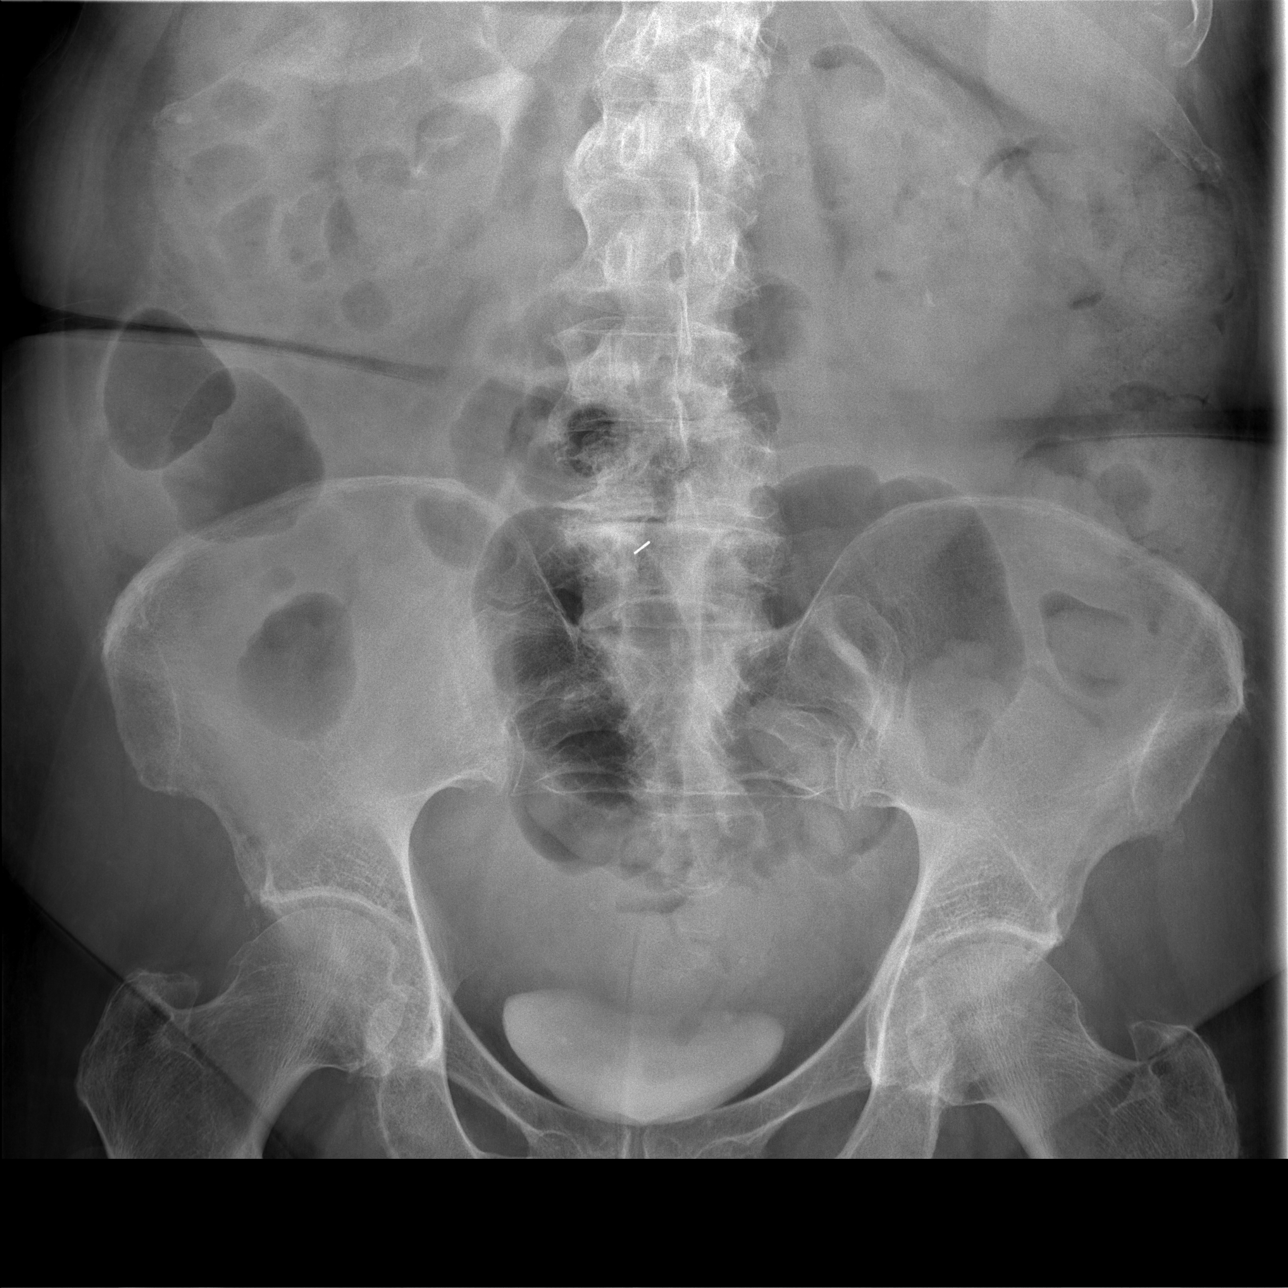

[t abdomen supine (2 of 2)]
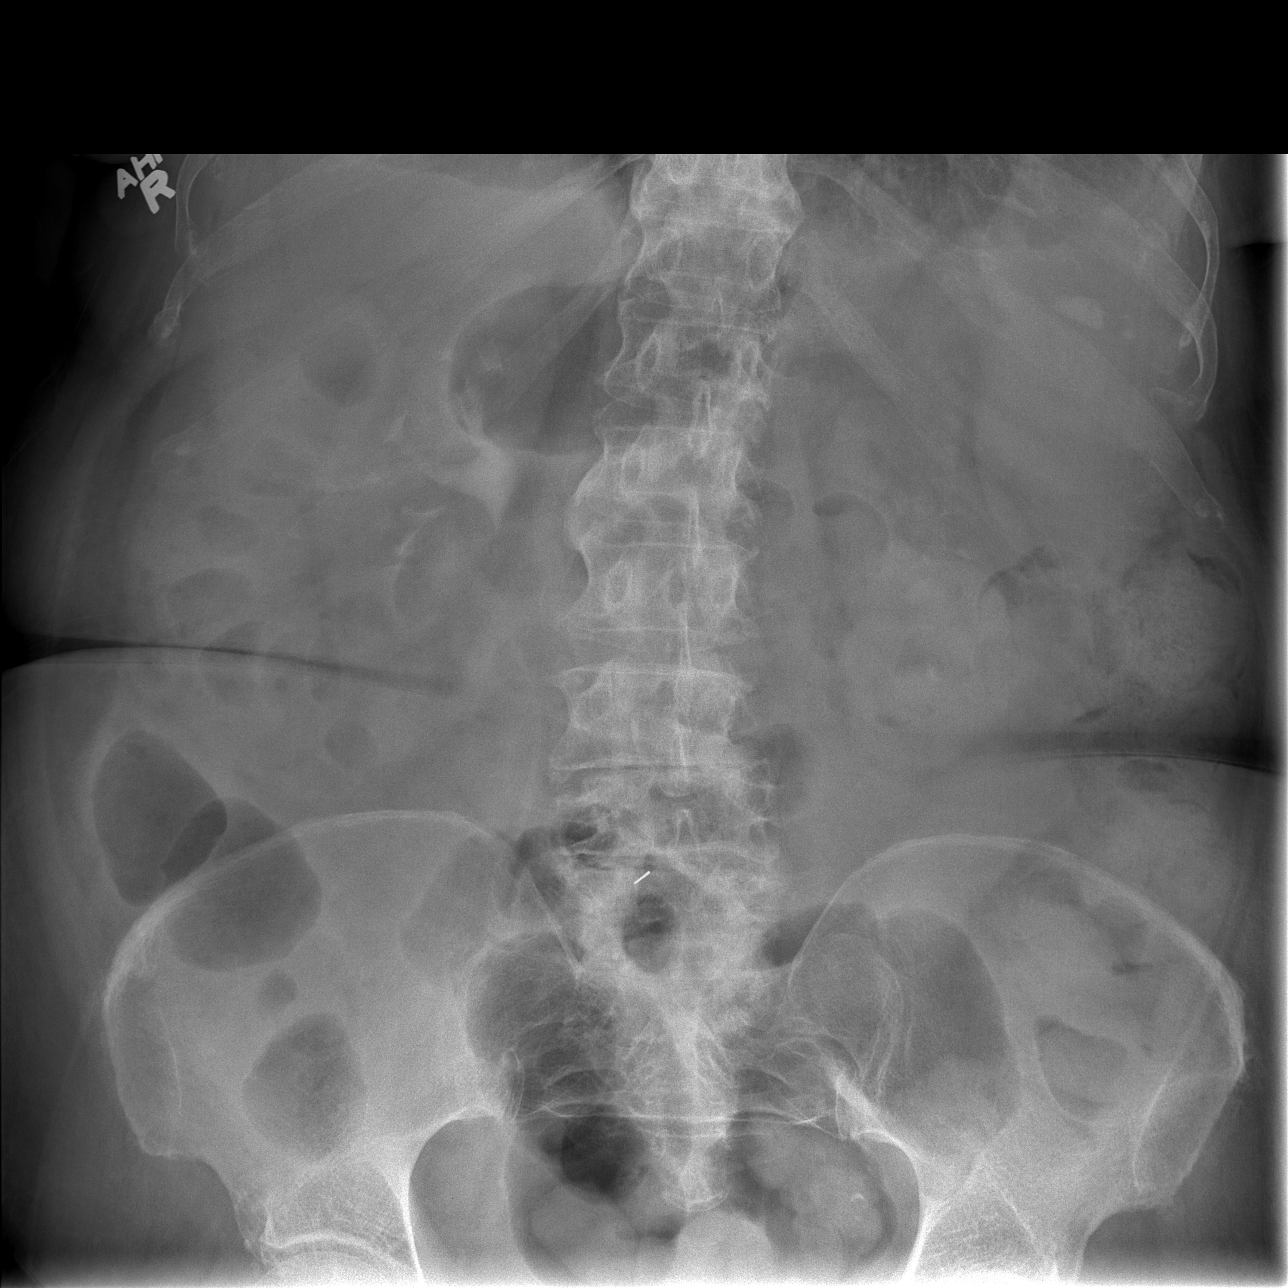

[w abdomen upright *]
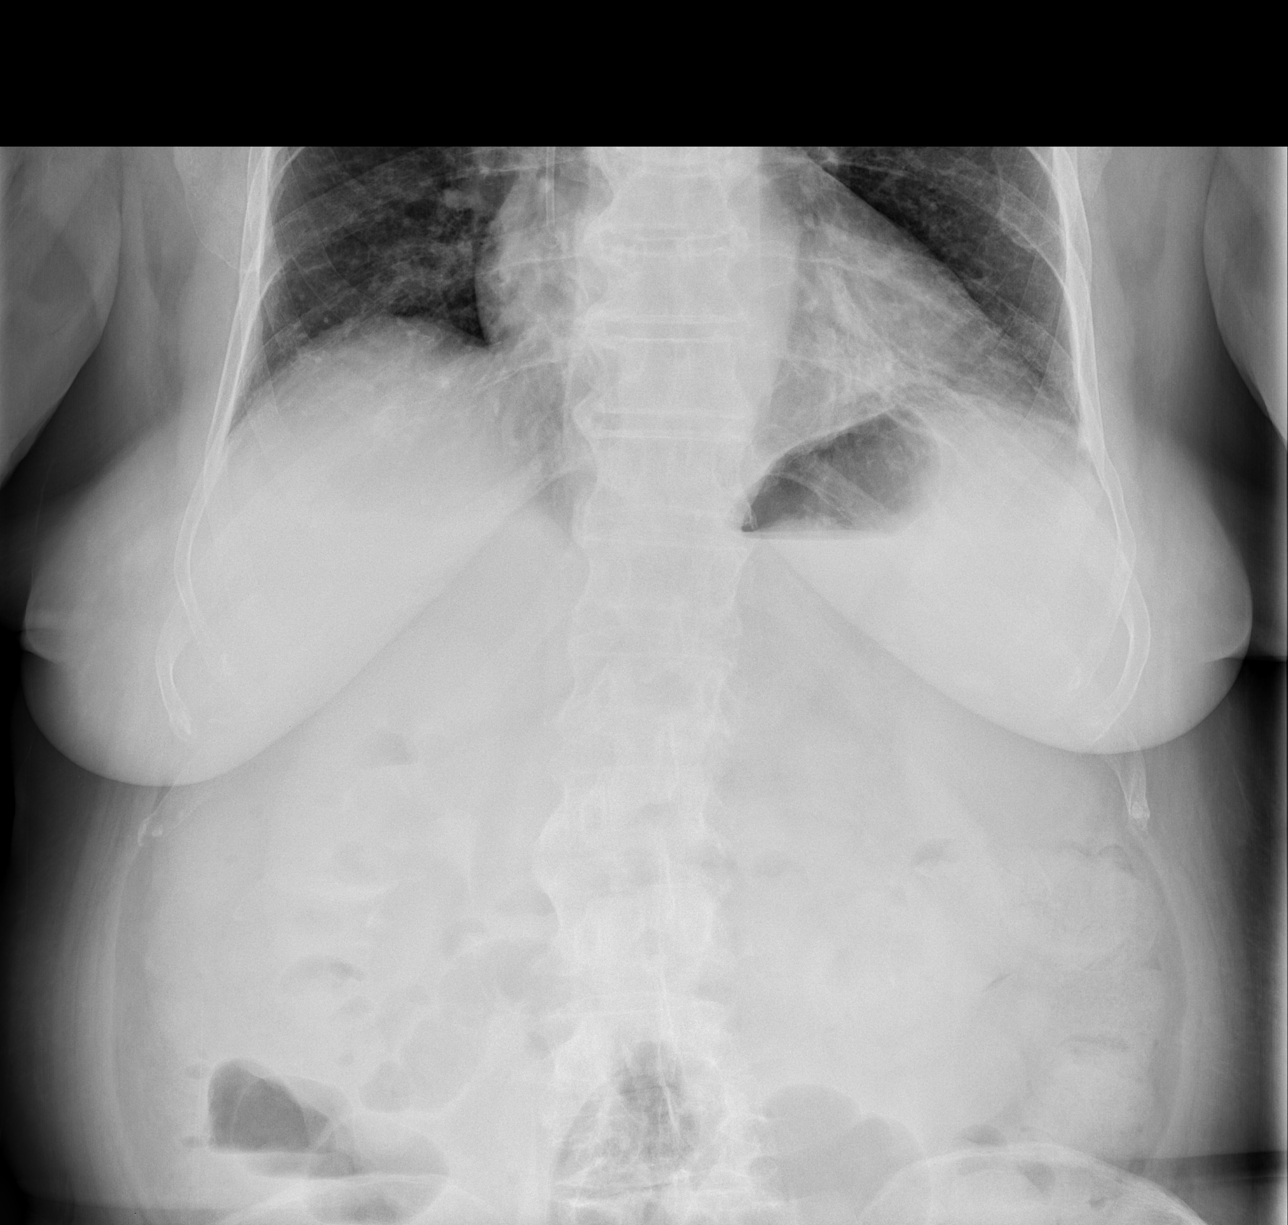

[3 of 3 positions shown; findings below may reference images not displayed]

FINDINGS: The bowel gas pattern is normal. There is no evidence of free air.
No radio-opaque calculi or other significant radiographic
abnormality is seen. Contrast is noted in the renal collecting
system from preceding chest CT.
IMPRESSION: Negative.

## 2015-01-16 IMAGING — US US ABDOMEN LIMITED
1 series · 4 of 4 positions shown · non-contrast
Comparison: Previous paracentesis from 05/28/2013

CLINICAL DATA: Metastatic pancreatic cancer. Abdominal distention
and pain. Evaluation for therapeutic paracentesis.

EXAM:
US ABDOMEN LIMITED

[Series 1: us abdomen limited · 0.27mm/px · 4 of 4 slices shown]
[im 1/4]
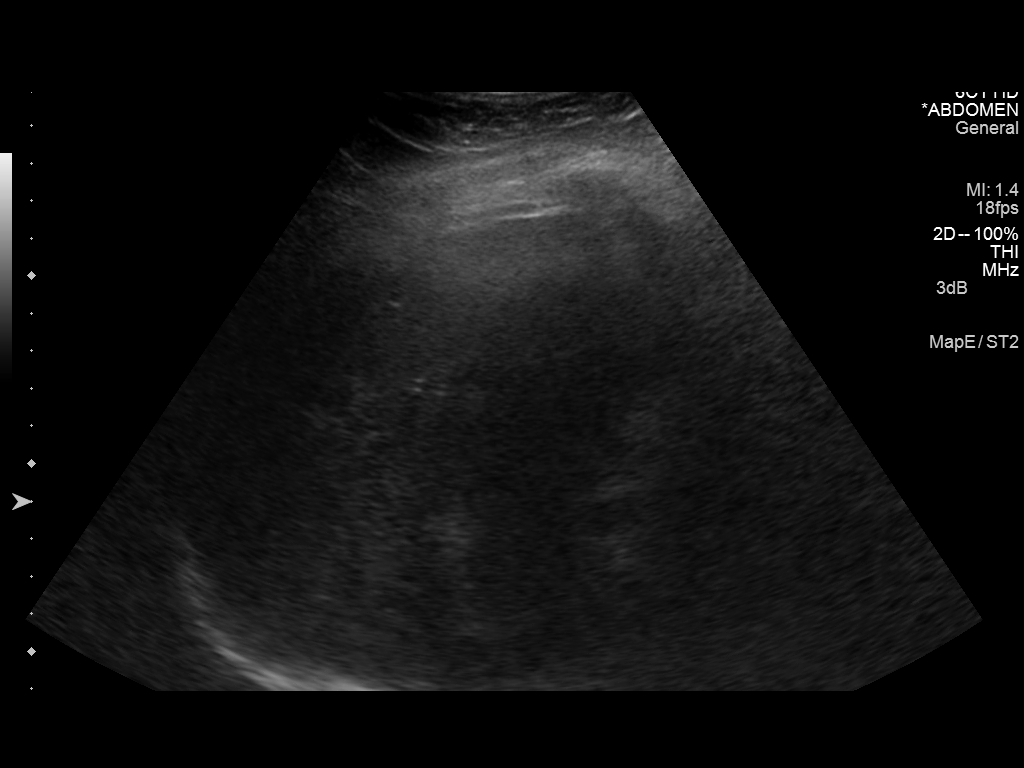
[im 2/4]
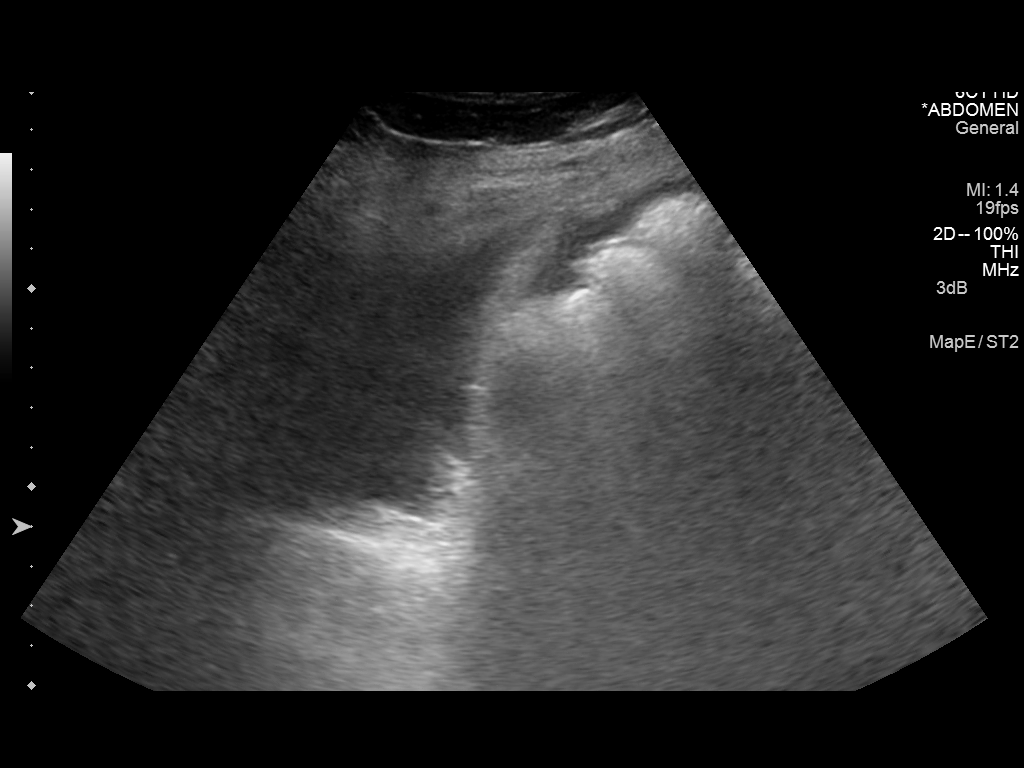
[im 3/4]
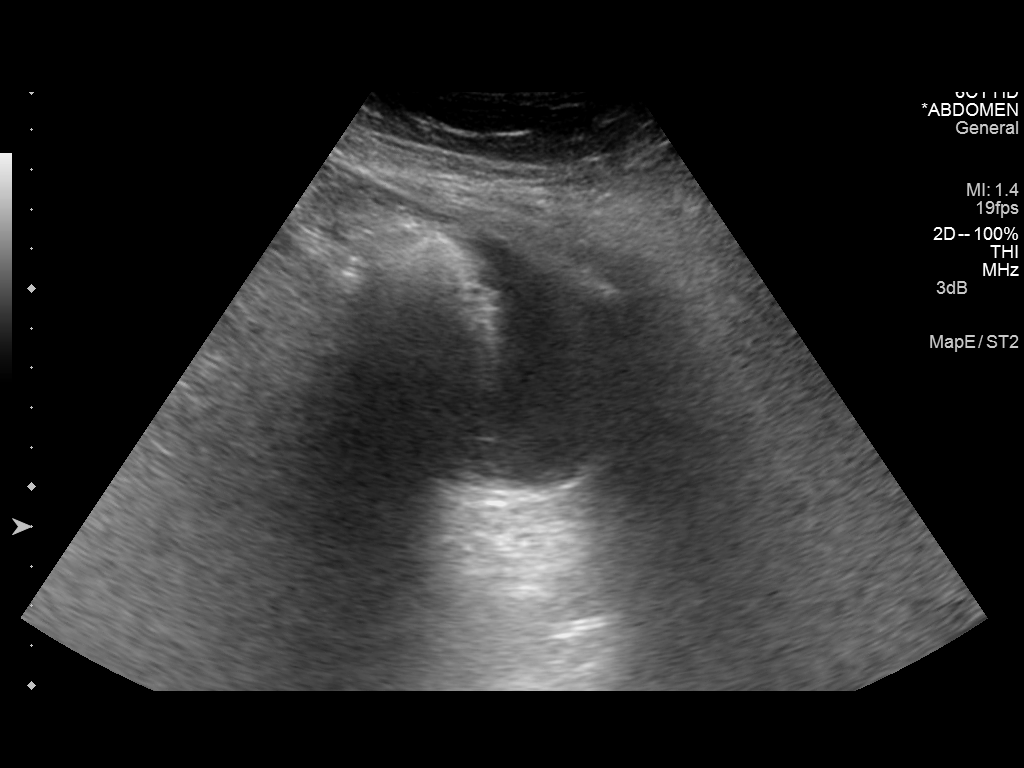
[im 4/4]
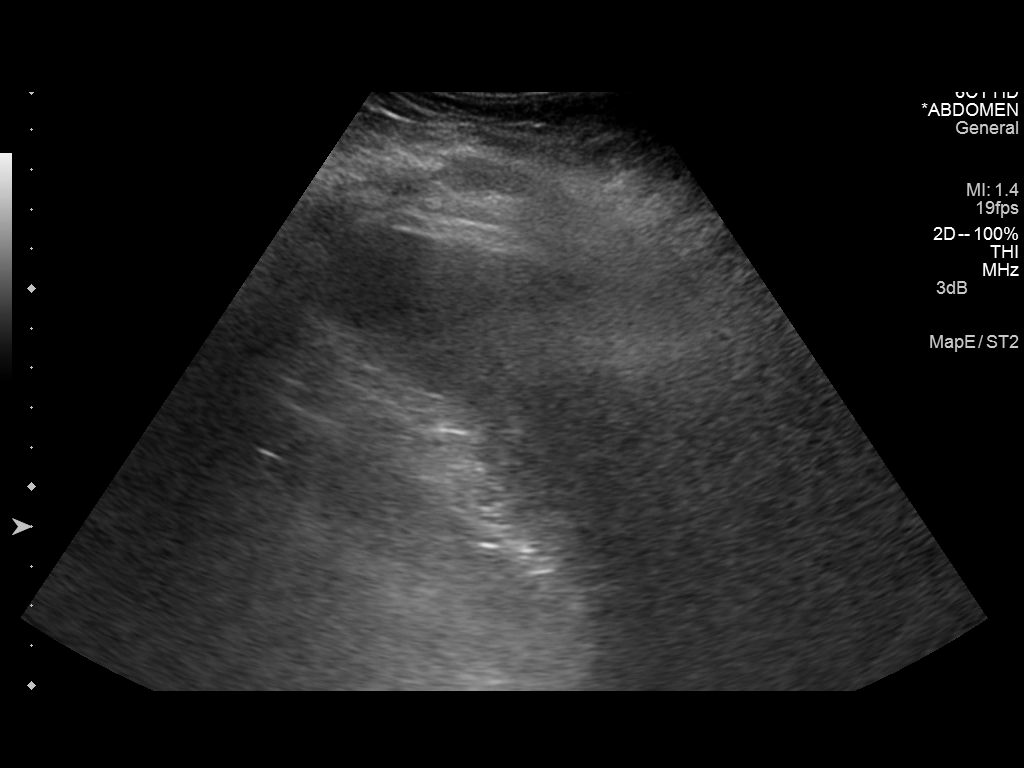

[4 of 4 positions shown; findings below may reference images not displayed]

FINDINGS: Limited ultrasound of the abdomen demonstrated a small volume of
ascites in the perihepatic region and in the deep right pelvis. This
is significantly less fluid than the previous exam. The patient
reported she did not have significant improvement in her pain level
after her last paracentesis. We did not feel there was sufficient
ascites for therapeutic purposes today. Procedure not performed.
IMPRESSION: Small volume ascites and not sufficient for therapeutic
paracentesis.
